# Patient Record
Sex: Female | Born: 1950 | Race: White | Hispanic: No | Marital: Single | Smoking: Former smoker
Health system: Southern US, Community
[De-identification: ages and names within clinical notes are randomized; demographics above are authoritative.]

## PROBLEM LIST (undated history)

## (undated) DIAGNOSIS — N183 Chronic kidney disease, stage 3 unspecified: Secondary | ICD-10-CM

## (undated) DIAGNOSIS — E119 Type 2 diabetes mellitus without complications: Secondary | ICD-10-CM

## (undated) DIAGNOSIS — M1990 Unspecified osteoarthritis, unspecified site: Secondary | ICD-10-CM

## (undated) DIAGNOSIS — D649 Anemia, unspecified: Secondary | ICD-10-CM

## (undated) DIAGNOSIS — C773 Secondary and unspecified malignant neoplasm of axilla and upper limb lymph nodes: Secondary | ICD-10-CM

## (undated) DIAGNOSIS — N951 Menopausal and female climacteric states: Secondary | ICD-10-CM

## (undated) DIAGNOSIS — I10 Essential (primary) hypertension: Secondary | ICD-10-CM

## (undated) DIAGNOSIS — C50919 Malignant neoplasm of unspecified site of unspecified female breast: Secondary | ICD-10-CM

## (undated) DIAGNOSIS — C4490 Unspecified malignant neoplasm of skin, unspecified: Secondary | ICD-10-CM

## (undated) HISTORY — DX: Secondary and unspecified malignant neoplasm of axilla and upper limb lymph nodes: C77.3

## (undated) HISTORY — DX: Essential (primary) hypertension: I10

## (undated) HISTORY — PX: TONSILLECTOMY AND ADENOIDECTOMY: SUR1326

## (undated) HISTORY — DX: Unspecified osteoarthritis, unspecified site: M19.90

## (undated) HISTORY — DX: Menopausal and female climacteric states: N95.1

## (undated) HISTORY — DX: Malignant neoplasm of unspecified site of unspecified female breast: C50.919

## (undated) HISTORY — DX: Unspecified malignant neoplasm of skin, unspecified: C44.90

---

## 2000-01-22 ENCOUNTER — Other Ambulatory Visit: Admission: RE | Admit: 2000-01-22 | Discharge: 2000-01-22 | Payer: Self-pay | Admitting: Obstetrics and Gynecology

## 2000-01-25 ENCOUNTER — Encounter: Admission: RE | Admit: 2000-01-25 | Discharge: 2000-01-25 | Payer: Self-pay | Admitting: Obstetrics and Gynecology

## 2000-01-25 ENCOUNTER — Encounter: Payer: Self-pay | Admitting: Obstetrics and Gynecology

## 2000-02-08 ENCOUNTER — Other Ambulatory Visit: Admission: RE | Admit: 2000-02-08 | Discharge: 2000-02-08 | Payer: Self-pay | Admitting: Obstetrics and Gynecology

## 2000-05-13 HISTORY — PX: MOHS SURGERY: SUR867

## 2004-05-13 DIAGNOSIS — I10 Essential (primary) hypertension: Secondary | ICD-10-CM

## 2004-05-13 HISTORY — DX: Essential (primary) hypertension: I10

## 2004-10-23 ENCOUNTER — Other Ambulatory Visit: Admission: RE | Admit: 2004-10-23 | Discharge: 2004-10-23 | Payer: Self-pay | Admitting: Obstetrics and Gynecology

## 2004-10-26 ENCOUNTER — Encounter: Admission: RE | Admit: 2004-10-26 | Discharge: 2004-10-26 | Payer: Self-pay | Admitting: *Deleted

## 2011-06-21 ENCOUNTER — Other Ambulatory Visit: Payer: Self-pay | Admitting: Physician Assistant

## 2011-07-29 ENCOUNTER — Other Ambulatory Visit: Payer: Self-pay | Admitting: Physician Assistant

## 2011-08-27 ENCOUNTER — Other Ambulatory Visit: Payer: Self-pay | Admitting: Physician Assistant

## 2011-11-05 ENCOUNTER — Encounter: Payer: Self-pay | Admitting: Physician Assistant

## 2011-11-05 ENCOUNTER — Ambulatory Visit (INDEPENDENT_AMBULATORY_CARE_PROVIDER_SITE_OTHER): Payer: BC Managed Care – PPO | Admitting: Physician Assistant

## 2011-11-05 VITALS — BP 156/89 | HR 93 | Temp 97.7°F | Resp 16 | Ht 63.5 in | Wt 187.6 lb

## 2011-11-05 DIAGNOSIS — E119 Type 2 diabetes mellitus without complications: Secondary | ICD-10-CM

## 2011-11-05 DIAGNOSIS — I10 Essential (primary) hypertension: Secondary | ICD-10-CM

## 2011-11-05 LAB — COMPREHENSIVE METABOLIC PANEL
ALT: 36 U/L — ABNORMAL HIGH (ref 0–35)
AST: 21 U/L (ref 0–37)
Albumin: 4.6 g/dL (ref 3.5–5.2)
Alkaline Phosphatase: 68 U/L (ref 39–117)
BUN: 16 mg/dL (ref 6–23)
Calcium: 9.3 mg/dL (ref 8.4–10.5)
Chloride: 105 mEq/L (ref 96–112)
Creat: 0.86 mg/dL (ref 0.50–1.10)
Potassium: 4.2 mEq/L (ref 3.5–5.3)

## 2011-11-05 LAB — CBC
MCHC: 35.2 g/dL (ref 30.0–36.0)
Platelets: 216 10*3/uL (ref 150–400)
RDW: 13.8 % (ref 11.5–15.5)
WBC: 6.1 10*3/uL (ref 4.0–10.5)

## 2011-11-05 LAB — POCT URINALYSIS DIPSTICK
Bilirubin, UA: NEGATIVE
Blood, UA: NEGATIVE
Glucose, UA: 500
Ketones, UA: 40
Nitrite, UA: NEGATIVE
Spec Grav, UA: 1.02

## 2011-11-05 LAB — LIPID PANEL
HDL: 32 mg/dL — ABNORMAL LOW (ref >39)
LDL Cholesterol: 143 mg/dL — ABNORMAL HIGH (ref 0–99)
Total CHOL/HDL Ratio: 7.5 Ratio

## 2011-11-05 LAB — GLUCOSE, POCT (MANUAL RESULT ENTRY): POC Glucose: 300 mg/dl — AB (ref 70–99)

## 2011-11-05 MED ORDER — METFORMIN HCL 1000 MG PO TABS: 1000.0000 mg | ORAL_TABLET | Freq: Two times a day (BID) | ORAL | Status: DC

## 2011-11-05 MED ORDER — SITAGLIPTIN PHOSPHATE 100 MG PO TABS: 100.0000 mg | ORAL_TABLET | Freq: Every day | ORAL | Status: DC

## 2011-11-05 MED ORDER — LISINOPRIL-HYDROCHLOROTHIAZIDE 20-25 MG PO TABS: 1.0000 | ORAL_TABLET | Freq: Every day | ORAL | Status: DC

## 2011-11-05 NOTE — Progress Notes (Signed)
  Subjective:    Patient ID: Gina Navarro, female    DOB: [birth year], 61 y.o.   MRN: [id number]  HPI Anai Lipson" comes in today for follow up.  It has been ~ 6 months since her last visit as she has had multiple life stressors.  She has had to care for her elderly mother who is 3 hours away and has been driving every week to be there.  Her mother was just diagnosed with liver CA as well. She states that she is completely out of her medications as she has tried multiple times to have them refilled but was told she needs to be seen.  She was so frustrated that she ran out and was unable to come in.  She was given an RX for her metformin for 1 month from a family friend who is a physician but her BS have been significantly elevated.  She feels light headed and "fuzzy" but has not had n/v, weight loss, diarrhea, abdominal pain.  She is very stressed and frustrated.    Review of Systems As noted in HPI, otherwise negative    Objective:   Physical Exam  Constitutional: She is oriented to person, place, and time. Vital signs are normal. She does not appear ill.  HENT:  Mouth/Throat: Oropharynx is clear and moist.  Eyes: No scleral icterus.  Cardiovascular: Normal rate and regular rhythm.   Pulmonary/Chest: Effort normal and breath sounds normal.  Abdominal: Normal appearance and bowel sounds are normal. There is no tenderness.  Lymphadenopathy:    She has no cervical adenopathy.  Neurological: She is alert and oriented to person, place, and time.  Skin: Skin is warm. She is not diaphoretic.        Assessment & Plan:  DM, uncontrolled HTN, uncontrolled  Check cmet, lipids, HGB A1C Restart meds Contact me with BS readings in 2 weeks.  To ER if symptoms change. Discussed [place] but agreed that she needs to be in a better situation for follow up before we start any new regimen. She will restart meds and can call me for phone follow up while she is busy caring for her mother.

## 2011-11-06 ENCOUNTER — Encounter: Payer: Self-pay | Admitting: Physician Assistant

## 2011-11-28 LAB — HM PAP SMEAR: HM Pap smear: NEGATIVE

## 2012-01-28 ENCOUNTER — Other Ambulatory Visit: Payer: Self-pay | Admitting: Physician Assistant

## 2012-01-30 ENCOUNTER — Other Ambulatory Visit: Payer: Self-pay | Admitting: Physician Assistant

## 2012-04-28 ENCOUNTER — Ambulatory Visit (INDEPENDENT_AMBULATORY_CARE_PROVIDER_SITE_OTHER): Payer: BC Managed Care – PPO | Admitting: Physician Assistant

## 2012-04-28 ENCOUNTER — Encounter: Payer: Self-pay | Admitting: Physician Assistant

## 2012-04-28 VITALS — BP 136/79 | HR 100 | Temp 98.1°F | Resp 18 | Ht 64.0 in | Wt 182.0 lb

## 2012-04-28 DIAGNOSIS — I10 Essential (primary) hypertension: Secondary | ICD-10-CM

## 2012-04-28 DIAGNOSIS — E119 Type 2 diabetes mellitus without complications: Secondary | ICD-10-CM

## 2012-04-28 DIAGNOSIS — E785 Hyperlipidemia, unspecified: Secondary | ICD-10-CM | POA: Insufficient documentation

## 2012-04-28 DIAGNOSIS — E1142 Type 2 diabetes mellitus with diabetic polyneuropathy: Secondary | ICD-10-CM | POA: Insufficient documentation

## 2012-04-28 LAB — COMPREHENSIVE METABOLIC PANEL
ALT: 58 U/L — ABNORMAL HIGH (ref 0–35)
AST: 29 U/L (ref 0–37)
Albumin: 4.5 g/dL (ref 3.5–5.2)
Calcium: 9.5 mg/dL (ref 8.4–10.5)
Chloride: 101 mEq/L (ref 96–112)
Potassium: 4.6 mEq/L (ref 3.5–5.3)
Sodium: 136 mEq/L (ref 135–145)
Total Protein: 7 g/dL (ref 6.0–8.3)

## 2012-04-28 LAB — POCT GLYCOSYLATED HEMOGLOBIN (HGB A1C): Hemoglobin A1C: 10.8

## 2012-04-28 LAB — GLUCOSE, POCT (MANUAL RESULT ENTRY): POC Glucose: 218 mg/dl — AB (ref 70–99)

## 2012-04-28 LAB — LIPID PANEL: Total CHOL/HDL Ratio: 5.6 Ratio

## 2012-04-28 MED ORDER — METFORMIN HCL 1000 MG PO TABS: 1000.0000 mg | ORAL_TABLET | Freq: Two times a day (BID) | ORAL | Status: DC

## 2012-04-28 MED ORDER — COLESEVELAM HCL 625 MG PO TABS: 1875.0000 mg | ORAL_TABLET | Freq: Two times a day (BID) | ORAL | Status: DC

## 2012-04-28 MED ORDER — SITAGLIPTIN PHOSPHATE 100 MG PO TABS: 100.0000 mg | ORAL_TABLET | Freq: Every day | ORAL | Status: DC

## 2012-04-28 MED ORDER — LISINOPRIL-HYDROCHLOROTHIAZIDE 20-25 MG PO TABS: 1.0000 | ORAL_TABLET | Freq: Every day | ORAL | Status: DC

## 2012-04-28 NOTE — Patient Instructions (Addendum)
Healthy eating and regular exercise are key.  So is taking your medications daily as prescribed.

## 2012-04-28 NOTE — Assessment & Plan Note (Signed)
Uncontrolled. Add Welchol to address both glucose and lipids (intolerant to statins).  If not to goal in 3 months, plan D/C Januvia and starting Victoza. Stressed the importance of lifestyle changes.

## 2012-04-28 NOTE — Progress Notes (Signed)
Subjective:    Patient ID: Gina Navarro, female    DOB: Sep 13, 1950, 61 y.o.   MRN: [id number]  HPI This 61 y.o. female presents for evaluation of her chronic medical problems: Patient Active Problem List  Diagnosis  . Type II or unspecified type diabetes mellitus without mention of complication, not stated as uncontrolled  . HTN (hypertension)  . Hyperlipidemia LDL goal <70   She hasn't been making healthy eating choices or getting much exercise, though knowing she needs to.  Her mother (adoptive) died this fall, and during her decline, the patient was too busy caring for her to make her own health a priority.  Now that she has more time, she is re-motivated to make some healthy changes.  She started metformin reluctantly, and notes a 50 point improvement in her glucose readings since starting Januvia.  She continues to refuse anything that may cause weight gain, including insulin, but is not opposed to an injectable other than insulin.  Frequency of home glucose monitoring: QD-BID; fasting 200's. Sees a dentist every few years, eye specialist annually (03-06-12). Checks feet daily. Is current with influenza vaccine. Is current with pneumococcal vaccine.  Past Medical History  Diagnosis Date  . Hypertension   . Diabetes mellitus without complication   . Arthritis     Past Surgical History  Procedure Date  . Tonsillectomy and adenoidectomy   . Mohs surgery     chin x 2    Prior to Admission medications   Medication Sig Start Date End Date Taking? Authorizing Provider  lisinopril-hydrochlorothiazide (PRINZIDE,ZESTORETIC) 20-25 MG per tablet Take 1 tablet by mouth daily. 11/05/11  Yes Pattricia Boss, PA-C  metFORMIN (GLUCOPHAGE) 1000 MG tablet TAKE 1 TABLET (1,000 MG TOTAL) BY MOUTH 2 (TWO) TIMES DAILY WITH A MEAL. 01/28/12  Yes Sarah Harvie Bridge, PA-C  sitaGLIPtin (JANUVIA) 100 MG tablet Take 1 tablet (100 mg total) by mouth daily. 11/05/11  Yes Pattricia Boss, PA-C    Allergies   Allergen Reactions  . Statins Other (See Comments)    MYALGIAS with Crestor, Lipitor, Zocor    History   Social History  . Marital Status: Single    Spouse Name: n/a    Number of Children: 0  . Years of Education: 17   Occupational History  . Psychologist (PhD)    Social History Main Topics  . Smoking status: Former Games developer  . Smokeless tobacco: Never Used  . Alcohol Use: No  . Drug Use: No  . Sexually Active: Yes -- Female partner(s)   Other Topics Concern  . Not on file   Social History Narrative   Adoptive mother died in 2012/03/06. Lives alone.    Family History  Problem Relation Age of Onset  . Adopted: Yes  . Family history unknown: Yes    Review of Systems Denies chest pain, shortness of breath, HA,  vision change, nausea, vomiting, diarrhea, constipation, melena, hematochezia, dysuria, increased hunger or thirst, unintentional weight change, unexplained myalgias or arthralgias, rash.  Increased urinary urgency/frequency and dizziness when glucose is elevated.    Objective:   Physical Exam Blood pressure 136/79, pulse 100, temperature 98.1 F (36.7 C), resp. rate 18, height 5\' 4"  (1.626 m), weight 182 lb (82.555 kg). Body mass index is 31.24 kg/(m^2). Well-developed, well nourished WF who is awake, alert and oriented, in NAD. HEENT: [place]/AT, sclera and conjunctiva are clear.   Neck: supple, non-tender, no lymphadenopathy, thyromegaly. Heart: RRR, no murmur Lungs: normal effort, CTA Extremities: no cyanosis,  clubbing or edema.  See diabetic foot exam. Skin: warm and dry without rash. Psychologic: good mood and appropriate affect, normal speech and behavior.  Results for orders placed in visit on 04/28/12  GLUCOSE, POCT (MANUAL RESULT ENTRY)      Component Value Range   POC Glucose 218 (*) 70 - 99 mg/dl  POCT GLYCOSYLATED HEMOGLOBIN (HGB A1C)      Component Value Range   Hemoglobin A1C 10.8        Assessment & Plan:   1. Type II or unspecified type  diabetes mellitus without mention of complication, not stated as uncontrolled  POCT glucose (manual entry), POCT glycosylated hemoglobin (Hb A1C), Comprehensive metabolic panel, Microalbumin, urine, sitaGLIPtin (JANUVIA) 100 MG tablet, metFORMIN (GLUCOPHAGE) 1000 MG tablet, colesevelam (WELCHOL) 625 MG tablet  2. HTN (hypertension)  lisinopril-hydrochlorothiazide (PRINZIDE,ZESTORETIC) 20-25 MG per tablet  3. Hyperlipidemia LDL goal <70  Lipid panel, colesevelam (WELCHOL) 625 MG tablet   RTC 3 months.  Additional health maintenance needed: colonoscopy, Zostavax.

## 2012-04-29 LAB — MICROALBUMIN, URINE: Microalb, Ur: 2.21 mg/dL — ABNORMAL HIGH (ref 0.00–1.89)

## 2012-05-05 ENCOUNTER — Encounter: Payer: Self-pay | Admitting: Physician Assistant

## 2012-08-04 ENCOUNTER — Encounter: Payer: Self-pay | Admitting: Physician Assistant

## 2012-08-04 ENCOUNTER — Ambulatory Visit (INDEPENDENT_AMBULATORY_CARE_PROVIDER_SITE_OTHER): Payer: BC Managed Care – PPO | Admitting: Physician Assistant

## 2012-08-04 VITALS — BP 121/78 | HR 89 | Temp 98.0°F | Resp 16 | Ht 63.0 in | Wt 186.0 lb

## 2012-08-04 DIAGNOSIS — I10 Essential (primary) hypertension: Secondary | ICD-10-CM

## 2012-08-04 DIAGNOSIS — E785 Hyperlipidemia, unspecified: Secondary | ICD-10-CM

## 2012-08-04 DIAGNOSIS — Z1211 Encounter for screening for malignant neoplasm of colon: Secondary | ICD-10-CM

## 2012-08-04 DIAGNOSIS — E119 Type 2 diabetes mellitus without complications: Secondary | ICD-10-CM

## 2012-08-04 LAB — LIPID PANEL
Cholesterol: 193 mg/dL (ref 0–200)
HDL: 38 mg/dL — ABNORMAL LOW (ref >39)
LDL Cholesterol: 91 mg/dL (ref 0–99)
Triglycerides: 321 mg/dL — ABNORMAL HIGH (ref <150)
VLDL: 64 mg/dL — ABNORMAL HIGH (ref 0–40)

## 2012-08-04 LAB — COMPREHENSIVE METABOLIC PANEL
Alkaline Phosphatase: 45 U/L (ref 39–117)
BUN: 19 mg/dL (ref 6–23)
CO2: 23 mEq/L (ref 19–32)
Creat: 0.9 mg/dL (ref 0.50–1.10)
Glucose, Bld: 180 mg/dL — ABNORMAL HIGH (ref 70–99)
Sodium: 138 mEq/L (ref 135–145)
Total Bilirubin: 1 mg/dL (ref 0.3–1.2)
Total Protein: 6.7 g/dL (ref 6.0–8.3)

## 2012-08-04 LAB — GLUCOSE, POCT (MANUAL RESULT ENTRY): POC Glucose: 166 mg/dl — AB (ref 70–99)

## 2012-08-04 MED ORDER — LIRAGLUTIDE 18 MG/3ML ~~LOC~~ SOLN: 0.6000 mg | Freq: Every day | SUBCUTANEOUS | Status: DC

## 2012-08-04 NOTE — Patient Instructions (Signed)
Keep up the great work.  Start walking again.  Make healthy eating choices.

## 2012-08-04 NOTE — Assessment & Plan Note (Signed)
Much improved.  A1C down from 10.8 to 8.3.  Add Victoza.  Consider reduce or D/C Januvia, and then reduce or D/C Welchol (due to constipation and reported fluid retention).

## 2012-08-04 NOTE — Progress Notes (Signed)
  Subjective:    Patient ID: Gina Navarro, female    DOB: 1950-09-14, 62 y.o.   MRN: [id number]  HPI This 62 y.o. female presents for evaluation of DM type 2, HTN, and hyperlipidemia. Is pleased with the results of the Welchol, except that it's also caused constipation and fluid retention. Using fiber pills and cranberry extract with improvement.  Frequency of home glucose monitoring: Daily.  Runs high 100's, down from high 200's pre-Welchol. Sees a dentist Q6 months, eye specialist annually. Checks feet daily. Is current with influenza vaccine. Is current with pneumococcal vaccine.   Review of Systems Denies chest pain, shortness of breath, HA, dizziness, vision change, nausea, vomiting, diarrhea, melena, hematochezia, dysuria, increased urinary urgency or frequency, increased hunger or thirst, unintentional weight change, unexplained myalgias or arthralgias, rash.     Objective:   Physical Exam Blood pressure 121/78, pulse 89, temperature 98 F (36.7 C), temperature source Oral, resp. rate 16, height 5\' 3"  (1.6 m), weight 186 lb (84.369 kg), SpO2 98.00%. Body mass index is 32.96 kg/(m^2). Well-developed, well nourished WF who is awake, alert and oriented, in NAD. HEENT: [place]/AT, slera and conjunctiva are clear.   Neck: supple, non-tender, no lymphadenopathy, thyromegaly. Heart: RRR, no murmur Lungs: normal effort, CTA Extremities: no cyanosis, clubbing or edema. Skin: warm and dry without rash. Psychologic: good mood and appropriate affect, normal speech and behavior.    Results for orders placed in visit on 08/04/12  GLUCOSE, POCT (MANUAL RESULT ENTRY)      Result Value Range   POC Glucose 166 (*) 70 - 99 mg/dl  POCT GLYCOSYLATED HEMOGLOBIN (HGB A1C)      Result Value Range   Hemoglobin A1C 8.3         Assessment & Plan:  Type II or unspecified type diabetes mellitus without mention of complication, not stated as uncontrolled - Much improved .Plan: POCT glucose (manual  entry), POCT glycosylated hemoglobin (Hb A1C), Comprehensive metabolic panel, Liraglutide (VICTOZA) 18 MG/3ML SOLN injection. Hope to see continued improvement.  Will plan to decrease (and D/C) Januvia, but elected not to stop it now, due to adverse effects during initiation and hopes of avoiding the need to stop-start it and revisit those.  Also consider reducing or D/C Welchol, depending on the effect.  She's reminded to resume her former healthy eating and exercise habits.  Hyperlipidemia LDL goal <70 - Plan: Lipid panel  HTN (hypertension) - stable.  Plan: continue current treatment.  Screening for colon cancer - Plan: Ambulatory referral to Gastroenterology

## 2012-08-05 ENCOUNTER — Encounter: Payer: Self-pay | Admitting: Internal Medicine

## 2012-08-30 ENCOUNTER — Other Ambulatory Visit: Payer: Self-pay | Admitting: Physician Assistant

## 2012-08-31 ENCOUNTER — Encounter: Payer: Self-pay | Admitting: Radiology

## 2012-09-24 ENCOUNTER — Encounter: Payer: Self-pay | Admitting: Internal Medicine

## 2012-09-26 ENCOUNTER — Other Ambulatory Visit: Payer: Self-pay | Admitting: Physician Assistant

## 2012-09-29 ENCOUNTER — Other Ambulatory Visit: Payer: Self-pay | Admitting: Physician Assistant

## 2012-10-08 ENCOUNTER — Other Ambulatory Visit: Payer: Self-pay | Admitting: Physician Assistant

## 2012-10-09 ENCOUNTER — Telehealth: Payer: Self-pay | Admitting: Radiology

## 2012-10-09 NOTE — Telephone Encounter (Signed)
Welchol is costly $300 there is generic alternative Questran which would be more cost effective. Please advise.

## 2012-10-11 MED ORDER — CHOLESTYRAMINE 4 GM/DOSE PO POWD: 4.0000 g | Freq: Three times a day (TID) | ORAL | Status: DC

## 2012-10-11 NOTE — Telephone Encounter (Signed)
Rx changed to Questran.  Meds ordered this encounter  Medications  . cholestyramine (QUESTRAN) 4 GM/DOSE powder    Sig: Take 1 packet (4 g total) by mouth 3 (three) times daily with meals.    Dispense:  378 g    Refill:  2    Order Specific Question:  Supervising Provider    Answer:  DOOLITTLE, ROBERT P [[id number]]

## 2012-10-12 NOTE — Telephone Encounter (Signed)
lmom that rx was changed and sent to pharmacy

## 2012-11-10 ENCOUNTER — Ambulatory Visit (INDEPENDENT_AMBULATORY_CARE_PROVIDER_SITE_OTHER): Payer: BC Managed Care – PPO | Admitting: Physician Assistant

## 2012-11-10 ENCOUNTER — Encounter: Payer: Self-pay | Admitting: Physician Assistant

## 2012-11-10 VITALS — BP 138/72 | HR 105 | Temp 98.2°F | Resp 16 | Ht 63.5 in | Wt 172.2 lb

## 2012-11-10 DIAGNOSIS — E119 Type 2 diabetes mellitus without complications: Secondary | ICD-10-CM

## 2012-11-10 DIAGNOSIS — E785 Hyperlipidemia, unspecified: Secondary | ICD-10-CM

## 2012-11-10 DIAGNOSIS — I10 Essential (primary) hypertension: Secondary | ICD-10-CM

## 2012-11-10 LAB — COMPREHENSIVE METABOLIC PANEL
ALT: 46 U/L — ABNORMAL HIGH (ref 0–35)
AST: 25 U/L (ref 0–37)
Albumin: 4.8 g/dL (ref 3.5–5.2)
BUN: 17 mg/dL (ref 6–23)
Calcium: 9.9 mg/dL (ref 8.4–10.5)
Chloride: 104 mEq/L (ref 96–112)
Potassium: 4.3 mEq/L (ref 3.5–5.3)
Sodium: 138 mEq/L (ref 135–145)
Total Protein: 7.1 g/dL (ref 6.0–8.3)

## 2012-11-10 LAB — GLUCOSE, POCT (MANUAL RESULT ENTRY): POC Glucose: 100 mg/dl — AB (ref 70–99)

## 2012-11-10 LAB — POCT GLYCOSYLATED HEMOGLOBIN (HGB A1C): Hemoglobin A1C: 6

## 2012-11-10 MED ORDER — GLUCOSE BLOOD VI STRP: ORAL_STRIP | Status: DC

## 2012-11-10 MED ORDER — LIRAGLUTIDE 18 MG/3ML ~~LOC~~ SOPN: 1.2000 mg | PEN_INJECTOR | Freq: Every day | SUBCUTANEOUS | Status: DC

## 2012-11-10 NOTE — Telephone Encounter (Signed)
Pended the needles not sure how many review and sign

## 2012-11-10 NOTE — Patient Instructions (Addendum)
STOP the Januvia. INCREASE the Victoza to 1.2 mg daily. Contact me with the information about the pen needles and I will send a prescription to the pharmacy for you.  I will contact you with your lab results as soon as they are available.   If you have not heard from me in 2 weeks, please contact me.  The fastest way to get your results is to register for My Chart (see the instructions on the last page of this printout).

## 2012-11-10 NOTE — Progress Notes (Signed)
  Subjective:    Patient ID: Gina Navarro, female    DOB: [birth year], 62 y.o.   MRN: [id number]  HPI  This 62 y.o. female presents for evaluation of DM, HTN, hyperlipidemia.  Changed from Welchol to [place] (pharmacy benefit coverage) but hasn't started it yet. Recall that she had significant constipation on the [facility], and so enjoyed a return to normal bowel habits, she didn't want to start the Questran.  Is tolerating the Victoza well, and would like to increase the dose, in hopes of reducing/stopping the Januvia.  Frequency of home glucose monitoring: QAM 120-140's Sees a dentist Q6 months, eye specialist annually. Checks feet daily. Is current with influenza vaccine. Is current with pneumococcal vaccine.   Review of Systems Denies chest pain, shortness of breath, HA, dizziness, vision change, nausea, vomiting, diarrhea, constipation, melena, hematochezia, dysuria, increased hunger or thirst, unintentional weight change, unexplained myalgias or arthralgias, rash. Some longstanding urinary urgency and frequency, not increased.  Some cramping of the LEFT foot during the day x 4 weeks.  Occurs at rest, when she's sitting in her recliner.  Usually around the toes, sometimes up along the arch.  Rubbing on it seems to help. Eating a spoonful of mustard usually helps.  Occurs every few days.  Last 1-2 minutes.     Objective:   Physical Exam Blood pressure 138/72, pulse 105, temperature 98.2 F (36.8 C), temperature source Oral, resp. rate 16, height 5' 3.5" (1.613 m), weight 172 lb 3.2 oz (78.109 kg), SpO2 98.00%. Body mass index is 30.02 kg/(m^2). Well-developed, well nourished WF who is awake, alert and oriented, in NAD. HEENT: [place]/AT, sclera and conjunctiva are clear.   Neck: supple, non-tender, no lymphadenopathy, thyromegaly. Heart: RRR, no murmur Lungs: normal effort, CTA Abdomen: normo-active bowel sounds, supple, non-tender, no mass or organomegaly. Extremities: no cyanosis,  clubbing or edema. Skin: warm and dry without rash. Psychologic: good mood and appropriate affect, normal speech and behavior.  See DM foot exam  Results for orders placed in visit on 11/10/12  GLUCOSE, POCT (MANUAL RESULT ENTRY)      Result Value Range   POC Glucose 100 (*) 70 - 99 mg/dl  POCT GLYCOSYLATED HEMOGLOBIN (HGB A1C)      Result Value Range   Hemoglobin A1C 6.0         Assessment & Plan:  Type II or unspecified type diabetes mellitus without mention of complication, not stated as uncontrolled - Plan: glucose blood test strip, POCT glucose (manual entry), POCT glycosylated hemoglobin (Hb A1C), Comprehensive metabolic panel, Microalbumin, urine, Liraglutide (VICTOZA) 18 MG/3ML SOPN  Hyperlipidemia LDL goal <70 - Plan: Lipid panel  HTN (hypertension) - controlled.  Continue current treatment.   RTC 3 months, sooner if needed.  Fernande Bras, PA-C Physician Assistant-Certified [facility]

## 2012-11-11 LAB — MICROALBUMIN, URINE: Microalb, Ur: 1.3 mg/dL (ref 0.00–1.89)

## 2012-11-12 MED ORDER — INSULIN PEN NEEDLE 31G X 8 MM MISC: Status: DC

## 2012-11-25 ENCOUNTER — Other Ambulatory Visit: Payer: Self-pay | Admitting: Physician Assistant

## 2012-11-26 ENCOUNTER — Encounter: Payer: Self-pay | Admitting: *Deleted

## 2012-12-01 ENCOUNTER — Ambulatory Visit (INDEPENDENT_AMBULATORY_CARE_PROVIDER_SITE_OTHER): Payer: BC Managed Care – PPO | Admitting: Nurse Practitioner

## 2012-12-01 ENCOUNTER — Encounter: Payer: Self-pay | Admitting: Nurse Practitioner

## 2012-12-01 VITALS — BP 114/66 | HR 72 | Resp 14 | Ht 63.75 in | Wt 171.2 lb

## 2012-12-01 DIAGNOSIS — Z01419 Encounter for gynecological examination (general) (routine) without abnormal findings: Secondary | ICD-10-CM

## 2012-12-01 DIAGNOSIS — Z0000 Encounter for general adult medical examination without abnormal findings: Secondary | ICD-10-CM

## 2012-12-01 NOTE — Patient Instructions (Addendum)

## 2012-12-01 NOTE — Progress Notes (Signed)
62 y.o. G0P0 Single Caucasian Fe here for annual exam.  No new health diagnosis.  Now on Victoza and has lost over 10 lbs.  No LMP recorded. Patient is postmenopausal.          Sexually active: yes  The current method of family planning is post menopausal status.    Exercising: no  The patient does not participate in regular exercise at present. Smoker:  no  Health Maintenance: Pap:  11/28/2011  Normal with negative HR HPV MMG:  03/25/2012 normal Colonoscopy:  never BMD:   never TDaP:  2009 Labs: PCP maintains all labs and urine.    reports that she has quit smoking. She has never used smokeless tobacco. She reports that she does not drink alcohol or use illicit drugs.  Past Medical History  Diagnosis Date  . Hypertension   . Diabetes mellitus without complication   . Arthritis   . Menopausal state     Past Surgical History  Procedure Laterality Date  . Tonsillectomy and adenoidectomy    . Mohs surgery      chin x 2    Current Outpatient Prescriptions  Medication Sig Dispense Refill  . cholecalciferol (VITAMIN D) 1000 UNITS tablet Take 1,000 Units by mouth daily.      . Evening Primrose topical oil Apply topically as needed for dry skin.      . fish oil-omega-3 fatty acids 1000 MG capsule Take 2 g by mouth daily.      Marland Kitchen glucosamine-chondroitin 500-400 MG tablet Take 1 tablet by mouth 3 (three) times daily.      Marland Kitchen glucose blood test strip Use as instructed  100 each  4  . Liraglutide (VICTOZA) 18 MG/3ML SOPN Inject 1.2 mg into the skin daily.  9 mL  3  . lisinopril-hydrochlorothiazide (PRINZIDE,ZESTORETIC) 20-25 MG per tablet TAKE 1 TABLET BY MOUTH DAILY.  90 tablet  0  . metFORMIN (GLUCOPHAGE) 1000 MG tablet TAKE 1 TABLET (1,000 MG TOTAL) BY MOUTH 2 (TWO) TIMES DAILY WITH A MEAL.  60 tablet  0   No current facility-administered medications for this visit.    Family History  Problem Relation Age of Onset  . Adopted: Yes    ROS:  Pertinent items are noted in HPI.   Otherwise, a comprehensive ROS was negative.  Exam:   BP 114/66  Pulse 72  Resp 14  Ht 5' 3.75" (1.619 m)  Wt 171 lb 3.2 oz (77.656 kg)  BMI 29.63 kg/m2 Height: 5' 3.75" (161.9 cm)  Ht Readings from Last 3 Encounters:  12/01/12 5' 3.75" (1.619 m)  11/10/12 5' 3.5" (1.613 m)  08/04/12 5\' 3"  (1.6 m)    General appearance: alert, cooperative and appears stated age Head: Normocephalic, without obvious abnormality, atraumatic Neck: no adenopathy, supple, symmetrical, trachea midline and thyroid normal to inspection and palpation Lungs: clear to auscultation bilaterally Breasts: normal appearance, no masses or tenderness Heart: regular rate and rhythm Abdomen: soft, non-tender; no masses,  no organomegaly Extremities: extremities normal, atraumatic, no cyanosis or edema Skin: Skin color, texture, turgor normal. No rashes or lesions Lymph nodes: Cervical, supraclavicular, and axillary nodes normal. No abnormal inguinal nodes palpated Neurologic: Grossly normal   Pelvic: External genitalia:  no lesions              Urethra:  normal appearing urethra with no masses, tenderness or lesions              Bartholin's and Skene's: normal  Vagina: normal appearing vagina with normal color and discharge, no lesions              Cervix: anteverted              Pap taken: no Bimanual Exam:  Uterus:  normal size, contour, position, consistency, mobility, non-tender              Adnexa: no mass, fullness, tenderness               Rectovaginal: Confirms               Anus:  normal sphincter tone, no lesions  A:  Well Woman with normal exam  Postmenopausal no HRT  Not sexually active  DM, HTN  P:   Pap smear as per guidelines   Mammogram due 11/14  IFOB test   She is overdue on colonoscopy but had to cancel and reschedule several times  due to mothers illness and now passing.  PCP is trying to get rescheduled.  Counseled on breast self exam, adequate intake of calcium and  vitamin D,   diet and exercise return annually or prn  An After Visit Summary was printed and given to the patient.

## 2012-12-06 NOTE — Progress Notes (Signed)
Encounter reviewed by Dr. Tiwanna Tuch Silva.  

## 2013-01-28 ENCOUNTER — Other Ambulatory Visit: Payer: Self-pay | Admitting: Hematology & Oncology

## 2013-02-16 ENCOUNTER — Ambulatory Visit (INDEPENDENT_AMBULATORY_CARE_PROVIDER_SITE_OTHER): Payer: BC Managed Care – PPO | Admitting: Emergency Medicine

## 2013-02-16 ENCOUNTER — Encounter: Payer: Self-pay | Admitting: Physician Assistant

## 2013-02-16 VITALS — BP 100/70 | HR 88 | Temp 98.3°F | Resp 16 | Ht 63.5 in | Wt 170.0 lb

## 2013-02-16 DIAGNOSIS — E785 Hyperlipidemia, unspecified: Secondary | ICD-10-CM

## 2013-02-16 DIAGNOSIS — R0609 Other forms of dyspnea: Secondary | ICD-10-CM

## 2013-02-16 DIAGNOSIS — R079 Chest pain, unspecified: Secondary | ICD-10-CM

## 2013-02-16 DIAGNOSIS — I10 Essential (primary) hypertension: Secondary | ICD-10-CM

## 2013-02-16 DIAGNOSIS — I358 Other nonrheumatic aortic valve disorders: Secondary | ICD-10-CM

## 2013-02-16 DIAGNOSIS — E119 Type 2 diabetes mellitus without complications: Secondary | ICD-10-CM

## 2013-02-16 DIAGNOSIS — Z23 Encounter for immunization: Secondary | ICD-10-CM

## 2013-02-16 DIAGNOSIS — R0600 Dyspnea, unspecified: Secondary | ICD-10-CM

## 2013-02-16 LAB — COMPREHENSIVE METABOLIC PANEL
ALT: 20 U/L (ref 0–35)
AST: 16 U/L (ref 0–37)
Alkaline Phosphatase: 52 U/L (ref 39–117)
BUN: 18 mg/dL (ref 6–23)
Chloride: 99 mEq/L (ref 96–112)
Creat: 0.83 mg/dL (ref 0.50–1.10)
Total Bilirubin: 0.9 mg/dL (ref 0.3–1.2)

## 2013-02-16 LAB — LIPID PANEL
Cholesterol: 232 mg/dL — ABNORMAL HIGH (ref 0–200)
Total CHOL/HDL Ratio: 5.9 Ratio

## 2013-02-16 LAB — CBC WITH DIFFERENTIAL/PLATELET
Basophils Absolute: 0 10*3/uL (ref 0.0–0.1)
Basophils Relative: 0 % (ref 0–1)
Eosinophils Relative: 1 % (ref 0–5)
HCT: 38.4 % (ref 36.0–46.0)
Lymphocytes Relative: 38 % (ref 12–46)
MCHC: 34.9 g/dL (ref 30.0–36.0)
MCV: 84.6 fL (ref 78.0–100.0)
Monocytes Absolute: 0.4 10*3/uL (ref 0.1–1.0)
Neutro Abs: 3.7 10*3/uL (ref 1.7–7.7)
Platelets: 260 10*3/uL (ref 150–400)
RDW: 12.8 % (ref 11.5–15.5)
WBC: 6.8 10*3/uL (ref 4.0–10.5)

## 2013-02-16 LAB — POCT GLYCOSYLATED HEMOGLOBIN (HGB A1C): Hemoglobin A1C: 6.2

## 2013-02-16 MED ORDER — ZOSTER VACCINE LIVE 19400 UNT/0.65ML ~~LOC~~ SOLR: 0.6500 mL | Freq: Once | SUBCUTANEOUS | Status: DC

## 2013-02-16 MED ORDER — NITROGLYCERIN 0.4 MG SL SUBL: 0.4000 mg | SUBLINGUAL_TABLET | SUBLINGUAL | Status: AC | PRN

## 2013-02-16 NOTE — Patient Instructions (Addendum)
We will refer you to cardiology and get an appointment, likely this week. Until you see the cardiologist, please avoid exertion.  If you chest pain, shortness of breath or nausea during exercise or exertion, take a Nitroglycerin and call [phone]

## 2013-02-16 NOTE — Progress Notes (Signed)
Subjective:    Patient ID: Gina Navarro, female    DOB: [birth year], 62 y.o.   MRN: [id number]  HPI  Ms. Gina Navarro is a 62 year old female who is here for a follow up on her DM, HTN, and hyperlipidemia. She has been doing well since we last saw her, still taking her meds and watching her diet. She is walking most days of the week. Her BP has been running in the130s/70s and blood sugar in the 120s in the mornings.   In September, she was at the beach and had a few episodes of chest pain. She has ongoing fatigue and SOB with exertion for awhile now. But at the beach, if she was walking up and down stairs, moving stuff into the house, really pushing herself, she developed increased shortness of breath, nausea, and chest pain. Chest pain did not radiate but was in middle of chest. No clamminess or diaphoresis noted at that time and does not remember palpitations with the episodes. Once she sat down and rest, the pain and nausea resolved. Episodes last a few minutes at a time. She has not had any more episodes of chest pain since that trip but she has increased dyspnea on exertion with occasional nausea. She also complains of pain in calves and buttocks that has increased with walking- it used to be just going up hills. Pain resolves when she stops walking. She has not had an EKG and has never been seen by Cardio. Family history is unknown-adopted. She has a remote history of smoking in her college years but not since that time.    She also complains of increased numbness and loss of sensation in her feet since increasing the Victoza dose.   She denies headaches, dizziness, blurred vision, or vision changes. No swelling or edema.   Review of Systems    as above Objective:   Physical Exam  Constitutional: She is oriented to person, place, and time. She appears well-developed and well-nourished. No distress.  Cardiovascular: Normal rate, regular rhythm and intact distal pulses.   Murmur (short  I-IIsystolic murmur heard best over base) heard. Pulmonary/Chest: Effort normal and breath sounds normal.  Musculoskeletal: She exhibits no edema.  Neurological: She is alert and oriented to person, place, and time.  Abnormal diabetic foot exam- loss of vibratory sensation and loss of pin prick sensation b/l to ankle  Skin: She is not diaphoretic.  Psychiatric: She has a normal mood and affect.     BP 100/70  Pulse 88  Temp(Src) 98.3 F (36.8 C) (Oral)  Resp 16  Ht 5' 3.5" (1.613 m)  Wt 170 lb (77.111 kg)  BMI 29.64 kg/m2  SpO2 98%  EKG reading by Dr. Cleta Alberts: there are no previous tracings available for comparison, normal sinus rhythm, small Q waves in II, III, and aVF and possible Q waves in V4-V5.      Assessment & Plan:  Need for prophylactic vaccination and inoculation against influenza - Plan: Flu Vaccine QUAD 36+ mos IM  Type II or unspecified type diabetes mellitus without mention of complication, not stated as uncontrolled - Plan: HM Diabetes Foot Exam, POCT glucose (manual entry), POCT glycosylated hemoglobin (Hb A1C), Comprehensive metabolic panel  Dyspnea - Plan: EKG 12-Lead, Ambulatory referral to Cardiology  Hyperlipidemia LDL goal <70 - Plan: Lipid panel  HTN (hypertension) - Plan: CBC with Differential  Need for shingles vaccine - Plan: zoster vaccine live, PF, (ZOSTAVAX) 40981 UNT/0.65ML injection  Heart murmur, aortic  Chest  pain - Plan: Ambulatory referral to Cardiology, nitroGLYCERIN (NITROSTAT) 0.4 MG SL tablet Limit activity until seen by Cardiology. If develop chest pain, take nitro and call 911 immediately.

## 2013-02-16 NOTE — Progress Notes (Signed)
I have examined this patient along with the student and agree.  EKG reviewed with Dr. Cleta Alberts.  Small Q-waves noted.  Patient is currently asymptomatic, but is at a higher risk of cardiovascular event given diabetes.  Will address the increased paresthesias at her next visit here, once the cardiology evaluation is completed.

## 2013-02-26 ENCOUNTER — Other Ambulatory Visit: Payer: Self-pay | Admitting: Hematology & Oncology

## 2013-03-23 ENCOUNTER — Telehealth: Payer: Self-pay | Admitting: Radiology

## 2013-03-23 DIAGNOSIS — I6521 Occlusion and stenosis of right carotid artery: Secondary | ICD-10-CM

## 2013-03-23 NOTE — Telephone Encounter (Signed)
Dr Cleta Alberts wants patient to have [place] with Dr Jacinto Halim in 6 months. Patient aware, this has been ordered.

## 2013-03-25 ENCOUNTER — Other Ambulatory Visit: Payer: Self-pay | Admitting: Physician Assistant

## 2013-04-05 ENCOUNTER — Other Ambulatory Visit: Payer: Self-pay | Admitting: Physician Assistant

## 2013-05-05 ENCOUNTER — Encounter: Payer: Self-pay | Admitting: Nurse Practitioner

## 2013-05-25 ENCOUNTER — Encounter: Payer: Self-pay | Admitting: Physician Assistant

## 2013-05-25 ENCOUNTER — Ambulatory Visit (INDEPENDENT_AMBULATORY_CARE_PROVIDER_SITE_OTHER): Payer: BC Managed Care – PPO | Admitting: Physician Assistant

## 2013-05-25 VITALS — BP 128/64 | HR 98 | Temp 98.1°F | Resp 16 | Ht 64.0 in | Wt 175.0 lb

## 2013-05-25 DIAGNOSIS — E785 Hyperlipidemia, unspecified: Secondary | ICD-10-CM

## 2013-05-25 DIAGNOSIS — G629 Polyneuropathy, unspecified: Secondary | ICD-10-CM

## 2013-05-25 DIAGNOSIS — I10 Essential (primary) hypertension: Secondary | ICD-10-CM

## 2013-05-25 DIAGNOSIS — E119 Type 2 diabetes mellitus without complications: Secondary | ICD-10-CM

## 2013-05-25 DIAGNOSIS — G609 Hereditary and idiopathic neuropathy, unspecified: Secondary | ICD-10-CM

## 2013-05-25 LAB — LIPID PANEL
Cholesterol: 127 mg/dL (ref 0–200)
HDL: 41 mg/dL (ref >39)
LDL Cholesterol: 50 mg/dL (ref 0–99)
Total CHOL/HDL Ratio: 3.1 Ratio
Triglycerides: 181 mg/dL — ABNORMAL HIGH (ref <150)
VLDL: 36 mg/dL (ref 0–40)

## 2013-05-25 LAB — CBC WITH DIFFERENTIAL/PLATELET
BASOS ABS: 0 10*3/uL (ref 0.0–0.1)
Basophils Relative: 0 % (ref 0–1)
EOS ABS: 0.1 10*3/uL (ref 0.0–0.7)
Eosinophils Relative: 2 % (ref 0–5)
HCT: 35.5 % — ABNORMAL LOW (ref 36.0–46.0)
Hemoglobin: 12.3 g/dL (ref 12.0–15.0)
Lymphocytes Relative: 30 % (ref 12–46)
Lymphs Abs: 1.9 10*3/uL (ref 0.7–4.0)
MCH: 29.7 pg (ref 26.0–34.0)
MCHC: 34.6 g/dL (ref 30.0–36.0)
MCV: 85.7 fL (ref 78.0–100.0)
Monocytes Absolute: 0.3 10*3/uL (ref 0.1–1.0)
Monocytes Relative: 5 % (ref 3–12)
NEUTROS PCT: 63 % (ref 43–77)
Neutro Abs: 4.1 10*3/uL (ref 1.7–7.7)
PLATELETS: 185 10*3/uL (ref 150–400)
RBC: 4.14 MIL/uL (ref 3.87–5.11)
RDW: 13.7 % (ref 11.5–15.5)
WBC: 6.4 10*3/uL (ref 4.0–10.5)

## 2013-05-25 LAB — COMPLETE METABOLIC PANEL WITH GFR
ALT: 18 U/L (ref 0–35)
AST: 14 U/L (ref 0–37)
Albumin: 4.3 g/dL (ref 3.5–5.2)
Alkaline Phosphatase: 52 U/L (ref 39–117)
BILIRUBIN TOTAL: 0.8 mg/dL (ref 0.3–1.2)
BUN: 16 mg/dL (ref 6–23)
CO2: 25 meq/L (ref 19–32)
CREATININE: 0.85 mg/dL (ref 0.50–1.10)
Calcium: 9.8 mg/dL (ref 8.4–10.5)
Chloride: 102 mEq/L (ref 96–112)
GFR, EST AFRICAN AMERICAN: 86 mL/min
GFR, Est Non African American: 74 mL/min
GLUCOSE: 106 mg/dL — AB (ref 70–99)
Potassium: 4.3 mEq/L (ref 3.5–5.3)
Sodium: 140 mEq/L (ref 135–145)
Total Protein: 6.5 g/dL (ref 6.0–8.3)

## 2013-05-25 LAB — TSH: TSH: 2.303 u[IU]/mL (ref 0.350–4.500)

## 2013-05-25 LAB — VITAMIN B12: VITAMIN B 12: 385 pg/mL (ref 211–911)

## 2013-05-25 LAB — FOLATE: Folate: >20 ng/mL

## 2013-05-25 LAB — POCT GLYCOSYLATED HEMOGLOBIN (HGB A1C): HEMOGLOBIN A1C: 6.2

## 2013-05-25 LAB — GLUCOSE, POCT (MANUAL RESULT ENTRY): POC GLUCOSE: 114 mg/dL — AB (ref 70–99)

## 2013-05-25 NOTE — Patient Instructions (Signed)
Keep up the great work. Try Miralax to soften your stools as needed.

## 2013-05-25 NOTE — Progress Notes (Signed)
   Subjective:    Patient ID: Gina Navarro, female    DOB: 10-Dec-1950, 63 y.o.   MRN: [id number]  HPI  [name] is a 63 year old female who here today for follow up on her DM, HTN and hyperlipidemia. She is taking all medications as prescribed. Checks blood pressure at home. Usually about 130/70.  Checks glucose regularly at home every morning. Usually about 120.  Has noticed numbness and tingling in feet. First noticed when started taking Victoza and seems to have gotten worse.  Bruising on big toe nails biltaerally that began mid-October. Reports no trauma or injury to her toes. The bruising seems to be getting lighter. She has not changed shoes or had changes in nail care.   No vision changes, headaches, dizziness, lightheadness, chest pain or palpitations. Lopressor is helping SOB, considerably better than last year.Reports constipation she attributes to Victoza.Takes fiber pills every day but does not seem to help. Has not had BM in a few days. Continues to experiences urinary frequency but has not worsened.   Review of Systems As above.    Objective:   Physical Exam  Constitutional: She is oriented to person, place, and time. Vital signs are normal. She appears well-developed and well-nourished.  HENT:  Head: Normocephalic and atraumatic.  Eyes:  Fundoscopic exam:      The right eye shows no AV nicking, no exudate and no hemorrhage. The right eye shows red reflex.       The left eye shows no AV nicking, no exudate and no hemorrhage. The left eye shows red reflex.  Cardiovascular: Normal rate, regular rhythm, normal heart sounds and intact distal pulses.   Pulmonary/Chest: Effort normal and breath sounds normal.  Neurological: She is alert and oriented to person, place, and time.  Skin: Skin is warm and dry.  Psychiatric: She has a normal mood and affect. Her behavior is normal. Thought content normal.    Results for orders placed in visit on 05/25/13  GLUCOSE, POCT  (MANUAL RESULT ENTRY)      Result Value Range   POC Glucose 114 (*) 70 - 99 mg/dl  POCT GLYCOSYLATED HEMOGLOBIN (HGB A1C)      Result Value Range   Hemoglobin A1C 6.2           Assessment & Plan:   1. Type II or unspecified type diabetes mellitus with mention of complication, not stated as uncontrolled Glucose is well controlled. Continue making Victoza and Metformin as prescribed. Victoza is known to cause constipation (and diarrhea) so recommended Miralax for constipation.  - POCT glucose (manual entry) - POCT glycosylated hemoglobin (Hb A1C)  2. HTN (hypertension) Continue taking Lisinopril-HCTZ as prescribed.  - COMPLETE METABOLIC PANEL WITH GFR  3. Hyperlipidemia LDL goal <70 Results of lipid panel pending.  Continue taking Lipitor as prescribed.  - Lipid panel  4. Peripheral neuropathy Ordered labs to determine cause of peripheral neuropathy. If all labs are normal, DM may be cause and will continue to monitor DM. If neuropathy worsens, refer to neurologist.  - TSH - Vitamin B12 - Folate - CBC with Differential

## 2013-05-25 NOTE — Progress Notes (Signed)
I have examined this patient along with the student and agree.  

## 2013-06-21 ENCOUNTER — Other Ambulatory Visit: Payer: Self-pay | Admitting: Physician Assistant

## 2013-07-11 ENCOUNTER — Other Ambulatory Visit: Payer: Self-pay | Admitting: Physician Assistant

## 2013-07-24 ENCOUNTER — Other Ambulatory Visit: Payer: Self-pay | Admitting: Physician Assistant

## 2013-08-31 ENCOUNTER — Encounter: Payer: Self-pay | Admitting: Physician Assistant

## 2013-08-31 ENCOUNTER — Ambulatory Visit (INDEPENDENT_AMBULATORY_CARE_PROVIDER_SITE_OTHER): Payer: BC Managed Care – PPO | Admitting: Physician Assistant

## 2013-08-31 VITALS — BP 113/70 | HR 81 | Temp 98.7°F | Resp 16 | Ht 64.25 in | Wt 179.4 lb

## 2013-08-31 DIAGNOSIS — E785 Hyperlipidemia, unspecified: Secondary | ICD-10-CM

## 2013-08-31 DIAGNOSIS — R0989 Other specified symptoms and signs involving the circulatory and respiratory systems: Secondary | ICD-10-CM

## 2013-08-31 DIAGNOSIS — E1143 Type 2 diabetes mellitus with diabetic autonomic (poly)neuropathy: Secondary | ICD-10-CM

## 2013-08-31 DIAGNOSIS — R079 Chest pain, unspecified: Secondary | ICD-10-CM | POA: Insufficient documentation

## 2013-08-31 DIAGNOSIS — E1149 Type 2 diabetes mellitus with other diabetic neurological complication: Secondary | ICD-10-CM

## 2013-08-31 DIAGNOSIS — E782 Mixed hyperlipidemia: Secondary | ICD-10-CM

## 2013-08-31 DIAGNOSIS — I10 Essential (primary) hypertension: Secondary | ICD-10-CM

## 2013-08-31 DIAGNOSIS — I6523 Occlusion and stenosis of bilateral carotid arteries: Secondary | ICD-10-CM | POA: Insufficient documentation

## 2013-08-31 LAB — LIPID PANEL
CHOL/HDL RATIO: 5.8 ratio
Cholesterol: 256 mg/dL — ABNORMAL HIGH (ref 0–200)
HDL: 44 mg/dL (ref >39)
LDL CALC: 144 mg/dL — AB (ref 0–99)
TRIGLYCERIDES: 340 mg/dL — AB (ref <150)
VLDL: 68 mg/dL — ABNORMAL HIGH (ref 0–40)

## 2013-08-31 LAB — COMPLETE METABOLIC PANEL WITH GFR
ALK PHOS: 53 U/L (ref 39–117)
ALT: 25 U/L (ref 0–35)
AST: 20 U/L (ref 0–37)
Albumin: 4.9 g/dL (ref 3.5–5.2)
BUN: 17 mg/dL (ref 6–23)
CO2: 21 mEq/L (ref 19–32)
Calcium: 10 mg/dL (ref 8.4–10.5)
Chloride: 102 mEq/L (ref 96–112)
Creat: 0.93 mg/dL (ref 0.50–1.10)
GFR, EST NON AFRICAN AMERICAN: 67 mL/min
GFR, Est African American: 77 mL/min
Glucose, Bld: 107 mg/dL — ABNORMAL HIGH (ref 70–99)
Potassium: 4.2 mEq/L (ref 3.5–5.3)
Sodium: 137 mEq/L (ref 135–145)
Total Bilirubin: 0.6 mg/dL (ref 0.2–1.2)
Total Protein: 7.6 g/dL (ref 6.0–8.3)

## 2013-08-31 LAB — POCT GLYCOSYLATED HEMOGLOBIN (HGB A1C): Hemoglobin A1C: 6.3

## 2013-08-31 LAB — GLUCOSE, POCT (MANUAL RESULT ENTRY): POC GLUCOSE: 97 mg/dL (ref 70–99)

## 2013-08-31 MED ORDER — LIRAGLUTIDE 18 MG/3ML ~~LOC~~ SOPN: PEN_INJECTOR | SUBCUTANEOUS | Status: DC

## 2013-08-31 MED ORDER — ATORVASTATIN CALCIUM 20 MG PO TABS: 20.0000 mg | ORAL_TABLET | Freq: Every day | ORAL | Status: DC

## 2013-08-31 MED ORDER — METOPROLOL TARTRATE 25 MG PO TABS: 25.0000 mg | ORAL_TABLET | Freq: Two times a day (BID) | ORAL | Status: DC

## 2013-08-31 MED ORDER — METFORMIN HCL 1000 MG PO TABS: ORAL_TABLET | ORAL | Status: DC

## 2013-08-31 MED ORDER — LISINOPRIL-HYDROCHLOROTHIAZIDE 20-25 MG PO TABS: ORAL_TABLET | ORAL | Status: DC

## 2013-08-31 NOTE — Progress Notes (Signed)
I have examined this patient along with the student and agree.  Return in about 3 months (around 11/30/2013) for re-evaluation.  

## 2013-08-31 NOTE — Patient Instructions (Addendum)
Keep up the great work!  I will contact you with your lab results as soon as they are available.   If you have not heard from me in 2 weeks, please contact me.  The fastest way to get your results is to register for My Chart (see the instructions on the last page of this printout).

## 2013-08-31 NOTE — Progress Notes (Signed)
Subjective:    Patient ID: Jory Ee, female    DOB: [birth year], 63 y.o.   MRN: [id number]  HPI   62y.o female with hx of HTN, and DM presents for 4 month checkup for DM.  Pt checks sugar most mornings an d it runs around 120 but never more than 130.  She sees an eye doctor regularly.    Pt saw her cardiologist Dr. Einar Gip in November and has another appointment in early May.  Dr. Einar Gip diagnosed her with bilat carotid stenosis more than 50% as well as exertional chest pain.  Pt reports a decrease in chest discomfort with exertion since starting lopressor.  Stress test calculated EF to be 62%.  Pt is also concerned with her 9 lb weight gain since 02/2013.  Says she is walking the same amount, if not more than usual.  Has not changed diet.  Denies cough, SOB, peripheral edema.  Pt states she will be outside walking even more now that it is almost summer.   Review of Systems  Constitutional: Negative for fever, chills and diaphoresis.  HENT: Negative.   Eyes: Negative.   Respiratory: Negative.   Cardiovascular: Negative.   Gastrointestinal: Negative.   Endocrine: Negative.   Genitourinary: Negative.   Musculoskeletal: Negative.   Skin: Negative for color change and rash.  Allergic/Immunologic: Negative.   Neurological: Negative.   Hematological: Negative.        Objective:   Physical Exam  Constitutional: She is oriented to person, place, and time. She appears well-developed and well-nourished. No distress.  BP 113/70  Pulse 81  Temp(Src) 98.7 F (37.1 C) (Oral)  Resp 16  Ht 5' 4.25" (1.632 m)  Wt 179 lb 6.4 oz (81.375 kg)  BMI 30.55 kg/m2  SpO2 98%    HENT:  Head: Normocephalic.  Eyes: Conjunctivae are normal. Pupils are equal, round, and reactive to light.  Neck: Carotid bruit is present (R).  Cardiovascular: Normal rate, regular rhythm and intact distal pulses.   Pulmonary/Chest: Effort normal and breath sounds normal.  Lymphadenopathy:    She has no cervical  adenopathy.  Neurological: She is alert and oriented to person, place, and time.  Skin: Skin is warm and dry. No rash noted.  Psychiatric: She has a normal mood and affect. Her behavior is normal.   See diabetic foot exam  Results for orders placed in visit on 08/31/13  GLUCOSE, POCT (MANUAL RESULT ENTRY)      Result Value Ref Range   POC Glucose 97  70 - 99 mg/dl  POCT GLYCOSYLATED HEMOGLOBIN (HGB A1C)      Result Value Ref Range   Hemoglobin A1C 6.3          Assessment & Plan:   1. Hyperlipidemia LDL goal <70 Awaiting lab results.  Pt will continue taking atorvastatin. - COMPLETE METABOLIC PANEL WITH GFR - Lipid panel - atorvastatin (LIPITOR) 20 MG tablet; Take 1 tablet (20 mg total) by mouth daily.  Dispense: 90 tablet; Refill: 3  2. Diabetes mellitus with peripheral autonomic neuropathy Glucose 97 and A1C 6.3 in office today.  Will continue to monitor.  Pt will follow up with office visit in 3 months. - POCT glucose (manual entry) - POCT glycosylated hemoglobin (Hb A1C) - lisinopril-hydrochlorothiazide (PRINZIDE,ZESTORETIC) 20-25 MG per tablet; TAKE 1 TABLET BY MOUTH DAILY.  Dispense: 90 tablet; Refill: 3 - metFORMIN (GLUCOPHAGE) 1000 MG tablet; TAKE 1 TABLET (1,000 MG TOTAL) BY MOUTH 2 (TWO) TIMES DAILY WITH A MEAL.  Dispense:  180 tablet; Refill: 3 - Liraglutide (VICTOZA) 18 MG/3ML SOPN; INJECT 1.2 MG INTO THE SKIN DAILY.  Dispense: 9 mL; Refill: 3 - HM Diabetes Foot Exam  3. HTN (hypertension) Continue current treatment plan. - metoprolol tartrate (LOPRESSOR) 25 MG tablet; Take 1 tablet (25 mg total) by mouth 2 (two) times daily.  Dispense: 180 tablet; Refill: 3  4. Right carotid bruit 5. Exertional chest pain Will be monitored by her cardiologist Dr. Einar Gip.  Follow up appointment in 2 weeks.

## 2013-09-02 ENCOUNTER — Encounter: Payer: Self-pay | Admitting: Physician Assistant

## 2013-09-02 ENCOUNTER — Other Ambulatory Visit: Payer: Self-pay | Admitting: Physician Assistant

## 2013-09-02 DIAGNOSIS — Z1211 Encounter for screening for malignant neoplasm of colon: Secondary | ICD-10-CM

## 2013-09-07 NOTE — Telephone Encounter (Signed)
Patient notified via My Chart.  Orders Placed This Encounter  Procedures  . IFOBT POC (occult bld, rslt in office)    Please give patient kit for home collection.    Standing Status: Future     Number of Occurrences:      Standing Expiration Date: 10/07/2013

## 2013-09-28 ENCOUNTER — Encounter: Payer: Self-pay | Admitting: Physician Assistant

## 2013-09-28 ENCOUNTER — Other Ambulatory Visit: Payer: Self-pay | Admitting: Emergency Medicine

## 2013-09-28 DIAGNOSIS — I6523 Occlusion and stenosis of bilateral carotid arteries: Secondary | ICD-10-CM

## 2013-11-24 ENCOUNTER — Telehealth: Payer: Self-pay | Admitting: Family Medicine

## 2013-11-24 NOTE — Telephone Encounter (Signed)
Calling pt for lipid and DM f/u. Pt will see Chelle for DM and she has a cardiologist that is monitoring her cholesterol.

## 2013-12-03 ENCOUNTER — Ambulatory Visit: Payer: BC Managed Care – PPO | Admitting: Nurse Practitioner

## 2013-12-07 ENCOUNTER — Ambulatory Visit (INDEPENDENT_AMBULATORY_CARE_PROVIDER_SITE_OTHER): Payer: BC Managed Care – PPO | Admitting: Nurse Practitioner

## 2013-12-07 ENCOUNTER — Encounter: Payer: Self-pay | Admitting: Nurse Practitioner

## 2013-12-07 VITALS — BP 108/74 | HR 72 | Ht 63.25 in | Wt 182.0 lb

## 2013-12-07 DIAGNOSIS — I10 Essential (primary) hypertension: Secondary | ICD-10-CM

## 2013-12-07 DIAGNOSIS — Z01419 Encounter for gynecological examination (general) (routine) without abnormal findings: Secondary | ICD-10-CM

## 2013-12-07 NOTE — Progress Notes (Addendum)
Patient ID: Gina Navarro, female   DOB: Oct 26, 1950, 63 y.o.   MRN: [id number] 63 y.o. "[name]" G0P0 WS Fe here for annual exam.  No vaso symptoms.  Lat HGB AIC is normal and Victoza is working well.  No chest pain but at times gets pre anginal pain. No other pain. Same partner.  Patient's last menstrual period was 05/13/1996.          Sexually active: yes  The current method of family planning is post menopausal status.  Exercising: yes, walking 4-5 times per week  Smoker: no   Health Maintenance:  Pap: 11/28/2011 Normal with negative HR HPV  MMG: 05/19/13, 3D normal Colonoscopy: never;  IFOB done late June 2015 BMD: never  TDaP: 11/05/2007  Labs:  PCP takes care of labs, in EPIC     Past Medical History  Diagnosis Date  . Arthritis   . Menopausal state   . Hypertension 2006  . Diabetes mellitus without complication 4098    oral meds until Victoza in 07/2012    Past Surgical History  Procedure Laterality Date  . Tonsillectomy and adenoidectomy    . Mohs surgery  2002    chin x 1 and right forehead X 1 in 2004    Current Outpatient Prescriptions  Medication Sig Dispense Refill  . B-D ULTRAFINE III SHORT PEN 31G X 8 MM MISC USE AS DIRECTED  100 each  3  . cholecalciferol (VITAMIN D) 1000 UNITS tablet Take 1,000 Units by mouth daily.      . CRESTOR 20 MG tablet Take 1 tablet by mouth daily.      . Evening Primrose topical oil Apply topically as needed for dry skin.      . fish oil-omega-3 fatty acids 1000 MG capsule Take 2 g by mouth daily.      Marland Kitchen glucosamine-chondroitin 500-400 MG tablet Take 1 tablet by mouth 3 (three) times daily.      Marland Kitchen glucose blood test strip Use as instructed  100 each  4  . Liraglutide (VICTOZA) 18 MG/3ML SOPN INJECT 1.2 MG INTO THE SKIN DAILY.  9 mL  3  . lisinopril-hydrochlorothiazide (PRINZIDE,ZESTORETIC) 20-25 MG per tablet TAKE 1 TABLET BY MOUTH DAILY.  90 tablet  3  . metFORMIN (GLUCOPHAGE) 1000 MG tablet TAKE 1 TABLET (1,000 MG TOTAL) BY MOUTH 2  (TWO) TIMES DAILY WITH A MEAL.  180 tablet  3  . metoprolol tartrate (LOPRESSOR) 25 MG tablet Take 1 tablet (25 mg total) by mouth 2 (two) times daily.  180 tablet  3  . nitroGLYCERIN (NITROSTAT) 0.4 MG SL tablet Place 1 tablet (0.4 mg total) under the tongue every 5 (five) minutes as needed for chest pain.  25 tablet  1  . OVER THE COUNTER MEDICATION Take 1 tablet by mouth 2 (two) times daily. Co Q 10/L Carnitine 100/1000      . Turmeric Curcumin 500 MG CAPS Take 1 capsule by mouth 2 (two) times daily.       No current facility-administered medications for this visit.    Family History  Problem Relation Age of Onset  . Adopted: Yes  . Liver cancer Mother   . Brain cancer Father   . Lung cancer Mother     ROS:  Pertinent items are noted in HPI.  Otherwise, a comprehensive ROS was negative.  Exam:   BP 108/74  Pulse 72  Ht 5' 3.25" (1.607 m)  Wt 182 lb (82.555 kg)  BMI 31.97 kg/m2  LMP  05/13/1996 Height: 5' 3.25" (160.7 cm)  Ht Readings from Last 3 Encounters:  12/07/13 5' 3.25" (1.607 m)  08/31/13 5' 4.25" (1.632 m)  05/25/13 5\' 4"  (1.626 m)    General appearance: alert, cooperative and appears stated age Head: Normocephalic, without obvious abnormality, atraumatic Neck: no adenopathy, supple, symmetrical, trachea midline and thyroid normal to inspection and palpation Lungs: clear to auscultation bilaterally Breasts: normal appearance, no masses or tenderness Heart: regular rate and rhythm Abdomen: soft, non-tender; no masses,  no organomegaly Extremities: extremities normal, atraumatic, no cyanosis or edema Skin: Skin color, texture, turgor normal. No rashes or lesions Lymph nodes: Cervical, supraclavicular, and axillary nodes normal. No abnormal inguinal nodes palpated Neurologic: Grossly normal   Pelvic: External genitalia:  no lesions              Urethra:  normal appearing urethra with no masses, tenderness or lesions              Bartholin's and Skene's: normal                  Vagina: normal appearing vagina with normal color and discharge, no lesions              Cervix: anteverted              Pap taken: No. Bimanual Exam:  Uterus:  normal size, contour, position, consistency, mobility, non-tender              Adnexa: no mass, fullness, tenderness               Rectovaginal: Confirms               Anus:  normal sphincter tone, no lesions  A:  Well Woman with normal exam  History of menopause, DM, HTN    P:   Reviewed health and wellness pertinent to exam  Pap smear not taken today  Mammogram is due 05/2014  Counseled on breast self exam, mammography screening, adequate intake of calcium and vitamin D, diet and exercise, Kegel's exercises return annually or prn  An After Visit Summary was printed and given to the patient.

## 2013-12-07 NOTE — Patient Instructions (Signed)

## 2013-12-09 NOTE — Progress Notes (Signed)
Encounter reviewed by Dr. Davaun Quintela Silva.  

## 2013-12-14 ENCOUNTER — Ambulatory Visit: Payer: BC Managed Care – PPO | Admitting: Physician Assistant

## 2013-12-30 ENCOUNTER — Encounter: Payer: Self-pay | Admitting: Physician Assistant

## 2013-12-30 DIAGNOSIS — E785 Hyperlipidemia, unspecified: Secondary | ICD-10-CM

## 2013-12-30 DIAGNOSIS — I6523 Occlusion and stenosis of bilateral carotid arteries: Secondary | ICD-10-CM

## 2014-01-25 ENCOUNTER — Other Ambulatory Visit: Payer: Self-pay | Admitting: Physician Assistant

## 2014-01-26 ENCOUNTER — Other Ambulatory Visit: Payer: Self-pay | Admitting: Physician Assistant

## 2014-02-09 ENCOUNTER — Telehealth: Payer: Self-pay

## 2014-02-09 NOTE — Telephone Encounter (Signed)
Clld pt - [place] of home phone to schedule Diabetic Care follow up appt.

## 2014-03-31 ENCOUNTER — Other Ambulatory Visit: Payer: Self-pay | Admitting: Physician Assistant

## 2014-04-04 ENCOUNTER — Other Ambulatory Visit: Payer: Self-pay | Admitting: Physician Assistant

## 2014-04-28 ENCOUNTER — Other Ambulatory Visit: Payer: Self-pay | Admitting: Physician Assistant

## 2014-05-02 ENCOUNTER — Other Ambulatory Visit: Payer: Self-pay | Admitting: Physician Assistant

## 2014-05-30 ENCOUNTER — Other Ambulatory Visit: Payer: Self-pay

## 2014-05-30 MED ORDER — METFORMIN HCL 1000 MG PO TABS: 1000.0000 mg | ORAL_TABLET | Freq: Two times a day (BID) | ORAL | Status: DC

## 2014-06-17 ENCOUNTER — Other Ambulatory Visit: Payer: Self-pay | Admitting: Physician Assistant

## 2014-06-17 MED ORDER — LISINOPRIL-HYDROCHLOROTHIAZIDE 20-25 MG PO TABS: ORAL_TABLET | ORAL | Status: DC

## 2014-06-17 MED ORDER — METFORMIN HCL 1000 MG PO TABS: 1000.0000 mg | ORAL_TABLET | Freq: Two times a day (BID) | ORAL | Status: DC

## 2014-06-17 MED ORDER — LIRAGLUTIDE 18 MG/3ML ~~LOC~~ SOPN: PEN_INJECTOR | SUBCUTANEOUS | Status: DC

## 2014-06-17 NOTE — Telephone Encounter (Signed)
Patient called to request an appointment with Harrison Mons, PA-C. Next available appointment for an established patient is 08/09/2014. Patient then stated that she needs a refill on Victoza, Metformin, and Lisinopril and states that she is completely out of her meds. Patient confirmed that she knows she needs an OV for these meds to be refilled. I let patient know that Chelle is at the walk in center today until 6pm and Saturday and Sunday 8-4pm if she needs her meds urgently. Patient stated that she is not going to wait for hours at the walk in center and that she wants [place] to give her a month and a half supply of her meds until she comes in for her appointment on 08/09/2014. Please advise.   [phone]

## 2014-06-17 NOTE — Telephone Encounter (Signed)
Chelle, pt last seen in April 2015, w/notes to RTC on all RFs since Nov. Pt does have appt sch for 08/09/14. OK to RF these until then?

## 2014-06-17 NOTE — Telephone Encounter (Signed)
Notified pt on VM that RFs were sent in through her appt.

## 2014-07-02 ENCOUNTER — Other Ambulatory Visit: Payer: Self-pay | Admitting: Physician Assistant

## 2014-07-25 ENCOUNTER — Other Ambulatory Visit: Payer: Self-pay | Admitting: Physician Assistant

## 2014-08-09 ENCOUNTER — Ambulatory Visit (INDEPENDENT_AMBULATORY_CARE_PROVIDER_SITE_OTHER): Payer: 59 | Admitting: Physician Assistant

## 2014-08-09 ENCOUNTER — Encounter: Payer: Self-pay | Admitting: Physician Assistant

## 2014-08-09 VITALS — BP 101/69 | HR 99 | Temp 97.7°F | Resp 16 | Ht 64.5 in | Wt 178.0 lb

## 2014-08-09 DIAGNOSIS — Z114 Encounter for screening for human immunodeficiency virus [HIV]: Secondary | ICD-10-CM | POA: Diagnosis not present

## 2014-08-09 DIAGNOSIS — I6523 Occlusion and stenosis of bilateral carotid arteries: Secondary | ICD-10-CM

## 2014-08-09 DIAGNOSIS — E119 Type 2 diabetes mellitus without complications: Secondary | ICD-10-CM

## 2014-08-09 DIAGNOSIS — I10 Essential (primary) hypertension: Secondary | ICD-10-CM

## 2014-08-09 DIAGNOSIS — E785 Hyperlipidemia, unspecified: Secondary | ICD-10-CM

## 2014-08-09 DIAGNOSIS — Z1211 Encounter for screening for malignant neoplasm of colon: Secondary | ICD-10-CM | POA: Diagnosis not present

## 2014-08-09 DIAGNOSIS — Z6830 Body mass index (BMI) 30.0-30.9, adult: Secondary | ICD-10-CM | POA: Insufficient documentation

## 2014-08-09 LAB — CBC WITH DIFFERENTIAL/PLATELET
Basophils Absolute: 0 10*3/uL (ref 0.0–0.1)
Basophils Relative: 0 % (ref 0–1)
EOS PCT: 1 % (ref 0–5)
Eosinophils Absolute: 0.1 10*3/uL (ref 0.0–0.7)
HEMATOCRIT: 36.2 % (ref 36.0–46.0)
HEMOGLOBIN: 12.8 g/dL (ref 12.0–15.0)
LYMPHS ABS: 1.9 10*3/uL (ref 0.7–4.0)
LYMPHS PCT: 25 % (ref 12–46)
MCH: 30 pg (ref 26.0–34.0)
MCHC: 35.4 g/dL (ref 30.0–36.0)
MCV: 84.8 fL (ref 78.0–100.0)
MONO ABS: 0.4 10*3/uL (ref 0.1–1.0)
MONOS PCT: 5 % (ref 3–12)
MPV: 8.8 fL (ref 8.6–12.4)
NEUTROS PCT: 69 % (ref 43–77)
Neutro Abs: 5.2 10*3/uL (ref 1.7–7.7)
Platelets: 293 10*3/uL (ref 150–400)
RBC: 4.27 MIL/uL (ref 3.87–5.11)
RDW: 13 % (ref 11.5–15.5)
WBC: 7.5 10*3/uL (ref 4.0–10.5)

## 2014-08-09 LAB — COMPREHENSIVE METABOLIC PANEL
ALBUMIN: 4.8 g/dL (ref 3.5–5.2)
ALK PHOS: 42 U/L (ref 39–117)
ALT: 45 U/L — AB (ref 0–35)
AST: 22 U/L (ref 0–37)
BILIRUBIN TOTAL: 1 mg/dL (ref 0.2–1.2)
BUN: 31 mg/dL — ABNORMAL HIGH (ref 6–23)
CHLORIDE: 95 meq/L — AB (ref 96–112)
CO2: 25 mEq/L (ref 19–32)
CREATININE: 1.1 mg/dL (ref 0.50–1.10)
Calcium: 10.2 mg/dL (ref 8.4–10.5)
Glucose, Bld: 101 mg/dL — ABNORMAL HIGH (ref 70–99)
Potassium: 4 mEq/L (ref 3.5–5.3)
SODIUM: 136 meq/L (ref 135–145)
Total Protein: 7.6 g/dL (ref 6.0–8.3)

## 2014-08-09 LAB — LIPID PANEL
Cholesterol: 259 mg/dL — ABNORMAL HIGH (ref 0–200)
HDL: 42 mg/dL — ABNORMAL LOW (ref >=46)
TRIGLYCERIDES: 433 mg/dL — AB (ref <150)
Total CHOL/HDL Ratio: 6.2 Ratio

## 2014-08-09 LAB — GLUCOSE, POCT (MANUAL RESULT ENTRY): POC Glucose: 93 mg/dl (ref 70–99)

## 2014-08-09 LAB — HEMOGLOBIN A1C
HEMOGLOBIN A1C: 6.9 % — AB (ref <5.7)
Mean Plasma Glucose: 151 mg/dL — ABNORMAL HIGH (ref <117)

## 2014-08-09 LAB — HIV ANTIBODY (ROUTINE TESTING W REFLEX): HIV 1&2 Ab, 4th Generation: NONREACTIVE

## 2014-08-09 LAB — TSH: TSH: 1.575 u[IU]/mL (ref 0.350–4.500)

## 2014-08-09 MED ORDER — CANAGLIFLOZIN 100 MG PO TABS: 100.0000 mg | ORAL_TABLET | Freq: Every day | ORAL | Status: DC

## 2014-08-09 NOTE — Progress Notes (Signed)
Patient ID: Gina Navarro, female    DOB: 10-21-1950, 64 y.o.   MRN: [id number]  PCP: Jenny Reichmann, MD  Subjective:   Chief Complaint  Patient presents with  . Follow-up  . Diabetes  . Hypertension    HPI  Presents for follow-up of DM and HTN.  Didn't click with cardiologist. Didn't feel like he listened to her, didn't read the test results correctly, didn't let me talk. "Broke up with him." Has therefore stopped Crestor and Lopressor, since the prescription ran out in about 03/2014. The Crestor was very expensive, and she thinks she was tolerating it better than she had previously (previously had myalgias). Feels like she's had weight gain, primarily while on the Lopressor, but even after stopping it.  In [place], at the beach, went for a morning walk and while climbing the stairs back to the condo became short-winded and had heart palpitations. Noticed that on the days she took Victoza, she was short-winded and had palpitations, but on the day that she forgot it, she didn't. Switched her dose to evening, and has had no further episodes. Also around that time, her insurance wasn't covering the Victoza well and she reduced the dose. Still gets a little out of breath if she over-exerts. No recurrent palpitations.  Home BP 130's/70's  Still having numbness, pins and needles in the feet, which started about the same time as she started Victoza. Is taking a vitamin B supplement which seems to help, and reducing the Victoza dose seemed to help as well. She's disappointed that she can't go for long walks on the beach due to the pain.  Home glucose 150 this AM. Has not improved as much as she expected when she increased the Victoza back up in 05/2014.  Review of Systems Review of Systems  As above. No CP, HA, Dizziness, nausea, vomiting, diarrhea.   Patient Active Problem List   Diagnosis Date Noted  . BMI 30.0-30.9,adult 08/09/2014  . Carotid stenosis, bilateral 08/31/2013  .  Exertional chest pain 08/31/2013  . Diabetes mellitus type 2, uncomplicated 22/63/3354  . HTN (hypertension) 04/28/2012  . Hyperlipidemia LDL goal <70 04/28/2012     Prior to Admission medications   Medication Sig Start Date End Date Taking? Authorizing Provider  B-D ULTRAFINE III SHORT PEN 31G X 8 MM MISC USE AS DIRECTED   Yes Mancel Bale, PA-C  cholecalciferol (VITAMIN D) 1000 UNITS tablet Take 1,000 Units by mouth daily.   Yes Historical Provider, MD  Evening Primrose topical oil Apply topically as needed for dry skin.   Yes Historical Provider, MD  fish oil-omega-3 fatty acids 1000 MG capsule Take 2 g by mouth daily.   Yes Historical Provider, MD  glucosamine-chondroitin 500-400 MG tablet Take 1 tablet by mouth 3 (three) times daily.   Yes Historical Provider, MD  glucose blood (ONE TOUCH ULTRA TEST) test strip USE AS INSTRUCTED.  "NEEDS A FOLLOW UP VISIT" 04/04/14  Yes Ardra Kuznicki S Chauntae Hults, PA-C  Liraglutide (VICTOZA) 18 MG/3ML SOPN INJECT 1.2 MG INTO THE SKIN DAILY. 06/17/14  Yes Karandeep Resende S Chenel Wernli, PA-C  lisinopril-hydrochlorothiazide (PRINZIDE,ZESTORETIC) 20-25 MG per tablet TAKE 1 TABLET BY MOUTH DAILY. 06/17/14  Yes Rajiv Parlato S Arianne Klinge, PA-C  metFORMIN (GLUCOPHAGE) 1000 MG tablet TAKE 1 TABLET (1,000 MG TOTAL) BY MOUTH 2 (TWO) TIMES DAILY WITH A MEAL. 07/26/14  Yes Yarethzi Branan S Pryce Folts, PA-C  nitroGLYCERIN (NITROSTAT) 0.4 MG SL tablet Place 1 tablet (0.4 mg total) under the tongue every 5 (five) minutes  as needed for chest pain. 02/16/13  Yes Liley Rake S Macai Sisneros, PA-C  OVER THE COUNTER MEDICATION Take 1 tablet by mouth 2 (two) times daily. Co Q 10/L Carnitine 100/1000   Yes Historical Provider, MD  Turmeric Curcumin 500 MG CAPS Take 1 capsule by mouth 2 (two) times daily.   Yes Historical Provider, MD     Allergies  Allergen Reactions  . Bee Venom   . Statins Other (See Comments)    MYALGIAS with Crestor, Zocor. Tolerating Lipitor (05/2013)       Objective:  Physical Exam  Physical Exam    Constitutional: She is oriented to person, place, and time. She appears well-developed and well-nourished. No distress.  BP 101/69 mmHg  Pulse 99  Temp(Src) 97.7 F (36.5 C)  Resp 16  Ht 5' 4.5" (1.638 m)  Wt 178 lb (80.74 kg)  BMI 30.09 kg/m2  SpO2 100%  LMP 05/13/1996   Eyes: Conjunctivae are normal. No scleral icterus.  Neck: No thyromegaly present.  Cardiovascular: Normal rate, regular rhythm, normal heart sounds and intact distal pulses.   Pulmonary/Chest: Effort normal and breath sounds normal.  Lymphadenopathy:    She has no cervical adenopathy.  Neurological: She is alert and oriented to person, place, and time.  Skin: Skin is warm and dry.  Psychiatric: She has a normal mood and affect. Her behavior is normal.   Diabetic Foot Exam - Simple   Simple Foot Form  Diabetic Foot exam was performed with the following findings:  Yes 08/09/2014  9:55 AM  Visual Inspection  Sensation Testing  Pulse Check  Comments  Diminished feeling in right foot, None in left foot.            Assessment & Plan:  1. Essential hypertension Very well controlled. Continue current regimen for now. If develops dizziness, consider reducing lisinoprilHCT dose. - CBC with Differential/Platelet - TSH  2. Diabetes mellitus type 2, uncomplicated Await H5K. Trial off Victoza. Use Invokana instead. If symptoms persist, plan starting Cymbalta (less weight gain than with gabapentin). - POCT glucose (manual entry) - Hemoglobin A1c - Comprehensive metabolic panel - canagliflozin (INVOKANA) 100 MG TABS tablet; Take 1 tablet (100 mg total) by mouth daily.  Dispense: 90 tablet; Refill: 3 - Microalbumin, urine  3. Hyperlipidemia LDL goal <70 Await lab results. May need to restart statin therapy. She's tolerated Crestor more recently. - Lipid panel  4. Carotid stenosis, bilateral Maintain BP and lipid control.  5. BMI 30.0-30.9,adult Healthy eating and regular exercise. Healthy lifestyle changes  will improve overall health.  6. Screening for colon cancer Not interested in colonoscopy. Agrees to screen with home iFOBT, and if positive, proceed with scope. - POC Hemoccult Bld/Stl (3-Cd Home Screen); Future  7. Screening for HIV (human immunodeficiency virus) - HIV antibody   Fara Chute, PA-C Physician Assistant-Certified [facility]  Try stopping Victoza. Try Invokana. If peripheral neuropathy doesn't improve, try Cymbalta (preferred over gabapentin for weight profile).

## 2014-08-09 NOTE — Patient Instructions (Addendum)
I will contact you with your lab results as soon as they are available.   If you have not heard from me in 2 weeks, please contact me.  The fastest way to get your results is to register for My Chart (see the instructions on the last page of this printout).   Stop the Victoza. Start Invokana. If you have continued numbness/ pain in your feet, we can add Cymbalta.

## 2014-08-10 ENCOUNTER — Telehealth: Payer: Self-pay

## 2014-08-10 LAB — MICROALBUMIN, URINE: MICROALB UR: 0.8 mg/dL (ref <2.0)

## 2014-08-10 NOTE — Telephone Encounter (Signed)
PA approved for Invokana.

## 2014-08-10 NOTE — Telephone Encounter (Signed)
PA started for Invokana.

## 2014-08-18 ENCOUNTER — Other Ambulatory Visit: Payer: Self-pay

## 2014-08-18 MED ORDER — LISINOPRIL-HYDROCHLOROTHIAZIDE 20-25 MG PO TABS: ORAL_TABLET | ORAL | Status: DC

## 2014-08-22 ENCOUNTER — Other Ambulatory Visit: Payer: Self-pay | Admitting: Physician Assistant

## 2014-08-29 ENCOUNTER — Encounter: Payer: Self-pay | Admitting: Physician Assistant

## 2014-08-29 MED ORDER — CANAGLIFLOZIN 300 MG PO TABS: 300.0000 mg | ORAL_TABLET | Freq: Every day | ORAL | Status: DC

## 2014-08-29 NOTE — Telephone Encounter (Signed)
Chelle, please pt's message. Pended Invokana at 300 mg QD dose w/ # of RFs that you put on original.

## 2014-10-11 ENCOUNTER — Other Ambulatory Visit: Payer: Self-pay | Admitting: Physician Assistant

## 2014-10-12 LAB — HM DIABETES EYE EXAM

## 2014-10-25 ENCOUNTER — Other Ambulatory Visit: Payer: Self-pay | Admitting: Physician Assistant

## 2014-11-08 ENCOUNTER — Ambulatory Visit (INDEPENDENT_AMBULATORY_CARE_PROVIDER_SITE_OTHER): Payer: 59 | Admitting: Physician Assistant

## 2014-11-08 ENCOUNTER — Encounter: Payer: Self-pay | Admitting: Physician Assistant

## 2014-11-08 VITALS — BP 102/68 | HR 79 | Temp 99.0°F | Resp 16 | Ht 63.5 in | Wt 178.0 lb

## 2014-11-08 DIAGNOSIS — E785 Hyperlipidemia, unspecified: Secondary | ICD-10-CM

## 2014-11-08 DIAGNOSIS — I10 Essential (primary) hypertension: Secondary | ICD-10-CM | POA: Diagnosis not present

## 2014-11-08 DIAGNOSIS — I6523 Occlusion and stenosis of bilateral carotid arteries: Secondary | ICD-10-CM | POA: Diagnosis not present

## 2014-11-08 DIAGNOSIS — E119 Type 2 diabetes mellitus without complications: Secondary | ICD-10-CM | POA: Diagnosis not present

## 2014-11-08 LAB — COMPREHENSIVE METABOLIC PANEL
ALT: 24 U/L (ref 0–35)
AST: 20 U/L (ref 0–37)
Albumin: 4.5 g/dL (ref 3.5–5.2)
Alkaline Phosphatase: 47 U/L (ref 39–117)
BILIRUBIN TOTAL: 0.5 mg/dL (ref 0.2–1.2)
BUN: 38 mg/dL — AB (ref 6–23)
CO2: 21 meq/L (ref 19–32)
Calcium: 9.4 mg/dL (ref 8.4–10.5)
Chloride: 105 mEq/L (ref 96–112)
Creat: 1.19 mg/dL — ABNORMAL HIGH (ref 0.50–1.10)
Glucose, Bld: 147 mg/dL — ABNORMAL HIGH (ref 70–99)
Potassium: 4.7 mEq/L (ref 3.5–5.3)
Sodium: 138 mEq/L (ref 135–145)
Total Protein: 7 g/dL (ref 6.0–8.3)

## 2014-11-08 LAB — LIPID PANEL
CHOLESTEROL: 275 mg/dL — AB (ref 0–200)
HDL: 39 mg/dL — AB (ref >=46)
LDL CALC: 167 mg/dL — AB (ref 0–99)
TRIGLYCERIDES: 345 mg/dL — AB (ref <150)
Total CHOL/HDL Ratio: 7.1 Ratio
VLDL: 69 mg/dL — ABNORMAL HIGH (ref 0–40)

## 2014-11-08 LAB — GLUCOSE, POCT (MANUAL RESULT ENTRY): POC GLUCOSE: 127 mg/dL — AB (ref 70–99)

## 2014-11-08 LAB — POCT GLYCOSYLATED HEMOGLOBIN (HGB A1C): HEMOGLOBIN A1C: 7.7

## 2014-11-08 MED ORDER — METFORMIN HCL 1000 MG PO TABS: ORAL_TABLET | ORAL | Status: DC

## 2014-11-08 NOTE — Progress Notes (Signed)
Patient ID: Gina Navarro, female    DOB: 07-12-1950, 64 y.o.   MRN: [id number]  PCP: Jenny Reichmann, MD  Subjective:   Chief Complaint  Patient presents with  . Medication Refill    patient not sure which meds she needs refils  . Diabetes    HPI Presents for evaluation of diabetes.   She notes that even with the increase in Invokana dose from 100 mg to 300 mg, glucose remains "uncontrolled." She did not tolerate Victoza due to heart palpitations, dizziness, peripheral neuropathy. She had considerable weight gain with glipizide previously and is unwilling to try that. She tolerates metformin well without adverse effects, but glucose was not controlled on that alone.  Frequency of home glucose monitoring: QAM, fasting, 180's-190's, lowest on Invokana 300 was 160 Sees a dentist infrequently, eye specialist annually (last week). Checks feet daily. Is current with influenza vaccine. Is current with pneumococcal vaccine. Next dose due at age 28.   Review of Systems  Constitutional: Negative for activity change, appetite change, fatigue and unexpected weight change.  HENT: Negative for congestion, dental problem, ear pain, hearing loss, mouth sores, postnasal drip, rhinorrhea, sneezing, sore throat, tinnitus and trouble swallowing.   Eyes: Negative for photophobia, pain, redness and visual disturbance.  Respiratory: Negative for cough, chest tightness and shortness of breath (with exertion (like carrying something heavy up stairs)).   Cardiovascular: Negative for chest pain, palpitations and leg swelling.  Gastrointestinal: Negative for nausea, vomiting, abdominal pain, diarrhea, constipation and blood in stool.  Genitourinary: Positive for urgency and frequency. Negative for dysuria and hematuria.  Musculoskeletal: Negative for myalgias, arthralgias, gait problem and neck stiffness.  Skin: Negative for rash.  Neurological: Negative for dizziness, speech difficulty, weakness,  light-headedness, numbness and headaches.  Hematological: Negative for adenopathy.  Psychiatric/Behavioral: Negative for confusion and sleep disturbance. The patient is not nervous/anxious.        Patient Active Problem List   Diagnosis Date Noted  . BMI 30.0-30.9,adult 08/09/2014  . Carotid stenosis, bilateral 08/31/2013  . Exertional chest pain 08/31/2013  . Diabetes mellitus type 2, uncomplicated 89/38/1017  . HTN (hypertension) 04/28/2012  . Hyperlipidemia LDL goal <70 04/28/2012     Prior to Admission medications   Medication Sig Start Date End Date Taking? Authorizing Provider  B-D ULTRAFINE III SHORT PEN 31G X 8 MM MISC USE AS DIRECTED   Yes Mancel Bale, PA-C  canagliflozin (INVOKANA) 300 MG TABS tablet Take 300 mg by mouth daily before breakfast. 08/29/14  Yes Joaquin Knebel, PA-C  cholecalciferol (VITAMIN D) 1000 UNITS tablet Take 1,000 Units by mouth daily.   Yes Historical Provider, MD  Evening Primrose topical oil Apply topically as needed for dry skin.   Yes Historical Provider, MD  fish oil-omega-3 fatty acids 1000 MG capsule Take 2 g by mouth daily.   Yes Historical Provider, MD  glucosamine-chondroitin 500-400 MG tablet Take 1 tablet by mouth 3 (three) times daily.   Yes Historical Provider, MD  glucose blood (ONE TOUCH ULTRA TEST) test strip USE AS INSTRUCTED.  "NEEDS A FOLLOW UP VISIT" 04/04/14  Yes Marja Adderley, PA-C  lisinopril-hydrochlorothiazide (PRINZIDE,ZESTORETIC) 20-25 MG per tablet TAKE 1 TABLET BY MOUTH DAILY. 08/18/14  Yes Deavin Forst, PA-C  metFORMIN (GLUCOPHAGE) 1000 MG tablet Take 1 tablet (1,000 mg total)  By mouth 2 (two) times daily with a meal 10/26/14  Yes Sarah Alleen Borne, PA-C  OVER THE COUNTER MEDICATION Take 1 tablet by mouth 2 (two) times daily. Co Q  10/L Carnitine 100/1000   Yes Historical Provider, MD  Turmeric Curcumin 500 MG CAPS Take 1 capsule by mouth 2 (two) times daily.   Yes Historical Provider, MD  nitroGLYCERIN (NITROSTAT) 0.4 MG SL  tablet Place 1 tablet (0.4 mg total) under the tongue every 5 (five) minutes as needed for chest pain. Patient not taking: Reported on 11/08/2014 02/16/13   Harrison Mons, PA-C     Allergies  Allergen Reactions  . Bee Venom   . Statins Other (See Comments)    MYALGIAS with Crestor, Zocor. Tolerating Lipitor (05/2013)  . Victoza [Liraglutide]     Dizziness, peripheral neuropathy, constipation, heart palpitations       Objective:  Physical Exam  Constitutional: She is oriented to person, place, and time. She appears well-developed and well-nourished. No distress.  Eyes: Conjunctivae are normal. No scleral icterus.  Neck: No thyromegaly present.  Cardiovascular: Normal rate, regular rhythm, normal heart sounds and intact distal pulses.   Pulmonary/Chest: Effort normal and breath sounds normal.  Lymphadenopathy:    She has no cervical adenopathy.  Neurological: She is alert and oriented to person, place, and time.  Skin: Skin is warm and dry.  Psychiatric: She has a normal mood and affect. Her behavior is normal.  Vitals reviewed.  Diabetic Foot Exam - Simple   Simple Foot Form  Diabetic Foot exam was performed with the following findings:  Yes 11/08/2014  9:59 AM  Visual Inspection  No deformities, no ulcerations, no other skin breakdown bilaterally:  Yes  Sensation Testing  Intact to touch and monofilament testing bilaterally:  Yes  See comments:  Yes  Pulse Check  Comments  Some loss of feeling laterally on left foot.        Results for orders placed or performed in visit on 11/08/14  POCT glucose (manual entry)  Result Value Ref Range   POC Glucose 127 (A) 70 - 99 mg/dl  POCT glycosylated hemoglobin (Hb A1C)  Result Value Ref Range   Hemoglobin A1C 7.7        Assessment & Plan:   1. Diabetes mellitus type 2, uncomplicated Remains reasonably controlled, but she previously had A1C below 7%. She's ready to see an endocrinologist to discuss options. Of note, she is  unwilling to take any medication that will cause weight gain. We discussed Trulicity and insulin. - metFORMIN (GLUCOPHAGE) 1000 MG tablet; Take 1 tablet (1,000 mg total)  By mouth 2 (two) times daily with a meal  Dispense: 180 tablet; Refill: 3 - Ambulatory referral to Endocrinology - POCT glucose (manual entry) - POCT glycosylated hemoglobin (Hb A1C) - Comprehensive metabolic panel  2. Essential hypertension Excellent control. If SBP remains this low, consider reducing ACEI dose.  3. Hyperlipidemia LDL goal <70 Await lab results. Anticipate lipids will be elevated.  If so, will re-encourage her to begin the Crestor (she didn't start it before, not wanting to start two new medications at the same time) - Lipid panel  4. Carotid stenosis, bilateral Needs good control of HTN, diabetes and lipids. See above.   Fara Chute, PA-C Physician Assistant-Certified [facility] .

## 2014-12-13 ENCOUNTER — Encounter: Payer: Self-pay | Admitting: Nurse Practitioner

## 2014-12-13 ENCOUNTER — Ambulatory Visit (INDEPENDENT_AMBULATORY_CARE_PROVIDER_SITE_OTHER): Payer: 59 | Admitting: Nurse Practitioner

## 2014-12-13 VITALS — BP 130/74 | HR 80 | Resp 20 | Ht 63.25 in | Wt 181.6 lb

## 2014-12-13 DIAGNOSIS — R3915 Urgency of urination: Secondary | ICD-10-CM

## 2014-12-13 DIAGNOSIS — Z0000 Encounter for general adult medical examination without abnormal findings: Secondary | ICD-10-CM | POA: Diagnosis not present

## 2014-12-13 DIAGNOSIS — Z01419 Encounter for gynecological examination (general) (routine) without abnormal findings: Secondary | ICD-10-CM

## 2014-12-13 LAB — POCT URINALYSIS DIPSTICK
Bilirubin, UA: NEGATIVE
KETONES UA: NEGATIVE
Nitrite, UA: NEGATIVE
Protein, UA: NEGATIVE
RBC UA: NEGATIVE
UROBILINOGEN UA: NEGATIVE
pH, UA: 5

## 2014-12-13 NOTE — Patient Instructions (Signed)

## 2014-12-13 NOTE — Progress Notes (Signed)
Patient ID: Gina Navarro, female   DOB: 08-30-1950, 64 y.o.   MRN: [id number] 64 y.o. G0P0 Single  Caucasian Fe here for annual exam.  No new health problems other than trouble with getting blood sugar regulated.  She had been on Victoza but this was giving her SE so was changed to [facility] since March.   Last HGB AIC 7.0 on Invokana.  Now to see endocrinologist next week.  Same female partner for 12 years.  Patient's last menstrual period was 05/13/1996.          Sexually active: Yes.  female partner  The current method of family planning is post menopausal status.    Exercising: Yes.    Walks 30 minutes 3-4 times/week. Smoker:  former  Health Maintenance: Pap:  11-28-11 Neg:Neg HR HPV MMG: 05-19-13 Density Cat. B/benign vascular calcifications both breasts;; benign density right breast:BiRads 2/Neg:[facility].   Colonoscopy:  never - IFOB test is given by PCP BMD:  Will fax an order to [facility] TDaP:  11-06-07 Shingles:  02/23/13 Labs: PCP, Urine Dip: 2+WBCs, Mod.glucose   has an unknown smoking status. She has never used smokeless tobacco.  Past Medical History  Diagnosis Date  . Arthritis   . Menopausal state   . Hypertension 2006  . Diabetes mellitus without complication 8101    oral meds until Victoza in 07/2012, now change to [facility]    Past Surgical History  Procedure Laterality Date  . Tonsillectomy and adenoidectomy    . Mohs surgery  2002    chin x 1 and right forehead X 1 in 2004    Current Outpatient Prescriptions  Medication Sig Dispense Refill  . canagliflozin (INVOKANA) 300 MG TABS tablet Take 300 mg by mouth daily before breakfast. 90 tablet 3  . cholecalciferol (VITAMIN D) 1000 UNITS tablet Take 1,000 Units by mouth daily.    . Evening Primrose topical oil Apply topically as needed for dry skin.    . fish oil-omega-3 fatty acids 1000 MG capsule Take 2 g by mouth daily.    Marland Kitchen glucosamine-chondroitin 500-400 MG tablet Take 1 tablet by mouth 3 (three) times daily.    Marland Kitchen  glucose blood (ONE TOUCH ULTRA TEST) test strip USE AS INSTRUCTED.  "NEEDS A FOLLOW UP VISIT" 100 each 0  . lisinopril-hydrochlorothiazide (PRINZIDE,ZESTORETIC) 20-25 MG per tablet TAKE 1 TABLET BY MOUTH DAILY. 30 tablet 5  . metFORMIN (GLUCOPHAGE) 1000 MG tablet Take 1 tablet (1,000 mg total)  By mouth 2 (two) times daily with a meal 180 tablet 3  . nitroGLYCERIN (NITROSTAT) 0.4 MG SL tablet Place 1 tablet (0.4 mg total) under the tongue every 5 (five) minutes as needed for chest pain. (Patient not taking: Reported on 11/08/2014) 25 tablet 1  . OVER THE COUNTER MEDICATION Take 1 tablet by mouth 2 (two) times daily. Co Q 10/L Carnitine 100/1000    . Turmeric Curcumin 500 MG CAPS Take 1 capsule by mouth 2 (two) times daily.     No current facility-administered medications for this visit.    Family History  Problem Relation Age of Onset  . Adopted: Yes  . Liver cancer Mother   . Lung cancer Mother   . Brain cancer Father     ROS:  Pertinent items are noted in HPI.  Otherwise, a comprehensive ROS was negative.  Exam:   BP 130/74 mmHg  Pulse 80  Resp 20  Ht 5' 3.25" (1.607 m)  Wt 181 lb 9.6 oz (82.373 kg)  BMI 31.90  kg/m2  LMP 05/13/1996 Height: 5' 3.25" (160.7 cm) Ht Readings from Last 3 Encounters:  12/13/14 5' 3.25" (1.607 m)  11/08/14 5' 3.5" (1.613 m)  08/09/14 5' 4.5" (1.638 m)    General appearance: alert, cooperative and appears stated age Head: Normocephalic, without obvious abnormality, atraumatic Neck: no adenopathy, supple, symmetrical, trachea midline and thyroid normal to inspection and palpation Lungs: clear to auscultation bilaterally Breasts: normal appearance, no masses or tenderness Heart: regular rate and rhythm Abdomen: soft, non-tender; no masses,  no organomegaly Extremities: extremities normal, atraumatic, no cyanosis or edema Skin: Skin color, texture, turgor normal. No rashes or lesions Lymph nodes: Cervical, supraclavicular, and axillary nodes  normal. No abnormal inguinal nodes palpated Neurologic: Grossly normal   Pelvic: External genitalia:  no lesions              Urethra:  normal appearing urethra with no masses, tenderness or lesions              Bartholin's and Skene's: normal                 Vagina: very atrophic appearing vagina with normal color and discharge, no lesions              Cervix: anteverted bled with pap, no polyp seen              Pap taken: Yes.   Bimanual Exam:  Uterus:  normal size, contour, position, consistency, mobility, non-tender              Adnexa: no mass, fullness, tenderness               Rectovaginal: Confirms               Anus:  normal sphincter tone, no lesions  Chaperone present: Yes  A:  Well Woman with normal exam  History of menopause, DM, HTN [place]: adoptive mother with liver, lung cancer, passed 2013  P:   Reviewed health and wellness pertinent to exam  Pap smear as above  Mammogram is due now and will schedule  Will send order for BMD  If any further bleeding to call back  Counseled on breast self exam, mammography screening, adequate intake of calcium and vitamin D, diet and exercise return annually or prn  An After Visit Summary was printed and given to the patient.

## 2014-12-15 LAB — IPS PAP TEST WITH HPV

## 2014-12-15 LAB — URINE CULTURE
COLONY COUNT: NO GROWTH
ORGANISM ID, BACTERIA: NO GROWTH

## 2014-12-17 NOTE — Progress Notes (Signed)
Encounter reviewed by Dr. Rosemae Mcquown Amundson C. Silva.  

## 2014-12-21 ENCOUNTER — Encounter: Payer: Self-pay | Admitting: Endocrinology

## 2014-12-21 ENCOUNTER — Ambulatory Visit (INDEPENDENT_AMBULATORY_CARE_PROVIDER_SITE_OTHER): Payer: 59 | Admitting: Endocrinology

## 2014-12-21 VITALS — BP 128/74 | HR 90 | Temp 98.1°F | Ht 63.5 in | Wt 185.0 lb

## 2014-12-21 DIAGNOSIS — E119 Type 2 diabetes mellitus without complications: Secondary | ICD-10-CM | POA: Diagnosis not present

## 2014-12-21 MED ORDER — DULAGLUTIDE 0.75 MG/0.5ML ~~LOC~~ SOAJ: 0.7500 mg | SUBCUTANEOUS | Status: DC

## 2014-12-21 NOTE — Progress Notes (Signed)
Subjective:    Patient ID: Gina Navarro, female    DOB: 1950/07/21, 64 y.o.   MRN: [id number]  HPI pt states DM was dx'ed in 2002; he has mild neuropathy of the lower extremities, and associated PAD; she has never been on insulin; pt says her diet is good, but exercise is good; she has never had GDM, pancreatitis, severe hypoglycemia or DKA.  She says victoza helped cbg's, but she could not tolerate (foot numbness and pain, nausea, and chest pain).   Past Medical History  Diagnosis Date  . Arthritis   . Menopausal state   . Hypertension 2006  . Diabetes mellitus without complication 6378    oral meds until Victoza in 07/2012, now change to [facility]    Past Surgical History  Procedure Laterality Date  . Tonsillectomy and adenoidectomy    . Mohs surgery  2002    chin x 1 and right forehead X 1 in 2004    Social History   Social History  . Marital Status: Single    Spouse Name: n/a  . Number of Children: 0  . Years of Education: 17   Occupational History  . Psychologist (PhD)    Social History Main Topics  . Smoking status: Unknown If Ever Smoked  . Smokeless tobacco: Never Used  . Alcohol Use: Not on file  . Drug Use: Not on file  . Sexual Activity:    Partners: Female    Patent examiner Protection: Post-menopausal   Other Topics Concern  . Not on file   Social History Narrative   Adoptive mother died in 2012/04/08. Lives alone.    Current Outpatient Prescriptions on File Prior to Visit  Medication Sig Dispense Refill  . canagliflozin (INVOKANA) 300 MG TABS tablet Take 300 mg by mouth daily before breakfast. 90 tablet 3  . cholecalciferol (VITAMIN D) 1000 UNITS tablet Take 1,000 Units by mouth daily.    . Evening Primrose topical oil Apply topically as needed for dry skin.    . fish oil-omega-3 fatty acids 1000 MG capsule Take 2 g by mouth daily.    Marland Kitchen glucosamine-chondroitin 500-400 MG tablet Take 1 tablet by mouth 3 (three) times daily.    Marland Kitchen glucose blood (ONE  TOUCH ULTRA TEST) test strip USE AS INSTRUCTED.  "NEEDS A FOLLOW UP VISIT" 100 each 0  . lisinopril-hydrochlorothiazide (PRINZIDE,ZESTORETIC) 20-25 MG per tablet TAKE 1 TABLET BY MOUTH DAILY. 30 tablet 5  . metFORMIN (GLUCOPHAGE) 1000 MG tablet Take 1 tablet (1,000 mg total)  By mouth 2 (two) times daily with a meal 180 tablet 3  . nitroGLYCERIN (NITROSTAT) 0.4 MG SL tablet Place 1 tablet (0.4 mg total) under the tongue every 5 (five) minutes as needed for chest pain. 25 tablet 1  . OVER THE COUNTER MEDICATION Take 1 tablet by mouth 2 (two) times daily. Co Q 10/L Carnitine 100/1000    . Turmeric Curcumin 500 MG CAPS Take 1 capsule by mouth 2 (two) times daily.     No current facility-administered medications on file prior to visit.    Allergies  Allergen Reactions  . Bee Venom   . Statins Other (See Comments)    MYALGIAS with Crestor, Zocor. Tolerating Lipitor (05/2013)  . Victoza [Liraglutide]     Dizziness, peripheral neuropathy, constipation, heart palpitations    Family History  Problem Relation Age of Onset  . Adopted: Yes  . Liver cancer Mother   . Lung cancer Mother   . Brain cancer Father  BP 128/74 mmHg  Pulse 90  Temp(Src) 98.1 F (36.7 C) (Oral)  Ht 5' 3.5" (1.613 m)  Wt 185 lb (83.915 kg)  BMI 32.25 kg/m2  SpO2 97%  LMP 05/13/1996   Review of Systems denies weight loss, blurry vision, headache, vomiting, urinary frequency, muscle cramps, excessive diaphoresis, depression, cold intolerance, rhinorrhea, edema, and easy bruising. She has fatigue and doe.     Objective:   Physical Exam VS: see vs page GEN: no distress HEAD: head: no deformity eyes: no periorbital swelling, no proptosis external nose and ears are normal mouth: no lesion seen NECK: supple, thyroid is not enlarged CHEST WALL: no deformity LUNGS:  Clear to auscultation CV: reg rate and rhythm, no murmur ABD: abdomen is soft, nontender.  no hepatosplenomegaly.  not distended.  no  hernia MUSCULOSKELETAL: muscle bulk and strength are grossly normal.  no obvious joint swelling.  gait is normal and steady EXTEMITIES: no deformity.  no ulcer on the feet.  feet are of normal color and temp.  no edema PULSES: dorsalis pedis intact bilat.  right carotid bruit is noted NEURO:  cn 2-12 grossly intact.   readily moves all 4's.  sensation is intact to touch on the feet SKIN:  Normal texture and temperature.  No rash or suspicious lesion is visible.   NODES:  None palpable at the neck PSYCH: alert, well-oriented.  Does not appear anxious nor depressed.  Lab Results  Component Value Date   HGBA1C 7.7 11/08/2014   Lab Results  Component Value Date   CREATININE 1.19* 11/08/2014   BUN 38* 11/08/2014   NA 138 11/08/2014   K 4.7 11/08/2014   CL 105 11/08/2014   CO2 21 11/08/2014  i personally reviewed electrocardiogram tracing (02/18/13): normal    Assessment & Plan:  DM: she needs increased rx, if it can be done with a regimen that avoids or minimizes hypoglycemia. Nausea, and other sxs, new: due to victoza, new to me.    Patient is advised the following: Patient Instructions  good diet and exercise significantly improve the control of your diabetes.  please let me know if you wish to be referred to a dietician.  high blood sugar is very risky to your health.  you should see an eye doctor and dentist every year.  It is very important to get all recommended vaccinations.  controlling your blood pressure and cholesterol drastically reduces the damage diabetes does to your body.  Those who smoke should quit.  please discuss these with your doctor.  check your blood sugar once a day.  vary the time of day when you check, between before the 3 meals, and at bedtime.  also check if you have symptoms of your blood sugar being too high or too low.  please keep a record of the readings and bring it to your next appointment here.  You can write it on any piece of paper.  please call us  sooner if your blood sugar goes below 70, or if you have a lot of readings over 200.  i have sent a prescription to your pharmacy, to add "trulicity."  Please call if insurance does not cover, so we can change to "byetta."  Please come back for a follow-up appointment in 3 months.

## 2014-12-21 NOTE — Patient Instructions (Addendum)
good diet and exercise significantly improve the control of your diabetes.  please let me know if you wish to be referred to a dietician.  high blood sugar is very risky to your health.  you should see an eye doctor and dentist every year.  It is very important to get all recommended vaccinations.  controlling your blood pressure and cholesterol drastically reduces the damage diabetes does to your body.  Those who smoke should quit.  please discuss these with your doctor.  check your blood sugar once a day.  vary the time of day when you check, between before the 3 meals, and at bedtime.  also check if you have symptoms of your blood sugar being too high or too low.  please keep a record of the readings and bring it to your next appointment here.  You can write it on any piece of paper.  please call us sooner if your blood sugar goes below 70, or if you have a lot of readings over 200.  i have sent a prescription to your pharmacy, to add "trulicity."  Please call if insurance does not cover, so we can change to "byetta."  Please come back for a follow-up appointment in 3 months.

## 2014-12-30 ENCOUNTER — Telehealth: Payer: Self-pay | Admitting: Endocrinology

## 2014-12-30 MED ORDER — EXENATIDE 5 MCG/0.02ML ~~LOC~~ SOPN: 5.0000 ug | PEN_INJECTOR | Freq: Two times a day (BID) | SUBCUTANEOUS | Status: DC

## 2014-12-30 NOTE — Telephone Encounter (Signed)
please call patient: Ins declines trulicity. i have sent a prescription to your pharmacy, to change to "byetta." i'll see you next time.

## 2015-01-02 ENCOUNTER — Other Ambulatory Visit: Payer: Self-pay | Admitting: Physician Assistant

## 2015-01-02 NOTE — Telephone Encounter (Signed)
Let voicemail advising of note below. Requested call back if the pt would like to discuss.

## 2015-01-26 ENCOUNTER — Other Ambulatory Visit: Payer: Self-pay | Admitting: Physician Assistant

## 2015-02-14 ENCOUNTER — Encounter: Payer: Self-pay | Admitting: Physician Assistant

## 2015-02-14 ENCOUNTER — Other Ambulatory Visit: Payer: Self-pay

## 2015-02-14 ENCOUNTER — Encounter: Payer: Self-pay | Admitting: Emergency Medicine

## 2015-02-14 ENCOUNTER — Ambulatory Visit (INDEPENDENT_AMBULATORY_CARE_PROVIDER_SITE_OTHER): Payer: 59 | Admitting: Physician Assistant

## 2015-02-14 VITALS — BP 116/70 | HR 93 | Temp 98.5°F | Resp 16 | Ht 63.75 in | Wt 181.4 lb

## 2015-02-14 DIAGNOSIS — Z1159 Encounter for screening for other viral diseases: Secondary | ICD-10-CM | POA: Diagnosis not present

## 2015-02-14 DIAGNOSIS — Z1211 Encounter for screening for malignant neoplasm of colon: Secondary | ICD-10-CM | POA: Diagnosis not present

## 2015-02-14 DIAGNOSIS — I10 Essential (primary) hypertension: Secondary | ICD-10-CM

## 2015-02-14 DIAGNOSIS — Z23 Encounter for immunization: Secondary | ICD-10-CM

## 2015-02-14 DIAGNOSIS — E1140 Type 2 diabetes mellitus with diabetic neuropathy, unspecified: Secondary | ICD-10-CM | POA: Diagnosis not present

## 2015-02-14 DIAGNOSIS — E785 Hyperlipidemia, unspecified: Secondary | ICD-10-CM | POA: Diagnosis not present

## 2015-02-14 DIAGNOSIS — E1143 Type 2 diabetes mellitus with diabetic autonomic (poly)neuropathy: Secondary | ICD-10-CM

## 2015-02-14 LAB — LIPID PANEL
CHOL/HDL RATIO: 7.2 ratio — AB (ref <=5.0)
CHOLESTEROL: 265 mg/dL — AB (ref 125–200)
HDL: 37 mg/dL — ABNORMAL LOW (ref >=46)
LDL CALC: 158 mg/dL — AB (ref <130)
TRIGLYCERIDES: 351 mg/dL — AB (ref <150)
VLDL: 70 mg/dL — AB (ref <30)

## 2015-02-14 LAB — HEPATITIS C ANTIBODY: HCV AB: NEGATIVE

## 2015-02-14 MED ORDER — COLESEVELAM HCL 625 MG PO TABS: 1875.0000 mg | ORAL_TABLET | Freq: Two times a day (BID) | ORAL | Status: DC

## 2015-02-14 NOTE — Progress Notes (Signed)
Subjective:     Patient ID: Gina Navarro, female   DOB: 1951-04-12, 64 y.o.   MRN: [id number] PCP: Jenny Reichmann, MD  Chief Complaint  Patient presents with  . Follow-up  . Diabetes  . Hypertension  . Hyperlipidemia    HPI Patient presents today for evaluation of diabetes, hypertension, and hyperlipidemia.   She sees an endocrinologist at [facility] for her diabetes. She has only had one visit there so far. She takes Invokana and Metformin. The endocrinologist prescribed Byetta in addition to these medications, however patient had "palpitations, peripheral neuropathy, and angina" with this medication so she stopped. She says it is the same thing that happened when she took Victoza. She states that she still has some residual peripheral neuropathy from this reaction.   Blood pressure is well-controlled on lisionopril-hydrochlorothiazide. She checks her blood pressure periodically at home and the readings have been good.   She says that she has not been eating a good diet. She tries to eat low carb but it's hard. She's been trying to eat more fruits and vegetables. She has an exercise machine at home that she has been trying to use every day.   No chest pain or shortness of breath. No headaches or visual disturbances. No nausea, vomiting, diarrhea, constipation.   She is not currently taking any statins because of severe side effects.   Review of Systems  Eyes: Negative for visual disturbance.  Respiratory: Negative for shortness of breath.   Cardiovascular: Negative for chest pain.  Gastrointestinal: Negative for nausea, vomiting, abdominal pain, diarrhea and constipation.  Genitourinary: Negative for dysuria, urgency and frequency.  Skin: Negative for rash.  Neurological: Negative for dizziness, syncope, light-headedness and headaches.  See HPI   Patient Active Problem List   Diagnosis Date Noted  . BMI 30.0-30.9,adult 08/09/2014  . Carotid stenosis, bilateral 08/31/2013  .  Exertional chest pain 08/31/2013  . Diabetes mellitus type 2, uncomplicated ([place]) 29/51/8841  . HTN (hypertension) 04/28/2012  . Hyperlipidemia LDL goal <70 04/28/2012    Prior to Admission medications   Medication Sig Start Date End Date Taking? Authorizing Provider  canagliflozin (INVOKANA) 300 MG TABS tablet Take 300 mg by mouth daily before breakfast. 08/29/14  Yes Chelle Jeffery, PA-C  cholecalciferol (VITAMIN D) 1000 UNITS tablet Take 1,000 Units by mouth daily.   Yes Historical Provider, MD  Evening Primrose topical oil Apply topically as needed for dry skin.   Yes Historical Provider, MD  exenatide (BYETTA 5 MCG PEN) 5 MCG/0.02ML SOPN injection Inject 0.02 mLs (5 mcg total) into the skin 2 (two) times daily with a meal. And pen needles 2/day 12/30/14  Yes Renato Shin, MD  fish oil-omega-3 fatty acids 1000 MG capsule Take 2 g by mouth daily.   Yes Historical Provider, MD  glucosamine-chondroitin 500-400 MG tablet Take 1 tablet by mouth 3 (three) times daily.   Yes Historical Provider, MD  glucose blood (ONE TOUCH ULTRA TEST) test strip USE AS INSTRUCTED 01/03/15  Yes Chelle Jeffery, PA-C  lisinopril-hydrochlorothiazide (PRINZIDE,ZESTORETIC) 20-25 MG per tablet TAKE 1 TABLET BY MOUTH DAILY. 01/27/15  Yes Chelle Jeffery, PA-C  metFORMIN (GLUCOPHAGE) 1000 MG tablet Take 1 tablet (1,000 mg total)  By mouth 2 (two) times daily with a meal 11/08/14  Yes Chelle Jeffery, PA-C  nitroGLYCERIN (NITROSTAT) 0.4 MG SL tablet Place 1 tablet (0.4 mg total) under the tongue every 5 (five) minutes as needed for chest pain. 02/16/13  Yes Chelle Jeffery, PA-C  OVER THE COUNTER MEDICATION Take  1 tablet by mouth 2 (two) times daily. Co Q 10/L Carnitine 100/1000   Yes Historical Provider, MD  Turmeric Curcumin 500 MG CAPS Take 1 capsule by mouth 2 (two) times daily.   Yes Historical Provider, MD    Allergies  Allergen Reactions  . Bee Venom   . Statins Other (See Comments)    MYALGIAS with Crestor, Zocor.  Tolerating Lipitor (05/2013)  . Victoza [Liraglutide]     Dizziness, peripheral neuropathy, constipation, heart palpitations    Objective:  Physical Exam  Constitutional: She is oriented to person, place, and time. She appears well-developed and well-nourished.  HENT:  Head: Normocephalic and atraumatic.  Cardiovascular: Normal rate and regular rhythm.   Pulmonary/Chest: Effort normal and breath sounds normal.  Neurological: She is alert and oriented to person, place, and time.  Skin: Skin is warm and dry.  Psychiatric: She has a normal mood and affect. Her behavior is normal.    BP 116/70 mmHg  Pulse 93  Temp(Src) 98.5 F (36.9 C) (Oral)  Resp 16  Ht 5' 3.75" (1.619 m)  Wt 181 lb 6.4 oz (82.283 kg)  BMI 31.39 kg/m2  SpO2 98%  LMP 05/13/1996    Assessment & Plan:  1. Type 2 diabetes mellitus with diabetic neuropathy, without long-term current use of insulin ([place]) Await lab results. Follow-up with Endocrinology as scheduled. Welchol for lipid and diabetes control.  - POCT glucose (manual entry) - POCT glycosylated hemoglobin (Hb A1C) - colesevelam (WELCHOL) 625 MG tablet; Take 3 tablets (1,875 mg total) by mouth 2 (two) times daily with a meal.  Dispense: 540 tablet; Refill: 3  2. Type 2 diabetes mellitus with diabetic autonomic neuropathy, without long-term current use of insulin ([place])  3. Essential hypertension Well-controlled. Continue current treatment.   4. Hyperlipidemia LDL goal <70 Await lab results. Due to adverse effects with a few different statins start Welchol.  - Lipid panel  5. Need for hepatitis C screening test - Hepatitis C antibody  6. Need for influenza vaccination - Flu Vaccine QUAD 36+ mos IM  7. Screening for colon cancer - CT Virtual Colonoscopy Screening; Future   Follow-up in 6 months (around 08/15/2015) or earlier as needed.   Keyonta Madrid D. Race, PA-S Physician Assistant Student [facility]

## 2015-02-14 NOTE — Progress Notes (Signed)
Patient ID: Gina Navarro, female    DOB: [birth year], 64 y.o.   MRN: [id number]  PCP: Wynne Dust  Subjective:   Chief Complaint  Patient presents with  . Follow-up  . Diabetes  . Hypertension  . Hyperlipidemia    HPI Presents for evaluation of diabetes, hypertension, and hyperlipidemia.   She sees an endocrinologist (Dr. Loanne Drilling) for her diabetes. She has only had one visit there so far. She takes Invokana and Metformin. Her insurance doesn't cover Trulicity. The endocrinologist prescribed Byetta in addition to these medications, however patient had "palpitations, peripheral neuropathy, and angina" with this medication so she stopped. She says it is the same thing that happened when she took Victoza. She states that she still has some residual peripheral neuropathy from this reaction.   Blood pressure is well-controlled on lisionopril-hydrochlorothiazide. She checks her blood pressure periodically at home and the readings have been good.   She says that she has not been eating a good diet. She tries to eat low carb but it's hard. She's been trying to eat more fruits and vegetables. She has an exercise machine at home that she has been trying to use every day.   No chest pain or shortness of breath. No headaches or visual disturbances. No nausea, vomiting, diarrhea, constipation.   She is not currently taking any statins because of severe side effects.    Review of Systems Eyes: Negative for visual disturbance.  Respiratory: Negative for shortness of breath.  Cardiovascular: Negative for chest pain.  Gastrointestinal: Negative for nausea, vomiting, abdominal pain, diarrhea and constipation.  Genitourinary: Negative for dysuria, urgency and frequency.  Skin: Negative for rash.  Neurological: Negative for dizziness, syncope, light-headedness and headaches.  See HPI     Patient Active Problem List   Diagnosis Date Noted  . BMI 30.0-30.9,adult 08/09/2014  . Carotid  stenosis, bilateral 08/31/2013  . Exertional chest pain 08/31/2013  . Diabetes mellitus type 2, uncomplicated ([place]) 87/86/7672  . HTN (hypertension) 04/28/2012  . Hyperlipidemia LDL goal <70 04/28/2012     Prior to Admission medications   Medication Sig Start Date End Date Taking? Authorizing Provider  canagliflozin (INVOKANA) 300 MG TABS tablet Take 300 mg by mouth daily before breakfast. 08/29/14  Yes Genoveva Singleton, PA-C  cholecalciferol (VITAMIN D) 1000 UNITS tablet Take 1,000 Units by mouth daily.   Yes Historical Provider, MD  Evening Primrose topical oil Apply topically as needed for dry skin.   Yes Historical Provider, MD  exenatide (BYETTA 5 MCG PEN) 5 MCG/0.02ML SOPN injection Inject 0.02 mLs (5 mcg total) into the skin 2 (two) times daily with a meal. And pen needles 2/day 12/30/14  Yes Renato Shin, MD  fish oil-omega-3 fatty acids 1000 MG capsule Take 2 g by mouth daily.   Yes Historical Provider, MD  glucosamine-chondroitin 500-400 MG tablet Take 1 tablet by mouth 3 (three) times daily.   Yes Historical Provider, MD  glucose blood (ONE TOUCH ULTRA TEST) test strip USE AS INSTRUCTED 01/03/15  Yes Braxden Lovering, PA-C  lisinopril-hydrochlorothiazide (PRINZIDE,ZESTORETIC) 20-25 MG per tablet TAKE 1 TABLET BY MOUTH DAILY. 01/27/15  Yes Shekinah Pitones, PA-C  metFORMIN (GLUCOPHAGE) 1000 MG tablet Take 1 tablet (1,000 mg total)  By mouth 2 (two) times daily with a meal 11/08/14  Yes Shatori Bertucci, PA-C  nitroGLYCERIN (NITROSTAT) 0.4 MG SL tablet Place 1 tablet (0.4 mg total) under the tongue every 5 (five) minutes as needed for chest pain. 02/16/13  Yes Harrison Mons, PA-C  OVER  THE COUNTER MEDICATION Take 1 tablet by mouth 2 (two) times daily. Co Q 10/L Carnitine 100/1000   Yes Historical Provider, MD  Turmeric Curcumin 500 MG CAPS Take 1 capsule by mouth 2 (two) times daily.   Yes Historical Provider, MD     Allergies  Allergen Reactions  . Bee Venom   . Statins Other (See Comments)      MYALGIAS with Crestor, Zocor. Tolerating Lipitor (05/2013)  . Victoza [Liraglutide]     Dizziness, peripheral neuropathy, constipation, heart palpitations       Objective:  Physical Exam  Constitutional: She is oriented to person, place, and time. She appears well-developed and well-nourished. No distress.  BP 116/70 mmHg  Pulse 93  Temp(Src) 98.5 F (36.9 C) (Oral)  Resp 16  Ht 5' 3.75" (1.619 m)  Wt 181 lb 6.4 oz (82.283 kg)  BMI 31.39 kg/m2  SpO2 98%  LMP 05/13/1996   Eyes: Conjunctivae are normal. No scleral icterus.  Neck: No thyromegaly present.  Cardiovascular: Normal rate, regular rhythm, normal heart sounds and intact distal pulses.   Pulmonary/Chest: Effort normal and breath sounds normal.  Lymphadenopathy:    She has no cervical adenopathy.  Neurological: She is alert and oriented to person, place, and time.  Skin: Skin is warm and dry.  Psychiatric: She has a normal mood and affect. Her behavior is normal.           Assessment & Plan:   1. Type 2 diabetes mellitus with diabetic neuropathy, without long-term current use of insulin ([place]) Await lab results. - POCT glucose (manual entry) - POCT glycosylated hemoglobin (Hb A1C) - colesevelam (WELCHOL) 625 MG tablet; Take 3 tablets (1,875 mg total) by mouth 2 (two) times daily with a meal.  Dispense: 540 tablet; Refill: 3  3. Essential hypertension Controlled.  4. Hyperlipidemia LDL goal <70 - Lipid panel  5. Need for hepatitis C screening test - Hepatitis C antibody  6. Need for influenza vaccination - Flu Vaccine QUAD 36+ mos IM  7. Screening for colon cancer She doesn't have anyone who can drive her home after a procedure with sedation. She would like to have a virtual colonoscopy. - CT Virtual Colonoscopy Screening; Future   Fara Chute, PA-C Physician Assistant-Certified [facility]

## 2015-02-14 NOTE — Patient Instructions (Signed)
I will contact you with your lab results as soon as they are available.   If you have not heard from me in 2 weeks, please contact me.  The fastest way to get your results is to register for My Chart (see the instructions on the last page of this printout).  If your insurance doesn't cover the [facility], check online for a coupon, and/or let me know. We'll switch to Niaspan or fenofibrate.

## 2015-02-23 ENCOUNTER — Other Ambulatory Visit: Payer: Self-pay | Admitting: Physician Assistant

## 2015-02-28 ENCOUNTER — Other Ambulatory Visit: Payer: 59

## 2015-03-23 ENCOUNTER — Ambulatory Visit: Payer: 59 | Admitting: Endocrinology

## 2015-06-21 ENCOUNTER — Telehealth: Payer: Self-pay

## 2015-06-21 NOTE — Telephone Encounter (Signed)
PA completed for Welchol on covermymeds. Pending. Pt has tried/failed zocor, lipitor and crestor d/t intolerable side effects.

## 2015-07-15 ENCOUNTER — Other Ambulatory Visit: Payer: Self-pay | Admitting: Physician Assistant

## 2015-07-18 MED ORDER — CHOLESTYRAMINE 4 G PO PACK: 4.0000 g | PACK | Freq: Three times a day (TID) | ORAL | Status: DC

## 2015-07-18 NOTE — Telephone Encounter (Signed)
Notified pt on Vm about new Rx being sent in.

## 2015-07-18 NOTE — Telephone Encounter (Signed)
I called to check status of PA because I never got a response to PA. I was advised that PA was denied because pt has to have failed BOTH of the preferred formulary alternatives which are Colestipol, and Cholestyramine. Chelle, do you want to put pt on either of these?

## 2015-07-18 NOTE — Telephone Encounter (Signed)
Meds ordered this encounter  Medications  . cholestyramine (QUESTRAN) 4 g packet    Sig: Take 1 packet (4 g total) by mouth 3 (three) times daily with meals.    Dispense:  270 each    Refill:  3    Order Specific Question:  Supervising Provider    Answer:  DOOLITTLE, ROBERT P [[id number]]

## 2015-08-15 ENCOUNTER — Ambulatory Visit (INDEPENDENT_AMBULATORY_CARE_PROVIDER_SITE_OTHER): Payer: BLUE CROSS/BLUE SHIELD | Admitting: Physician Assistant

## 2015-08-15 ENCOUNTER — Encounter: Payer: Self-pay | Admitting: Physician Assistant

## 2015-08-15 VITALS — BP 126/70 | HR 93 | Temp 98.5°F | Resp 16 | Ht 63.75 in | Wt 181.6 lb

## 2015-08-15 DIAGNOSIS — E785 Hyperlipidemia, unspecified: Secondary | ICD-10-CM | POA: Diagnosis not present

## 2015-08-15 DIAGNOSIS — E1140 Type 2 diabetes mellitus with diabetic neuropathy, unspecified: Secondary | ICD-10-CM | POA: Diagnosis not present

## 2015-08-15 DIAGNOSIS — I10 Essential (primary) hypertension: Secondary | ICD-10-CM

## 2015-08-15 LAB — HEMOGLOBIN A1C
HEMOGLOBIN A1C: 7.4 % — AB (ref <5.7)
Mean Plasma Glucose: 166 mg/dL

## 2015-08-15 LAB — COMPREHENSIVE METABOLIC PANEL
ALBUMIN: 4.6 g/dL (ref 3.6–5.1)
ALK PHOS: 55 U/L (ref 33–130)
ALT: 31 U/L — ABNORMAL HIGH (ref 6–29)
AST: 22 U/L (ref 10–35)
BILIRUBIN TOTAL: 0.5 mg/dL (ref 0.2–1.2)
BUN: 33 mg/dL — ABNORMAL HIGH (ref 7–25)
CALCIUM: 9.6 mg/dL (ref 8.6–10.4)
CO2: 15 mmol/L — AB (ref 20–31)
Chloride: 106 mmol/L (ref 98–110)
Creat: 1.34 mg/dL — ABNORMAL HIGH (ref 0.50–0.99)
GLUCOSE: 131 mg/dL — AB (ref 65–99)
POTASSIUM: 4.3 mmol/L (ref 3.5–5.3)
Sodium: 135 mmol/L (ref 135–146)
Total Protein: 7.5 g/dL (ref 6.1–8.1)

## 2015-08-15 LAB — LIPID PANEL
CHOL/HDL RATIO: 6.6 ratio — AB (ref <=5.0)
Cholesterol: 230 mg/dL — ABNORMAL HIGH (ref 125–200)
HDL: 35 mg/dL — ABNORMAL LOW (ref >=46)
TRIGLYCERIDES: 417 mg/dL — AB (ref <150)

## 2015-08-15 LAB — MICROALBUMIN, URINE: MICROALB UR: 0.3 mg/dL

## 2015-08-15 MED ORDER — LISINOPRIL-HYDROCHLOROTHIAZIDE 20-25 MG PO TABS: ORAL_TABLET | ORAL | Status: DC

## 2015-08-15 MED ORDER — CANAGLIFLOZIN 300 MG PO TABS: 300.0000 mg | ORAL_TABLET | Freq: Every day | ORAL | Status: AC

## 2015-08-15 MED ORDER — METFORMIN HCL 1000 MG PO TABS: ORAL_TABLET | ORAL | Status: AC

## 2015-08-15 NOTE — Progress Notes (Signed)
   Subjective:    Patient ID: Gina Navarro, female    DOB: 12-12-1950, 65 y.o.   MRN: [id number]  Chief Complaint  Patient presents with  . Follow-up    6 mos  . Hypertension  . Hyperlipidemia  . Diabetes  . Medication Refill   Patient presents today for 6 month diabetic follow up. HPI      Review of Systems     Objective:   Physical Exam        Assessment & Plan:

## 2015-08-15 NOTE — Progress Notes (Signed)
   Subjective:    Patient ID: Gina Navarro, female    DOB: Jul 25, 1950, 65 y.o.   MRN: [id number]  Chief Complaint  Patient presents with  . Follow-up    6 mos  . Hypertension  . Hyperlipidemia  . Diabetes  . Medication Refill   Patient presents for 6 month follow up for diabetes, hypertension, and hyperlipidemia.  HPI  Patient is doing well today. She is unable to take the Trulicity because her insurance does not cover this medication. She has tried taking Victoza and Byetta in the past and has has severe reactions to both medications. She has been taking her BG at home and the best it has been was 170 and it frequently runs in the low 200's.  She recently started taking Welchol approx 2 weeks ago. And continues to try dieting and increase her exercise. She had an appointment scheduled for a colonoscopy but had to cancel and hasn't rescheduled.  She also is overdue for her mammogram and will call to schedule an appointment.    Review of Systems  Constitutional: Negative for fever, chills, activity change and appetite change.  Eyes: Negative for pain and visual disturbance.  Respiratory: Negative for cough and shortness of breath.   Cardiovascular: Negative for chest pain and leg swelling.  Gastrointestinal: Negative for nausea, vomiting, abdominal pain and diarrhea.  Endocrine: Negative for polydipsia and polyuria.  Genitourinary: Negative for dysuria, urgency and frequency.  Musculoskeletal: Negative for myalgias.  Neurological: Negative for dizziness, light-headedness and headaches.       Objective:   Physical Exam  Constitutional: She is oriented to person, place, and time. She appears well-developed and well-nourished. No distress.  Eyes: Pupils are equal, round, and reactive to light.  Fundoscopic exam:      The right eye shows red reflex.       The left eye shows red reflex.  Neck: Normal range of motion. Neck supple. No thyromegaly present.  Cardiovascular: Normal  rate, regular rhythm and normal heart sounds.   Pulmonary/Chest: Effort normal and breath sounds normal.  Abdominal: Soft. Bowel sounds are normal. She exhibits no distension. There is no tenderness.  Lymphadenopathy:    She has no cervical adenopathy.  Neurological: She is alert and oriented to person, place, and time.  Skin: Skin is warm and dry.          Assessment & Plan:  1. Essential Hypertension Continues to be well controled on current medication regimen. No change at this time.  2. DM type 2 We are waiting for Hgb A1c. Discussed with patient that if we can not get her blood sugar under control might need to start insulin. Continued to counsel patient on need for diet and exercise.  3. Hyperlipidemia Total cholesterol, LDL and triglycerides elevated. Patient may need to restart statin therapy. Has tolerated Crestor ok in past will discuss with patient.  4. Need for colon cancer screening Patient to reschedule procedure  5. Need for mammogram Patient to schedule mammogram appointment  Melina Schools PA-S 08/15/2015

## 2015-08-15 NOTE — Patient Instructions (Addendum)
Please call and schedule your mammogram and endoscopy.     IF you received an x-ray today, you will receive an invoice from [facility]. Please contact [facility] at [phone] with questions or concerns regarding your invoice.   IF you received labwork today, you will receive an invoice from [employer]. Please contact [facility] at [phone] with questions or concerns regarding your invoice.   Our billing staff will not be able to assist you with questions regarding bills from these companies.  You will be contacted with the lab results as soon as they are available. The fastest way to get your results is to activate your My Chart account. Instructions are located on the last page of this paperwork. If you have not heard from [place] regarding the results in 2 weeks, please contact this office.

## 2015-08-15 NOTE — Progress Notes (Signed)
Patient ID: Gina Navarro, female    DOB: 04-15-1951, 65 y.o.   MRN: [id number]  PCP: Wynne Dust  Subjective:   Chief Complaint  Patient presents with  . Follow-up    6 mos  . Hypertension  . Hyperlipidemia  . Diabetes  . Medication Refill    HPI Presents for evaluation of diabetes, hypertension, and hyperlipidemia.  Patient is doing well today.   She is unable to take the Trulicity because her insurance does not cover this medication. She has tried taking Victoza and Byetta in the past and has has severe reactions to both medications. She has been taking her BG at home and the best it has been was 170 and it frequently runs in the low 200's.  She recently started taking Welchol approx 2 weeks ago. And continues to try dieting and increase her exercise.  She had an appointment scheduled for a colonoscopy but had to cancel and hasn't rescheduled.   She also is overdue for her mammogram and will call to schedule an appointment.     Review of Systems Constitutional: Negative for fever, chills, activity change and appetite change.  Eyes: Negative for pain and visual disturbance.  Respiratory: Negative for cough and shortness of breath.  Cardiovascular: Negative for chest pain and leg swelling.  Gastrointestinal: Negative for nausea, vomiting, abdominal pain and diarrhea.  Endocrine: Negative for polydipsia and polyuria.  Genitourinary: Negative for dysuria, urgency and frequency.  Musculoskeletal: Negative for myalgias.  Neurological: Negative for dizziness, light-headedness and headaches.     Patient Active Problem List   Diagnosis Date Noted  . BMI 30.0-30.9,adult 08/09/2014  . Carotid stenosis, bilateral 08/31/2013  . Exertional chest pain 08/31/2013  . Type 2 diabetes mellitus with peripheral neuropathy ([place]) 04/28/2012  . HTN (hypertension) 04/28/2012  . Hyperlipidemia LDL goal <70 04/28/2012     Prior to Admission medications   Medication Sig  Start Date End Date Taking? Authorizing Provider  aspirin 81 MG tablet Take 81 mg by mouth daily.   Yes Historical Provider, MD  canagliflozin (INVOKANA) 300 MG TABS tablet Take 1 tablet (300 mg total) by mouth daily before breakfast. 08/15/15  Yes Jaxyn Rout, PA-C  cholecalciferol (VITAMIN D) 1000 UNITS tablet Take 1,000 Units by mouth daily.   Yes Historical Provider, MD  cholestyramine (QUESTRAN) 4 g packet Take 1 packet (4 g total) by mouth 3 (three) times daily with meals. 07/18/15  Yes Harrison Mons, PA-C  Evening Primrose topical oil Apply topically as needed for dry skin.   Yes Historical Provider, MD  fish oil-omega-3 fatty acids 1000 MG capsule Take 2 g by mouth daily.   Yes Historical Provider, MD  glucosamine-chondroitin 500-400 MG tablet Take 1 tablet by mouth 3 (three) times daily.   Yes Historical Provider, MD  glucose blood (ONE TOUCH ULTRA TEST) test strip USE AS INSTRUCTED 01/03/15  Yes Izaac Reisig, PA-C  lisinopril-hydrochlorothiazide (PRINZIDE,ZESTORETIC) 20-25 MG tablet TAKE 1 TABLET BY MOUTH DAILY 08/15/15  Yes Gerre Ranum, PA-C  metFORMIN (GLUCOPHAGE) 1000 MG tablet Take 1 tablet (1,000 mg total)  By mouth 2 (two) times daily with a meal 08/15/15  Yes Norell Brisbin, PA-C  OVER THE COUNTER MEDICATION Take 1 tablet by mouth 2 (two) times daily. Co Q 10/L Carnitine 100/1000   Yes Historical Provider, MD  Turmeric Curcumin 500 MG CAPS Take 1 capsule by mouth 2 (two) times daily.   Yes Historical Provider, MD  nitroGLYCERIN (NITROSTAT) 0.4 MG SL tablet Place 1 tablet (0.4  mg total) under the tongue every 5 (five) minutes as needed for chest pain. Patient not taking: Reported on 08/15/2015 02/16/13   Harrison Mons, PA-C     Allergies  Allergen Reactions  . Bee Venom   . Statins Other (See Comments)    MYALGIAS with Crestor, Zocor. Tolerating Lipitor (05/2013)  . Victoza [Liraglutide]     Dizziness, peripheral neuropathy, constipation, heart palpitations       Objective:    Physical Exam  Constitutional: She is oriented to person, place, and time. She appears well-developed and well-nourished. No distress.  BP 126/70 mmHg  Pulse 93  Temp(Src) 98.5 F (36.9 C) (Oral)  Resp 16  Ht 5' 3.75" (1.619 m)  Wt 181 lb 9.6 oz (82.373 kg)  BMI 31.43 kg/m2  SpO2 98%  LMP 05/13/1996   Eyes: Conjunctivae are normal. No scleral icterus.  Neck: No thyromegaly present.  Cardiovascular: Normal rate, regular rhythm, normal heart sounds and intact distal pulses.   Pulmonary/Chest: Effort normal and breath sounds normal.  Lymphadenopathy:    She has no cervical adenopathy.  Neurological: She is alert and oriented to person, place, and time.  Skin: Skin is warm and dry.  Psychiatric: She has a normal mood and affect. Her speech is normal and behavior is normal.           Assessment & Plan:   1. Type 2 diabetes mellitus with diabetic neuropathy, without long-term current use of insulin ([place]) Await lab results and adjust treatment as indicated. Consider insulin. - HM Diabetes Foot Exam - metFORMIN (GLUCOPHAGE) 1000 MG tablet; Take 1 tablet (1,000 mg total)  By mouth 2 (two) times daily with a meal  Dispense: 180 tablet; Refill: 3 - canagliflozin (INVOKANA) 300 MG TABS tablet; Take 1 tablet (300 mg total) by mouth daily before breakfast.  Dispense: 90 tablet; Refill: 3 - Comprehensive metabolic panel - Hemoglobin A1c - Microalbumin, urine  2. Essential hypertension Controlled. COntinue current treatment. - lisinopril-hydrochlorothiazide (PRINZIDE,ZESTORETIC) 20-25 MG tablet; TAKE 1 TABLET BY MOUTH DAILY  Dispense: 90 tablet; Refill: 3 - Comprehensive metabolic panel  3. Hyperlipidemia LDL goal <70 Await lab results. Has not tolerated statins. Garment/textile technologist. Continues OTC Fish Oil and last week started cholestyramine. Recheck in 3-6 months. - Comprehensive metabolic panel - Lipid panel   Return in about 3-6 months (around  11/14/2015).   Fara Chute, PA-C Physician Assistant-Certified [facility]

## 2015-10-24 ENCOUNTER — Telehealth: Payer: Self-pay | Admitting: *Deleted

## 2015-10-24 ENCOUNTER — Ambulatory Visit (INDEPENDENT_AMBULATORY_CARE_PROVIDER_SITE_OTHER): Payer: BLUE CROSS/BLUE SHIELD | Admitting: Physician Assistant

## 2015-10-24 ENCOUNTER — Encounter: Payer: Self-pay | Admitting: Physician Assistant

## 2015-10-24 VITALS — BP 98/70 | HR 94 | Temp 98.1°F | Resp 16 | Ht 64.0 in | Wt 182.1 lb

## 2015-10-24 DIAGNOSIS — IMO0001 Reserved for inherently not codable concepts without codable children: Secondary | ICD-10-CM

## 2015-10-24 DIAGNOSIS — R2231 Localized swelling, mass and lump, right upper limb: Secondary | ICD-10-CM

## 2015-10-24 DIAGNOSIS — R0989 Other specified symptoms and signs involving the circulatory and respiratory systems: Secondary | ICD-10-CM | POA: Diagnosis not present

## 2015-10-24 NOTE — Progress Notes (Signed)
Patient ID: Gina Navarro, female    DOB: 1950-10-19, 65 y.o.   MRN: [id number]  PCP: Wynne Dust  Subjective:   Chief Complaint  Patient presents with  . Mass    under RIGHT arm, per patient noticed it on Saturday    HPI Presents for evaluation of a lump under the RIGHT arm.  First noticed this on 10/21/2015. Non-tender. Not painful. No changes since first noticed it. No fever, chills. 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 No weakness of the arm or hand. No paresthesias of the arm/hand.  No chest pain, SOB, HA, dizziness. No neck/jaw pain. No fever, chills.  Last mammogram was 2015. No history of similar lesion. No history of abnormal mammogram.    Review of Systems As above.    Patient Active Problem List   Diagnosis Date Noted  . BMI 30.0-30.9,adult 08/09/2014  . Carotid stenosis, bilateral 08/31/2013  . Exertional chest pain 08/31/2013  . Type 2 diabetes mellitus with peripheral neuropathy ([place]) 04/28/2012  . HTN (hypertension) 04/28/2012  . Hyperlipidemia LDL goal <70 04/28/2012     Prior to Admission medications   Medication Sig Start Date End Date Taking? Authorizing Provider  aspirin 81 MG tablet Take 81 mg by mouth daily.   Yes Historical Provider, MD  canagliflozin (INVOKANA) 300 MG TABS tablet Take 1 tablet (300 mg total) by mouth daily before breakfast. 08/15/15  Yes Brexlee Heberlein, PA-C  cholecalciferol (VITAMIN D) 1000 UNITS tablet Take 1,000 Units by mouth daily.   Yes Historical Provider, MD  cholestyramine (QUESTRAN) 4 g packet Take 1 packet (4 g total) by mouth 3 (three) times daily with meals. 07/18/15  Yes Harrison Mons, PA-C  Evening Primrose topical oil Apply topically as needed for dry skin.   Yes Historical Provider, MD  fish oil-omega-3 fatty acids 1000 MG capsule Take 2 g by mouth daily.   Yes Historical Provider, MD  glucosamine-chondroitin 500-400 MG tablet Take 1 tablet by  mouth 3 (three) times daily.   Yes Historical Provider, MD  lisinopril-hydrochlorothiazide (PRINZIDE,ZESTORETIC) 20-25 MG tablet TAKE 1 TABLET BY MOUTH DAILY 08/15/15  Yes Rayelynn Loyal, PA-C  metFORMIN (GLUCOPHAGE) 1000 MG tablet Take 1 tablet (1,000 mg total)  By mouth 2 (two) times daily with a meal 08/15/15  Yes Michille Mcelrath, PA-C  nitroGLYCERIN (NITROSTAT) 0.4 MG SL tablet Place 1 tablet (0.4 mg total) under the tongue every 5 (five) minutes as needed for chest pain. 02/16/13  Yes Dailen Mcclish, PA-C  Turmeric Curcumin 500 MG CAPS Take 1 capsule by mouth 2 (two) times daily.   Yes Historical Provider, MD  glucose blood (ONE TOUCH ULTRA TEST) test strip USE AS INSTRUCTED 01/03/15   Nunzio Banet, PA-C  OVER THE COUNTER MEDICATION Take 1 tablet by mouth 2 (two) times daily. Reported on 10/24/2015    Historical Provider, MD     Allergies  Allergen Reactions  . Bee Venom   . Statins Other (See Comments)    MYALGIAS with Crestor, Zocor. Tolerating Lipitor (05/2013)  . Victoza [Liraglutide]     Dizziness, peripheral neuropathy, constipation, heart palpitations       Objective:  Physical Exam  Constitutional: She is oriented to person, place, and time. She appears well-developed and well-nourished. She is active and cooperative. No distress.  BMI 31.24 kg/m2  SpO2 97%  LMP 05/13/1996   Eyes: Conjunctivae are normal.  Pulmonary/Chest: Effort normal. She exhibits mass (RIGHT anterior axilla, 5  cm firm mass, minimally mobile.). Right breast exhibits no inverted nipple, no nipple discharge, no skin change and no tenderness. Left breast exhibits no inverted nipple, no mass, no nipple discharge, no skin change and no tenderness. Breasts are symmetrical.    Lymphadenopathy:       Head (right side): No submandibular and no tonsillar adenopathy present.       Head (left side): No submandibular and no tonsillar adenopathy present.    She has no cervical adenopathy.    She has no axillary  adenopathy.       Right: No supraclavicular and no epitrochlear adenopathy present.       Left: No supraclavicular and no epitrochlear adenopathy present.  Neurological: She is alert and oriented to person, place, and time.  Psychiatric: She has a normal mood and affect. Her speech is normal and behavior is normal.   Filed Vitals:   10/24/15 1103 10/24/15 1139 10/24/15 1140  BP: 88/60 (RIGHT ARM) 135/77 (LEFT ARM) 98/70 (RIGHT ARM)  Pulse: 94    Temp: 98.1 F (36.7 C)    TempSrc: Oral    Resp: 16    Height: 5\' 4"  (1.626 m)    Weight: 182 lb 1.6 oz (82.6 kg)    SpO2: 97%             Assessment & Plan:   1. Axillary mass, right Ultrasound today. Hopefully biopsy will also be performed. May also need diagnostic mammogram. - US BREAST LTD UNI RIGHT INC AXILLA; Future  2. Differential blood pressure between extremities Doubt mass effect of lesion in the RIGHT axilla, given no other circulatory signs or symptoms. Once mass is examined, will arrange follow-up to include CXR.   Fara Chute, PA-C Physician Assistant-Certified [facility]

## 2015-10-24 NOTE — Telephone Encounter (Signed)
Faxed orders to [facility] for Diagnostic U/S and Diagnostic U/S mammogram today at 3 pm. Patient advised in the office and was given the original order for her copy.

## 2015-10-24 NOTE — Progress Notes (Signed)
   Subjective:    Patient ID: Gina Navarro, female    DOB: Dec 04, 1950, 65 y.o.   MRN: [id number]  HPI  On sat in hte shwoer noticed a lump on the right side under her arm, its huge & rubbery, fixed, No pain,  Has not tried any remedies  Fever, no illness (cold a few weeks ago), no bug bites,  Around sick people  Mammogram - 2015 Last breast exam - Aug  No history of breast cancer  Review of Systems     Objective:   Physical Exam        Assessment & Plan:

## 2015-10-24 NOTE — Patient Instructions (Addendum)
     IF you received an x-ray today, you will receive an invoice from [facility]. Please contact [facility] at [phone] with questions or concerns regarding your invoice.   IF you received labwork today, you will receive an invoice from [facility]. Please contact [facility] at [phone] with questions or concerns regarding your invoice.   Our billing staff will not be able to assist you with questions regarding bills from these companies.  You will be contacted with the lab results as soon as they are available. The fastest way to get your results is to activate your My Chart account. Instructions are located on the last page of this paperwork. If you have not heard from us regarding the results in 2 weeks, please contact this office.     We recommend that you schedule a mammogram for breast cancer screening. Typically, you do not need a referral to do this. Please contact a local imaging center to schedule your mammogram.  [facility] - [phone]  *ask for the Radiology Department [facility]) - [phone] or [phone]  [facility] - [phone] [facility] - [phone] [facility] - [phone]  *ask for the Radiology Department [facility] - [phone]  *ask for the Radiology Department [facility] - [phone]  *ask for the Mammography Department [facility] - [phone]  

## 2015-10-25 ENCOUNTER — Other Ambulatory Visit: Payer: Self-pay | Admitting: Radiology

## 2015-10-30 ENCOUNTER — Other Ambulatory Visit: Payer: Self-pay | Admitting: Radiology

## 2015-10-30 DIAGNOSIS — C50911 Malignant neoplasm of unspecified site of right female breast: Secondary | ICD-10-CM

## 2015-10-30 DIAGNOSIS — C773 Secondary and unspecified malignant neoplasm of axilla and upper limb lymph nodes: Principal | ICD-10-CM

## 2015-11-02 ENCOUNTER — Encounter: Payer: Self-pay | Admitting: *Deleted

## 2015-11-02 ENCOUNTER — Telehealth: Payer: Self-pay | Admitting: *Deleted

## 2015-11-02 DIAGNOSIS — C50911 Malignant neoplasm of unspecified site of right female breast: Secondary | ICD-10-CM

## 2015-11-02 DIAGNOSIS — C773 Secondary and unspecified malignant neoplasm of axilla and upper limb lymph nodes: Principal | ICD-10-CM

## 2015-11-02 DIAGNOSIS — C50919 Malignant neoplasm of unspecified site of unspecified female breast: Secondary | ICD-10-CM | POA: Insufficient documentation

## 2015-11-02 HISTORY — DX: Malignant neoplasm of unspecified site of unspecified female breast: C50.919

## 2015-11-02 NOTE — Telephone Encounter (Signed)
Confirmed [facility] for 11/08/15 at 1215 .  Instructions and contact information given.

## 2015-11-06 ENCOUNTER — Ambulatory Visit
Admission: RE | Admit: 2015-11-06 | Discharge: 2015-11-06 | Disposition: A | Discharge status: Home / Self Care | Payer: BLUE CROSS/BLUE SHIELD | Source: Ambulatory Visit | Attending: Radiology | Admitting: Radiology

## 2015-11-06 ENCOUNTER — Telehealth: Payer: Self-pay | Admitting: Physician Assistant

## 2015-11-06 DIAGNOSIS — C773 Secondary and unspecified malignant neoplasm of axilla and upper limb lymph nodes: Principal | ICD-10-CM

## 2015-11-06 DIAGNOSIS — C50911 Malignant neoplasm of unspecified site of right female breast: Secondary | ICD-10-CM

## 2015-11-06 MED ORDER — GADOBENATE DIMEGLUMINE 529 MG/ML IV SOLN: 8.0000 mL | Freq: Once | INTRAVENOUS | Status: DC | PRN

## 2015-11-06 NOTE — Telephone Encounter (Signed)
Spoke with patient. Let her know that I'm thinking about her and that she can let me know if she needs anything from primary care standpoint. She will see oncology on 6/28. MRI is today.

## 2015-11-08 ENCOUNTER — Ambulatory Visit
Admission: RE | Admit: 2015-11-08 | Discharge: 2015-11-08 | Disposition: A | Discharge status: Home / Self Care | Payer: BLUE CROSS/BLUE SHIELD | Source: Ambulatory Visit | Attending: Radiation Oncology | Admitting: Radiation Oncology

## 2015-11-08 ENCOUNTER — Other Ambulatory Visit (HOSPITAL_BASED_OUTPATIENT_CLINIC_OR_DEPARTMENT_OTHER): Payer: BLUE CROSS/BLUE SHIELD

## 2015-11-08 ENCOUNTER — Telehealth: Payer: Self-pay | Admitting: Oncology

## 2015-11-08 ENCOUNTER — Ambulatory Visit (HOSPITAL_BASED_OUTPATIENT_CLINIC_OR_DEPARTMENT_OTHER): Payer: BLUE CROSS/BLUE SHIELD | Admitting: Oncology

## 2015-11-08 ENCOUNTER — Encounter: Payer: Self-pay | Admitting: Oncology

## 2015-11-08 ENCOUNTER — Ambulatory Visit: Payer: Self-pay | Admitting: Surgery

## 2015-11-08 ENCOUNTER — Encounter: Payer: Self-pay | Admitting: Physical Therapy

## 2015-11-08 ENCOUNTER — Ambulatory Visit: Payer: BLUE CROSS/BLUE SHIELD | Attending: Surgery | Admitting: Physical Therapy

## 2015-11-08 VITALS — BP 135/62 | HR 108 | Temp 98.4°F | Ht 64.0 in | Wt 177.3 lb

## 2015-11-08 DIAGNOSIS — M25511 Pain in right shoulder: Secondary | ICD-10-CM

## 2015-11-08 DIAGNOSIS — C50611 Malignant neoplasm of axillary tail of right female breast: Secondary | ICD-10-CM | POA: Insufficient documentation

## 2015-11-08 DIAGNOSIS — C773 Secondary and unspecified malignant neoplasm of axilla and upper limb lymph nodes: Secondary | ICD-10-CM

## 2015-11-08 DIAGNOSIS — M25512 Pain in left shoulder: Secondary | ICD-10-CM | POA: Diagnosis present

## 2015-11-08 DIAGNOSIS — C50911 Malignant neoplasm of unspecified site of right female breast: Secondary | ICD-10-CM | POA: Diagnosis not present

## 2015-11-08 DIAGNOSIS — R293 Abnormal posture: Secondary | ICD-10-CM | POA: Insufficient documentation

## 2015-11-08 DIAGNOSIS — M25611 Stiffness of right shoulder, not elsewhere classified: Secondary | ICD-10-CM

## 2015-11-08 DIAGNOSIS — Z808 Family history of malignant neoplasm of other organs or systems: Secondary | ICD-10-CM | POA: Diagnosis not present

## 2015-11-08 DIAGNOSIS — M25612 Stiffness of left shoulder, not elsewhere classified: Secondary | ICD-10-CM | POA: Diagnosis present

## 2015-11-08 LAB — COMPREHENSIVE METABOLIC PANEL
ALT: 34 U/L (ref 0–55)
AST: 27 U/L (ref 5–34)
Albumin: 4.4 g/dL (ref 3.5–5.0)
Alkaline Phosphatase: 54 U/L (ref 40–150)
Anion Gap: 13 mEq/L — ABNORMAL HIGH (ref 3–11)
BUN: 22.7 mg/dL (ref 7.0–26.0)
CALCIUM: 10 mg/dL (ref 8.4–10.4)
CHLORIDE: 103 meq/L (ref 98–109)
CO2: 21 meq/L — AB (ref 22–29)
CREATININE: 1.4 mg/dL — AB (ref 0.6–1.1)
EGFR: 40 mL/min/{1.73_m2} — ABNORMAL LOW (ref >90)
Glucose: 192 mg/dl — ABNORMAL HIGH (ref 70–140)
POTASSIUM: 4.2 meq/L (ref 3.5–5.1)
SODIUM: 136 meq/L (ref 136–145)
Total Bilirubin: 0.55 mg/dL (ref 0.20–1.20)
Total Protein: 8 g/dL (ref 6.4–8.3)

## 2015-11-08 LAB — CBC WITH DIFFERENTIAL/PLATELET
BASO%: 0.7 % (ref 0.0–2.0)
BASOS ABS: 0.1 10*3/uL (ref 0.0–0.1)
EOS%: 0.2 % (ref 0.0–7.0)
Eosinophils Absolute: 0 10*3/uL (ref 0.0–0.5)
HEMATOCRIT: 37.8 % (ref 34.8–46.6)
HGB: 12.4 g/dL (ref 11.6–15.9)
LYMPH#: 1.5 10*3/uL (ref 0.9–3.3)
LYMPH%: 17.5 % (ref 14.0–49.7)
MCH: 28.8 pg (ref 25.1–34.0)
MCHC: 32.9 g/dL (ref 31.5–36.0)
MCV: 87.6 fL (ref 79.5–101.0)
MONO#: 0.4 10*3/uL (ref 0.1–0.9)
MONO%: 4.6 % (ref 0.0–14.0)
NEUT#: 6.8 10*3/uL — ABNORMAL HIGH (ref 1.5–6.5)
NEUT%: 77 % — AB (ref 38.4–76.8)
Platelets: 259 10*3/uL (ref 145–400)
RBC: 4.31 10*6/uL (ref 3.70–5.45)
RDW: 13.3 % (ref 11.2–14.5)
WBC: 8.8 10*3/uL (ref 3.9–10.3)

## 2015-11-08 NOTE — Telephone Encounter (Signed)
appt made and avs printed °

## 2015-11-08 NOTE — Patient Instructions (Signed)

## 2015-11-08 NOTE — Progress Notes (Signed)
Radiation Oncology         [phone] ________________________________  Initial outpatient Consultation  Name: Gina Navarro MRN: [id number]  Date: 11/08/2015  DOB: 1950-08-19  NT:[id number] [name], PA-C  Erroll Luna, MD   REFERRING PHYSICIAN: Erroll Luna, MD  DIAGNOSIS:    ICD-9-CM ICD-10-CM   1. 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 female who presented with a right axillary mass that she self-palpated. Ultrasound revealed multiple abnormal lymph nodes with the largest measuring to be 5.7 cm.  Breast ultrasound was negative for parenchymal lesions in the breast itself. MRI was conducted of the breasts which revealed multiple abnormal right axillary nodes however breast tissue was in normal limits. Biopsy of a right axillary lymph node revealed metastatic carcinoma consistent with breast primary, this was ER 95 % PR 10% and HER2 negative, Ki-67 30%. The pathology note indicates negative e-cadherin and which supports an invasive lobular carcinoma primary.   The patient underwent bilateral breast MRI on 11/06/15 which found no evidence of malignancy in either breast or left axillary lymph nodes.   On today's visit, denies significant axillary swelling or pain. Quit smoking at the age of 34.   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 [employer].  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    PAST SURGICAL HISTORY: Past Surgical History  Procedure Laterality Date  . Tonsillectomy and adenoidectomy    . Mohs surgery  2002    chin x 1 and right forehead X 1 in 2004    FAMILY HISTORY: family history includes Brain cancer in her father; Liver cancer in her mother; Lung cancer in her mother. She was adopted.  SOCIAL HISTORY:  reports that she has quit smoking. Her smoking use included Cigarettes. She has a 5 pack-year smoking history. She has never used smokeless tobacco. She reports that she does not drink alcohol or use illicit drugs.  ALLERGIES: Bee venom; Statins; and Victoza  MEDICATIONS:  Current Outpatient Prescriptions  Medication Sig Dispense Refill  . aspirin 81 MG tablet Take 81 mg by mouth daily.    . canagliflozin (INVOKANA) 300 MG TABS tablet Take 1 tablet (300 mg total) by mouth daily before breakfast. 90 tablet 3  . cholecalciferol (VITAMIN D) 1000 UNITS tablet Take 1,000 Units by mouth daily.    . Evening Primrose topical oil Apply topically as needed for dry skin.    Marland Kitchen glucosamine-chondroitin 500-400 MG tablet Take 1 tablet by mouth 3 (three) times daily.    Marland Kitchen lisinopril-hydrochlorothiazide (PRINZIDE,ZESTORETIC) 20-25 MG tablet TAKE 1 TABLET BY MOUTH DAILY 90 tablet 3  . metFORMIN (GLUCOPHAGE) 1000 MG tablet Take 1 tablet (1,000 mg total)  By mouth 2 (two) times daily with a meal 180 tablet 3  . nitroGLYCERIN (NITROSTAT) 0.4 MG SL tablet Place 1 tablet (0.4 mg total) under the tongue every 5 (five) minutes as needed for chest pain. (Patient not taking: Reported on 11/08/2015) 25 tablet 1  .  OVER THE COUNTER MEDICATION Take 1 tablet by mouth 2 (two) times daily. Reported on 10/24/2015    . Turmeric Curcumin 500 MG CAPS Take 1 capsule by mouth 2 (two) times daily.     No current facility-administered medications for this encounter.    REVIEW OF SYSTEMS:  Notable for that above.  A 15 point ROS was performed by nursing. Positive for wearing glasses,  SOB when ambulating stairs, joint pain, arthritis and diabetes (all chronic issues, thought SOB is going on now for 2 years). Otherwise, negative.    PHYSICAL EXAM:  Vitals with Age-Percentiles 7/67/3419  Length   Systolic 379  Diastolic 62  Pulse 024  Respiration   Weight   BMI    General: Alert and oriented, in no acute distress HEENT: Head is normocephalic. Extraocular movements are intact. Oropharynx is clear.  Neck: Neck is supple, no palpable cervical or supraclavicular lymphadenopathy.  Heart: Regular in rate and rhythm with no murmurs, rubs, or gallops.  Chest: Clear to auscultation bilaterally, with no rhonchi, wheezes, or rales.  Abdomen: Soft, nontender, nondistended, with no rigidity or guarding. Extremities: No cyanosis or edema. Lymphatics: see Neck Exam Skin: No concerning lesions. Musculoskeletal: symmetric strength and muscle tone throughout. Neurologic: Cranial nerves II through XII are grossly intact. No obvious focalities. Speech is fluent. Coordination is intact. Psychiatric: Judgment and insight are intact. Affect is appropriate. Breasts: No palpable masses are appreciated in the left breast and left axilla. Visible swelling in right axilla and the dominant mass is approximately 5 cm in greatest dimension. I am not able to appreciate any other obvious masses in the right axilla. No palpable right breast masses.    ECOG = 1   0 - Asymptomatic (Fully active, able to carry on all predisease activities without restriction)  1 - Symptomatic but completely ambulatory (Restricted in physically strenuous activity but ambulatory and able to carry out work of a light or sedentary nature. For example, light housework, office work)  2 - Symptomatic, <50% in bed during the day (Ambulatory and capable of all self care but unable to carry out any work activities. Up and about more than 50% of waking hours)  3 - Symptomatic, >50% in bed, but not bedbound (Capable of only  limited self-care, confined to bed or chair 50% or more of waking hours)  4 - Bedbound (Completely disabled. Cannot carry on any self-care. Totally confined to bed or chair)  5 - Death   Eustace Pen MM, Creech RH, Tormey DC, et al. [phone]). "Toxicity and response criteria of the [facility]". [place] Oncol. 5 (6): 649-55   LABORATORY DATA:  Lab Results  Component Value Date   WBC 8.8 11/08/2015   HGB 12.4 11/08/2015   HCT 37.8 11/08/2015   MCV 87.6 11/08/2015   PLT 259 11/08/2015   CMP     Component Value Date/Time   NA 136 11/08/2015 1233   NA 135 08/15/2015 1104   K 4.2 11/08/2015 1233   K 4.3 08/15/2015 1104   CL 106 08/15/2015 1104   CO2 21* 11/08/2015 1233   CO2 15* 08/15/2015 1104   GLUCOSE 192* 11/08/2015 1233   GLUCOSE 131* 08/15/2015 1104   BUN 22.7 11/08/2015 1233   BUN 33* 08/15/2015 1104   CREATININE 1.4* 11/08/2015 1233   CREATININE 1.34* 08/15/2015 1104   CALCIUM 10.0 11/08/2015 1233   CALCIUM 9.6 08/15/2015 1104   PROT 8.0 11/08/2015 1233   PROT 7.5 08/15/2015 1104  ALBUMIN 4.4 11/08/2015 1233   ALBUMIN 4.6 08/15/2015 1104   AST 27 11/08/2015 1233   AST 22 08/15/2015 1104   ALT 34 11/08/2015 1233   ALT 31* 08/15/2015 1104   ALKPHOS 54 11/08/2015 1233   ALKPHOS 55 08/15/2015 1104   BILITOT 0.55 11/08/2015 1233   BILITOT 0.5 08/15/2015 1104   GFRNONAA 67 08/31/2013 1007   GFRAA 77 08/31/2013 1007       RADIOGRAPHY: Mr Breast Bilateral W Wo Contrast  11/07/2015  CLINICAL DATA:  Malignant right axillary lymphadenopathy, suggestive of presence of lobular carcinoma of the breast. LABS:  GFR 38. EXAM: BILATERAL BREAST MRI WITH AND WITHOUT CONTRAST TECHNIQUE: Multiplanar, multisequence MR images of both breasts were obtained prior to and following the intravenous administration of 8 ml of MultiHance. THREE-DIMENSIONAL MR IMAGE RENDERING ON INDEPENDENT WORKSTATION: Three-dimensional MR images were rendered by post-processing of the  original MR data on an independent workstation. The three-dimensional MR images were interpreted, and findings are reported in the following complete MRI report for this study. Three dimensional images were evaluated at the independent DynaCad workstation COMPARISON:  Mammograms 10/25/2015 and priors. FINDINGS: Breast composition: b. Scattered fibroglandular tissue. Background parenchymal enhancement: Moderate. Right breast: No mass or abnormal enhancement. Numerous 1-2 mm progressively enhancing foci are seen throughout the breast, demonstrating enhancement below threshold for abnormal enhancement. Left breast: No mass or abnormal enhancement. Lymph nodes: There are 6 enlarged grossly abnormal lymph nodes in the right axilla. Ancillary findings:  None. IMPRESSION: No MRI evidence of malignancy in either breast. Marked right axillary lymphadenopathy. No evidence of left axillary lymphadenopathy. RECOMMENDATION: Clinical follow-up for biopsy-proven malignant right axillary lymphadenopathy. BI-RADS CATEGORY  6: Known biopsy-proven malignancy. Electronically Signed   By: Fidela Salisbury M.D.   On: 11/07/2015 15:29      IMPRESSION/PLAN: The patient was discussed at tumor board this morning for recommendation of staging PET or CT scan. The patient is scheduled to begin neoadjuvant chemotherapy on 11/23/15 assuming her disease is stage III on CT or PET. This will be followed by ALND surgery, then radiation therapy.   It was a pleasure meeting the patient today. We discussed the risks, benefits, and side effects of radiotherapy. I recommend radiotherapy to the right axilla , right breast, and additional regional nodes, including the right SCV, to reduce her risk of locoregional recurrence by 2/3.  We discussed that radiation would take approximately 6 weeks to complete and that I would give the patient a few weeks to heal following surgery before starting treatment planning.   We spoke about acute effects including  skin irritation and fatigue as well as much less common late effects including internal organ injury or irritation. We spoke about the latest technology that is used to minimize the risk of late effects for patients undergoing radiotherapy to the breast or chest wall. No guarantees of treatment were given. The patient is enthusiastic about proceeding with treatment. I look forward to participating in the patient's care.  __________________________________________   Eppie Gibson, MD  This document serves as a record of services personally performed by Eppie Gibson, MD. It was created on her behalf by Derek Mound, a trained medical scribe. The creation of this record is based on the scribe's personal observations and the provider's statements to them. This document has been checked and approved by the attending provider.

## 2015-11-08 NOTE — H&P (Signed)
Gina Navarro. Mccannon 11/08/2015 8:10 AM Location: [facility] Patient #: [id number] DOB: 1950-11-30 Undefined / Language: Gina Navarro / Race: White Female  History of Present Illness Gina Navarro A. Ramzi Brathwaite MD; 11/08/2015 2:38 PM) Patient words: patient sent at the request of Dr. [name] for a lump in her right axilla. She noticed this a few months ago and told her primary care doctor about this. She underwent a mammogram and ultrasound which showed a 5 cm enlarged right axillary lymph node and other bulky lymph nodes. Core biopsy was done showed a malignancy consistent with breast cancer. This was ER positive PR positive HER-2/neu negativewith a proliferation rate of 30%. MRI was done which showed no breast primary. Patignt denies any history of weight loss, arm swelling, chest pain, shortness of breath or other symptom. Denies any breast complaints.  The patient is a 65 year old female.   Other Problems [name], RN; 11/08/2015 8:10 AM) Arthritis Cancer Diabetes Mellitus High blood pressure Hypercholesterolemia  Past Surgical History [name], RN; 11/08/2015 8:10 AM) Tonsillectomy  Diagnostic Studies History [name], RN; 11/08/2015 8:10 AM) Colonoscopy never Mammogram within last year Pap Smear 1-5 years ago  Medication History [name], RN; 11/08/2015 8:11 AM) No Current Medications Medications Reconciled  Social History [name], RN; 11/08/2015 8:10 AM) Caffeine use Carbonated beverages. No alcohol use No drug use Tobacco use Former smoker.  Family History [name], RN; 11/08/2015 8:10 AM) Family history unknown First Degree Relatives  Pregnancy / Birth History [name], RN; 11/08/2015 8:10 AM) Age at menarche 49 years. Age of menopause [age] Gravida 0     Review of Systems [name] RN; 11/08/2015 8:10 AM) General Not Present- Appetite Loss, Chills, Fatigue, Fever, Night Sweats, Weight Gain and Weight  Loss. Skin Not Present- Change in Wart/Mole, Dryness, Hives, Jaundice, New Lesions, Non-Healing Wounds, Rash and Ulcer. HEENT Present- Seasonal Allergies and Wears glasses/contact lenses. Not Present- Earache, Hearing Loss, Hoarseness, Nose Bleed, Oral Ulcers, Ringing in the Ears, Sinus Pain, Sore Throat, Visual Disturbances and Yellow Eyes. Respiratory Not Present- Bloody sputum, Chronic Cough, Difficulty Breathing, Snoring and Wheezing. Breast Present- Breast Mass. Not Present- Breast Pain, Nipple Discharge and Skin Changes. Cardiovascular Not Present- Chest Pain, Difficulty Breathing Lying Down, Leg Cramps, Palpitations, Rapid Heart Rate, Shortness of Breath and Swelling of Extremities. Gastrointestinal Not Present- Abdominal Pain, Bloating, Bloody Stool, Change in Bowel Habits, Chronic diarrhea, Constipation, Difficulty Swallowing, Excessive gas, Gets full quickly at meals, Hemorrhoids, Indigestion, Nausea, Rectal Pain and Vomiting. Female Genitourinary Present- Urgency. Not Present- Frequency, Nocturia, Painful Urination and Pelvic Pain. Musculoskeletal Present- Joint Pain and Joint Stiffness. Not Present- Back Pain, Muscle Pain, Muscle Weakness and Swelling of Extremities. Neurological Not Present- Decreased Memory, Fainting, Headaches, Numbness, Seizures, Tingling, Tremor, Trouble walking and Weakness. Psychiatric Not Present- Anxiety, Bipolar, Change in Sleep Pattern, Depression, Fearful and Frequent crying. Endocrine Not Present- Cold Intolerance, Excessive Hunger, Hair Changes, Heat Intolerance, Hot flashes and New Diabetes. Hematology Not Present- Blood Thinners, Easy Bruising, Excessive bleeding, Gland problems, HIV and Persistent Infections.   Physical Exam (Quavon Keisling A. Lupita Rosales MD; 11/08/2015 2:39 PM)  General Mental Status-Alert. General Appearance-Consistent with stated age. Hydration-Well hydrated. Voice-Normal.  Head and Neck Head-normocephalic, atraumatic with no  lesions or palpable masses. Trachea-midline. Thyroid Gland Characteristics - normal size and consistency.  Chest and Lung Exam Chest and lung exam reveals -quiet, even and easy respiratory effort with no use of accessory muscles and on auscultation, normal breath sounds, no adventitious sounds and normal vocal resonance.  Inspection Chest Wall - Normal. Back - normal.  Breast Breast - Left-Symmetric, Non Tender, No Biopsy scars, no Dimpling, No Inflammation, No Lumpectomy scars, No Mastectomy scars, No Peau d' Orange. Breast - Right-Symmetric, Non Tender, No Biopsy scars, no Dimpling, No Inflammation, No Lumpectomy scars, No Mastectomy scars, No Peau d' Orange. Breast Lump-No Palpable Breast Mass.  Cardiovascular Cardiovascular examination reveals -normal heart sounds, regular rate and rhythm with no murmurs and normal pedal pulses bilaterally.  Neurologic Neurologic evaluation reveals -alert and oriented x 3 with no impairment of recent or remote memory. Mental Status-Normal.  Musculoskeletal Normal Exam - Left-Upper Extremity Strength Normal and Lower Extremity Strength Normal. Normal Exam - Right-Upper Extremity Strength Normal and Lower Extremity Strength Normal.  Lymphatic Head & Neck  General Head & Neck Lymphatics: Bilateral - Description - Normal. Axillary -Note:left axilla normal. bulky right axillary adenopathy was fixed nodes.     Assessment & Plan (Avyana Puffenbarger A. Catrena Vari MD; 11/08/2015 2:41 PM)  BREAST CANCER, STAGE 3, RIGHT (C50.911) Impression: patient has known breast primary. Bulky right axillary adenopathy and therefore will require chemotal oncology.eratively. She is being seen by medical oncology. She ressed this with her as well as a potential complicationsthis with her as well as a potential complications placement include bleeding, infection, collapsed lung, bleeding around the lung, injury to mediastinal structures, injury to heart, injury  to blood vessels in her neck, catheter migration, catheter function, and the need for other treatments and/or surgeries. Other potential complications of blood clots, stroke, heart attack and death.  Current Plans Pt Education - [facility] Breast Cancer Information Given - [facility] "Breast Journey" Package You are being scheduled for surgery - Our schedulers will call you.  You should hear from our office's scheduling department within 5 working days about the location, date, and time of surgery. We try to make accommodations for patient's preferences in scheduling surgery, but sometimes the OR schedule or the surgeon's schedule prevents [place] from making those accommodations.  If you have not heard from our office [phone]) in 5 working days, call the office and ask for your surgeon's nurse.  If you have other questions about your diagnosis, plan, or surgery, call the office and ask for your surgeon's nurse.  Use of a central venous catheter for intravenous therapy was discussed. Technique of catheter placement using ultrasound and fluoroscopy guidance was discussed. Risks such as bleeding, infection, pneumothorax, catheter occlusion, reoperation, and other risks were discussed. I noted a good likelihood this will help address the problem. Questions were answered. The patient expressed understanding & wishes to proceed.

## 2015-11-08 NOTE — Progress Notes (Signed)
[facility]  [phone] [phone]     ID: Gina Navarro DOB: 02/24/1951  MR#: [id number]  GLO#:[id number]  Patient Care Team: Harrison Mons, PA-C as PCP - General (Physician Assistant) Jari Pigg, MD as Attending Physician (Dermatology) Kem Boroughs, FNP as Nurse Practitioner (Obstetrics and Gynecology) Adrian Prows, MD as Consulting Physician (Cardiology) Erroll Luna, MD as Consulting Physician (General Surgery) Chauncey Cruel, MD as Consulting Physician (Oncology) Eppie Gibson, MD as Attending Physician (Radiation Oncology) PCP: Harrison Mons, PA-C Chauncey Cruel, MD OTHER MD:  CHIEF COMPLAINT: Estrogen receptor positive breast cancer  CURRENT TREATMENT: Neoadjuvant chemotherapy   BREAST CANCER HISTORY: "Gina Navarro" noted a mass in her right axilla 10/20/2015 and brought it to medical attention. She was scheduled for bilateral diagnostic mammography with tomography at [facility] 10/24/2015. The breast composition was category B. Mammogram of the breast showed no suspicious masses. However there was palpable right axillary adenopathy, more specifically a 5 cm "lump beneath the anterior right axilla, and ultrasound was obtained.. On ultrasound, the breast itself was unremarkable. However there were multiple enlarged right axillary lymph nodes the largest measuring 5.7 cm.   Biopsy of one of the enlarged right axillary lymph nodes on 10/25/2015 showed metastatic carcinoma, which was cytokeratin 7 positive, estrogen receptor 95% positive, progesterone receptor 10% positive, both with strong staining intensity, and HER-2 nonamplified, with a signals ratio 1.62, and the number percell 2.10.  MRI of the breasts 11/06/2015 again showed no mass or abnormal enhancement in the right breast or left breast. In the right axilla there were 6 enlarged grossly abnormal lymph nodes.  The patient's subsequent history is as detailed below  INTERVAL HISTORY: Gina Navarro was  evaluated in the multidisciplinary breast cancer clinic 10/31/2015. Her case was also presented in the multidisciplinary breast cancer conference that same morning. At that time a preliminary plan was proposed: Neoadjuvant chemotherapy, followed by axillary lymph node dissection, radiation to include the breast, and anti-estrogens. Staging with a PET CT scan was also recommended, and also consideration of genetics testing  REVIEW OF SYSTEMS: There were no specific symptoms leading to the original mammogram, which was routinely scheduled. The patient denies unusual headaches, visual changes, nausea, vomiting, stiff neck, dizziness, or gait imbalance. There has been no cough, phlegm production, or pleurisy, no chest pain or pressure, and no change in bowel or bladder habits. The patient denies fever, rash, bleeding, unexplained fatigue or unexplained weight loss. For exercise Gina Navarro walks perhaps 30 minutes 3-4 times a week A detailed review of systems was otherwise entirely negative.  PAST MEDICAL HISTORY: Past Medical History  Diagnosis Date  . Arthritis   . Menopausal state   . Hypertension 2006  . Diabetes mellitus without complication ([place]) 1884    oral meds until Victoza in 07/2012, now change to Invokana  . Breast cancer metastasized to axillary lymph node ([place]) 11/02/2015    PAST SURGICAL HISTORY: Past Surgical History  Procedure Laterality Date  . Tonsillectomy and adenoidectomy    . Mohs surgery  2002    chin x 1 and right forehead X 1 in 2004    FAMILY HISTORY Family History  Problem Relation Age of Onset  . Adopted: Yes  . Liver cancer Mother   . Lung cancer Mother   . Brain cancer Father   The patient is adopted and knows little about her biological family. Both are adopted parents have died. Her adopted Brother (not biologically related) Lowry Ram, lives in [place].  GYNECOLOGIC HISTORY:  Patient's last menstrual period was 05/13/1996. Menarche age 65, the  patient is GX P0. She went through menopause in her early 41s and did not take hormone replacement. She never used oral contraceptives.  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 She is contracted by group who contracts to the state. She is single and lives alone with 4 dogs. She has a large fasting yard. She attends the [employer]    ADVANCED DIRECTIVES: Not in place   HEALTH MAINTENANCE: Social History  Substance Use Topics  . Smoking status: Unknown If Ever Smoked  . Smokeless tobacco: Never Used  . Alcohol Use: No     Colonoscopy: Never  PAP:  Bone density: Never   Allergies  Allergen Reactions  . Bee Venom   . Statins Other (See Comments)    MYALGIAS with Crestor, Zocor. Tolerating Lipitor (05/2013)  . Victoza [Liraglutide]     Dizziness, peripheral neuropathy, constipation, heart palpitations    Current Outpatient Prescriptions  Medication Sig Dispense Refill  . aspirin 81 MG tablet Take 81 mg by mouth daily.    . canagliflozin (INVOKANA) 300 MG TABS tablet Take 1 tablet (300 mg total) by mouth daily before breakfast. 90 tablet 3  . cholecalciferol (VITAMIN D) 1000 UNITS tablet Take 1,000 Units by mouth daily.    . cholestyramine (QUESTRAN) 4 g packet Take 1 packet (4 g total) by mouth 3 (three) times daily with meals. 270 each 3  . Evening Primrose topical oil Apply topically as needed for dry skin.    . fish oil-omega-3 fatty acids 1000 MG capsule Take 2 g by mouth daily.    Marland Kitchen glucosamine-chondroitin 500-400 MG tablet Take 1 tablet by mouth 3 (three) times daily.    Marland Kitchen glucose blood (ONE TOUCH ULTRA TEST) test strip USE AS INSTRUCTED 100 each 0  . lisinopril-hydrochlorothiazide (PRINZIDE,ZESTORETIC) 20-25 MG tablet TAKE 1 TABLET BY MOUTH DAILY 90 tablet 3  . metFORMIN (GLUCOPHAGE) 1000 MG tablet Take 1 tablet  (1,000 mg total)  By mouth 2 (two) times daily with a meal 180 tablet 3  . nitroGLYCERIN (NITROSTAT) 0.4 MG SL tablet Place 1 tablet (0.4 mg total) under the tongue every 5 (five) minutes as needed for chest pain. 25 tablet 1  . OVER THE COUNTER MEDICATION Take 1 tablet by mouth 2 (two) times daily. Reported on 10/24/2015    . Turmeric Curcumin 500 MG CAPS Take 1 capsule by mouth 2 (two) times daily.     No current facility-administered medications for this visit.    OBJECTIVE: Middle-aged white woman in no acute distress Filed Vitals:   11/08/15 1256  BP: 135/62  Pulse: 108  Temp: 98.4 F (36.9 C)     There is no weight on file to calculate BMI.    ECOG FS:0 - Asymptomatic  Ocular: Sclerae unicteric, pupils equal, round and reactive to light Ear-nose-throat: Oropharynx clear and moist Lymphatic: No cervical or supraclavicular adenopathy Lungs no rales or rhonchi, good excursion bilaterally Heart regular rate and rhythm, no murmur appreciated Abd soft, nontender, positive bowel sounds MSK no focal spinal tenderness, no joint edema Neuro: non-focal, well-oriented, appropriate affect Breasts: The right breast is status post recent biopsy. There is a small ecchymosis. There are no skin or nipple changes of concern. The right axilla is benign. The left breast is unremarkable   LAB RESULTS:  CMP     Component Value Date/Time   NA 135 08/15/2015 1104   K 4.3 08/15/2015 1104   CL 106 08/15/2015 1104   CO2 15* 08/15/2015 1104   GLUCOSE 131* 08/15/2015 1104   BUN 33* 08/15/2015 1104   CREATININE 1.34* 08/15/2015 1104   CALCIUM 9.6 08/15/2015 1104   PROT 7.5 08/15/2015 1104   ALBUMIN 4.6 08/15/2015 1104   AST 22 08/15/2015 1104   ALT 31* 08/15/2015 1104   ALKPHOS 55 08/15/2015 1104   BILITOT 0.5 08/15/2015 1104   GFRNONAA 67 08/31/2013 1007   GFRAA 77 08/31/2013 1007    INo results found for: SPEP, UPEP  Lab Results  Component Value Date   WBC 8.8 11/08/2015   NEUTROABS  6.8* 11/08/2015   HGB 12.4 11/08/2015   HCT 37.8 11/08/2015   MCV 87.6 11/08/2015   PLT 259 11/08/2015      Chemistry      Component Value Date/Time   NA 135 08/15/2015 1104   K 4.3 08/15/2015 1104   CL 106 08/15/2015 1104   CO2 15* 08/15/2015 1104   BUN 33* 08/15/2015 1104   CREATININE 1.34* 08/15/2015 1104      Component Value Date/Time   CALCIUM 9.6 08/15/2015 1104   ALKPHOS 55 08/15/2015 1104   AST 22 08/15/2015 1104   ALT 31* 08/15/2015 1104   BILITOT 0.5 08/15/2015 1104       No results found for: LABCA2  No components found for: [id number]  No results for input(s): INR in the last 168 hours.  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 LABS:  GFR 38. EXAM: BILATERAL BREAST MRI WITH AND WITHOUT CONTRAST TECHNIQUE: Multiplanar, multisequence MR images of both breasts were obtained prior to and following the intravenous administration of 8 ml of MultiHance. THREE-DIMENSIONAL MR IMAGE RENDERING ON INDEPENDENT WORKSTATION: Three-dimensional MR images were rendered by post-processing of the original MR data on an independent workstation. The three-dimensional MR images were interpreted, and findings are reported in the following complete MRI report for this study. Three dimensional images were evaluated at the independent DynaCad workstation COMPARISON:  Mammograms 10/25/2015 and priors. FINDINGS: Breast composition: b. Scattered fibroglandular tissue. Background parenchymal enhancement: Moderate. Right breast: No mass or abnormal enhancement. Numerous 1-2 mm progressively enhancing foci are seen throughout the breast, demonstrating  enhancement below threshold for abnormal enhancement. Left breast: No mass or abnormal enhancement. Lymph nodes: There are 6 enlarged grossly abnormal lymph nodes in the right axilla. Ancillary findings:  None. IMPRESSION: No MRI evidence of malignancy in either breast. Marked right axillary lymphadenopathy. No evidence of left axillary lymphadenopathy. RECOMMENDATION: Clinical follow-up for biopsy-proven malignant right axillary lymphadenopathy. BI-RADS CATEGORY  6: Known biopsy-proven malignancy. Electronically Signed   By: Fidela Salisbury M.D.   On: 11/07/2015 15:29    ELIGIBLE FOR AVAILABLE RESEARCH PROTOCOL: no  ASSESSMENT: 65 y.o. [place] woman status post right axillary lymph node biopsy 10/25/2015 for a clinical  TX N2, stage IIIA invasive ductal carcinoma, estrogen and progesterone receptor positive, HER-2 nonamplified   (1) neoadjuvant chemotherapy starting 11/23/2015 will consist of cyclophosphamide and docetaxel in dose dense fashion 4 followed by weekly  paclitaxel 12  (2) axillary lymph node dissection to follow chemotherapy  (3) adjuvant radiation to follow surgery  (4) anti-estrogens to follow at the completion of local treatment  (5) genetics testing pending    PLAN: We spent the better part of today's hour-long appointment discussing the biology of breast cancer in general, and the specifics of the patient's tumor in particular. We first reviewed the fact that cancer is not one disease but more than 100 different diseases and that it is important to keep them separate-- otherwise when friends and relatives discuss their own cancer experiences with Gina Navarro confusion can result. Similarly we explained that if breast cancer spreads to the bone or liver, the patient would not have bone cancer or liver cancer, but breast cancer in the bone and breast cancer in the liver: one cancer in three places-- not 3 different cancers which otherwise would have to be treated in 3 different  ways.  Done's case is relatively unusual but we do see cases like hers or even MRI fails to show a lesion in the breast, but there is obvious breast cancer in the axilla. In the past 3 used to do mastectomies in these cases infrequently found no microscopic disease in the breast. The current standard of care is to not do a mastectomy in the breast but 2 proceed with radiation as one would for a lumpectomy. Of course she will have a complete axillary lymph node dissection for local control.  We discussed the difference between local and systemic therapy. Local therapy includes radiation and surgery. Systemic therapy in her case includes chemotherapy and anti-estrogens. We emphasized the fact that starting with chemotherapy and following with surgery, as compared to starting with surgery and following with chemotherapy, does not alter the overall result: The survival at 5 years is identical.  In her case we feel starting with chemotherapy is indicated, because it will make the axillary lymph node dissection easier, hopefully with reduced risk of permanent lymphedema, and also because it will give Gina Navarro time to check whether she qualifies for genetics testing, and if she gets tested and has a deleterious mutation, and to consider whether she wants bilateral mastectomies and reconstruction, as opposed to intensified screening.  The plan will be to start with chemotherapy, which will consist of doxorubicin and cyclophosphamide in dose dense fashion 4, followed by weekly paclitaxel 12 we discussed the possible toxicities, side effects and complications of these agents at length. She will need a port, and echocardiogram, and a visit with our chemotherapy teaching nurse. We are also scheduling a genetics appointment.  We are hoping to start chemotherapy on July 13. She will meet with me on 11/17/2015 to discuss anti-emetics and other supportive therapies  Gina Navarro has a good understanding of the overall plan. She  agrees with it. She knows the goal of treatment in her case is cure. She will call with any problems that may develop before her next visit here.  Chauncey Cruel, MD   11/08/2015 1:12 PM Medical Oncology and Hematology [facility] [address] [place], [place] [address] Tel. [phone]    Fax. [phone]

## 2015-11-08 NOTE — Therapy (Signed)
[place], [place], [address] Phone: [phone]   Fax:  [phone]  Physical Therapy Evaluation  Patient Details  Name: Gina Navarro MRN: [id number] Date of Birth: 20-Feb-1951 Referring Provider: Dr. Erroll Luna  Encounter Date: 11/08/2015      PT End of Session - 11/08/15 1542    Visit Number 1   Number of Visits 1   PT Start Time 8786   PT Stop Time 7672  Also saw pt from 0947 - 1500 for a total of 31 minutes   PT Time Calculation (min) 6 min   Activity Tolerance Patient tolerated treatment well   Behavior During Therapy [facility] for tasks assessed/performed      Past Medical History  Diagnosis Date  . Arthritis   . Menopausal state   . Hypertension 2006  . Diabetes mellitus without complication ([place]) 0962    oral meds until Victoza in 07/2012, now change to Invokana  . Breast cancer metastasized to axillary lymph node ([place]) 11/02/2015  . Skin cancer     Past Surgical History  Procedure Laterality Date  . Tonsillectomy and adenoidectomy    . Mohs surgery  2002    chin x 1 and right forehead X 1 in 2004    There were no vitals filed for this visit.       Subjective Assessment - 11/08/15 1550    Subjective Patient reports she is here today to be seen by her medical team for her newly diagnosed right breast cancer.   Pertinent History Patient was diagnosed on 10/25/15 with metastatic breast cancer in her right axillary lymph nodes.  She has no primary mass found but has multiple positive axillary lymph nodes with the largest measuring 5.7 cm.  It is 95% ER positive and 10% PR positive, HER2 negative with a Ki67 of 30%.  She also has complaints of bilateral shoulder arthritis which significantly limits her range of motion and function.  There is also concern about being able ot obtain positioning for surgery and radiation if ROM does not improve.   Patient Stated Goals Increase bilateral  shoulder ROM, learn lymphedema risk reduction practices, learn post op shoulder ROM HEP            [facility] PT Assessment - 11/08/15 0001    Assessment   Medical Diagnosis Right breast cancer; right shoulder ROM deficits   Referring Provider Dr. [name] [place]   Onset Date/Surgical Date 10/25/15   Hand Dominance Right   Prior Therapy none   Precautions   Precautions Other (comment)   Precaution Comments Active cancer right axillary nodes   Restrictions   Weight Bearing Restrictions No   Balance Screen   Has the patient fallen in the past 6 months No   Has the patient had a decrease in activity level because of a fear of falling?  No   Is the patient reluctant to leave their home because of a fear of falling?  No   Home Environment   Living Environment Private residence   Living Arrangements Alone   Available Help at Discharge Friend(s)   Prior Function   Level of Independence Independent   Vocation Full time employment   Surveyor, minerals in hospitals, [place] She walks 3-4 times per week for 30 minutes   Cognition   Overall Cognitive Status Within Functional Limits for tasks assessed   Posture/Postural Control   Posture/Postural Control Postural limitations  Postural Limitations Rounded Shoulders;Forward head   ROM / Strength   AROM / PROM / Strength AROM;Strength   AROM   AROM Assessment Site Shoulder;Cervical   Right/Left Shoulder Right;Left   Right Shoulder Extension 42 Degrees   Right Shoulder Flexion 92 Degrees   Right Shoulder ABduction 102 Degrees   Right Shoulder Internal Rotation 65 Degrees   Right Shoulder External Rotation 59 Degrees   Left Shoulder Extension 39 Degrees   Left Shoulder Flexion 99 Degrees   Left Shoulder ABduction 102 Degrees   Left Shoulder Internal Rotation 68 Degrees   Left Shoulder External Rotation 64 Degrees   Cervical Flexion WNL   Cervical Extension 50% limited   Cervical - Right Side  Bend 25% limited   Cervical - Left Side Bend 50% limited   Cervical - Right Rotation 25% limited   Cervical - Left Rotation 50% limited   Strength   Overall Strength Comments Bilateral shoulders grossly 3-/5 due to limited ROM           LYMPHEDEMA/ONCOLOGY QUESTIONNAIRE - 11/08/15 1527    Type   Cancer Type Right breast cancer   Lymphedema Assessments   Lymphedema Assessments Upper extremities   Right Upper Extremity Lymphedema   10 cm Proximal to Olecranon Process 30 cm   Olecranon Process 24.9 cm   10 cm Proximal to Ulnar Styloid Process 23.7 cm   Just Proximal to Ulnar Styloid Process 16.6 cm   Across Hand at [employer] 19.2 cm   At [place] of 2nd Digit 7 cm   Left Upper Extremity Lymphedema   10 cm Proximal to Olecranon Process 31.2 cm   Olecranon Process 26.7 cm   10 cm Proximal to Ulnar Styloid Process 22.9 cm   Just Proximal to Ulnar Styloid Process 16.9 cm   Across Hand at [employer] 19.3 cm   At [place] of 2nd Digit 6.4 cm       Patient was instructed today in a home exercise program today for post op shoulder range of motion. These included active assist shoulder flexion in sitting, scapular retraction, wall walking with shoulder abduction, and hands behind head external rotation.  She was encouraged to do these twice a day, holding 3 seconds and repeating 5 times when permitted by her physician.           PT Education - 11/08/15 1541    Education provided Yes   Education Details Lymphedema risk reduction and post op shoulder ROM HEP   Person(s) Educated Patient   Methods Explanation;Demonstration;Handout   Comprehension Verbalized understanding;Returned demonstration              Breast Clinic Goals - 11/08/15 1558    Patient will be able to verbalize understanding of pertinent lymphedema risk reduction practices relevant to her diagnosis specifically related to skin care.   Time 1   Period Days   Status Achieved   Patient will be able to  return demonstrate and/or verbalize understanding of the post-op home exercise program related to regaining shoulder range of motion.   Time 1   Period Days   Status Achieved   Patient will be able to verbalize understanding of the importance of attending the postoperative After Breast Cancer Class for further lymphedema risk reduction education and therapeutic exercise.   Time 1   Period Days   Status Achieved          Long Term Clinic Goals - 11/08/15 1558    CC Long Term  Goal  #1   Title Patient will be independent with her home exercise program for shoulder ROM   Time 4   Period Weeks   Status New   CC Long Term Goal  #2   Title Patient will increase bilateral shoulder flexion ROM to >/= 120 degrees actively to tolerate surgery positioning.   Baseline 90 on right, 99 on left   Time 4   Period Weeks   Status New   CC Long Term Goal  #3   Title Patient will increase bilateral shoulder abduction ROM to >/= 120 degrees actively to tolerate surgery positioning.   Baseline 102 bilaterally   Time 4   Period Weeks   Status New   CC Long Term Goal  #4   Title Patient will report bilateral shoulder pain has improved by >/= 25% to tolerate daily tasks with greater ease.   Time 4   Period Weeks   Status New            Plan - 11/08/15 1550    Clinical Impression Statement Patient was diagnosed on 10/25/15 with metastatic breast cancer in her right axillary lymph nodes.  She has no primary mass found but has multiple positive axillary lymph nodes with the largest measuring 5.7 cm.  It is 95% ER positive and 10% PR positive, HER2 negative with a Ki67 of 30%.  She also has complaints of bilateral shoulder arthritis which significantly limits her range of motion and function.  There is also concern about being able to obtain positioning for surgery and radiation if ROM does not improve.  Her multidisciplinary medical team met prior to her assessments to determine a recommended treatment  plan.  She is planning to undergo neoadjuvant chemotherapy followed by a right axillary lymph node dissection, radiation and anti-estrogen therapy.  She will benefit from physical therapy now to try to improve her shoulder ROM and will need PT after surgery to regain ROM back to baseline and to prevent lymphedema.  She also has peripheral neuropathy due to a previous medication side effect but does not want that addressed in PT at this time.  She plans to begin a more rigorous walking program to allow greater strength while undergoing chemotherapy and will let us know if her neuropathy interferes with that.  Due to living alone and her bilateral shoulder ROM deficits with pain, and due to her evolving medical situation, her evaluation is of moderate complexity.   Rehab Potential Good   Clinical Impairments Affecting Rehab Potential Active cancer   PT Frequency 2x / week   PT Duration 4 weeks   PT Treatment/Interventions ADLs/Self Care Home Management;Patient/family education;Passive range of motion;Electrical Stimulation;Moist Heat;Therapeutic exercise;Manual techniques;Cryotherapy   PT Next Visit Plan Begin PROM bilateral shoulders, pulleys, AAROM, heat as needed for pain reduction; may consider having pt come early for 15 min of heat?   PT Home Exercise Plan Issued post op shoulder ROM HEP   Consulted and Agree with Plan of Care Patient      Patient will benefit from skilled therapeutic intervention in order to improve the following deficits and impairments:  Decreased strength, Postural dysfunction, Decreased knowledge of precautions, Pain, Impaired UE functional use, Decreased range of motion  Visit Diagnosis: Stiffness of right shoulder, not elsewhere classified - Plan: PT plan of care cert/re-cert  Stiffness of left shoulder, not elsewhere classified - Plan: PT plan of care cert/re-cert  Pain in right shoulder - Plan: PT plan of care cert/re-cert  Pain in  left shoulder - Plan: PT plan of  care cert/re-cert  Abnormal posture - Plan: PT plan of care cert/re-cert  Carcinoma of right breast metastatic to axillary lymph node ([place]) - Plan: PT plan of care cert/re-cert   Patient will follow up at outpatient cancer rehab if needed following surgery.  If the patient requires physical therapy at that time, a specific plan will be dictated and sent to the referring physician for approval. The patient was educated today on appropriate basic range of motion exercises to begin post operatively and the importance of attending the After Breast Cancer class following surgery.  Patient was educated today on lymphedema risk reduction practices as it pertains to recommendations that will benefit the patient immediately following surgery.  She verbalized good understanding.  She will begin PT for bilateral shoulder ROM on 11/15/15.   Problem List Patient Active Problem List   Diagnosis Date Noted  . Breast cancer, right ([place]) 11/08/2015  . Cancer of axillary tail of right female breast ([place]) 11/08/2015  . Breast cancer metastasized to axillary lymph node ([place]) 11/02/2015  . BMI 30.0-30.9,adult 08/09/2014  . Carotid stenosis, bilateral 08/31/2013  . Exertional chest pain 08/31/2013  . Type 2 diabetes mellitus with peripheral neuropathy ([place]) 04/28/2012  . HTN (hypertension) 04/28/2012  . Hyperlipidemia LDL goal <70 04/28/2012    Annia Friendly, PT 11/08/2015 4:02 PM  [place], [place], [address] Phone: [phone]   Fax:  [phone]  Name: Gina Navarro MRN: [id number] Date of Birth: 29-Mar-1951

## 2015-11-09 ENCOUNTER — Other Ambulatory Visit: Payer: Self-pay | Admitting: *Deleted

## 2015-11-09 DIAGNOSIS — C50911 Malignant neoplasm of unspecified site of right female breast: Secondary | ICD-10-CM

## 2015-11-09 DIAGNOSIS — C773 Secondary and unspecified malignant neoplasm of axilla and upper limb lymph nodes: Principal | ICD-10-CM

## 2015-11-10 ENCOUNTER — Encounter: Payer: Self-pay | Admitting: General Practice

## 2015-11-10 NOTE — Progress Notes (Signed)
Spiritual Care Note  Reached Ms Pinela (who goes by [facility]) via phone because she was unable to meet [place] representative prior to leaving [facility].  She reports good support from church/minister, is aware of ongoing Support Team availability, and states no other needs at this time.  Please page if needs arise/circumstances change.  Thank you.  Isabel, [place], [facility] Pager [phone] Voicemail [phone]

## 2015-11-13 ENCOUNTER — Telehealth: Payer: Self-pay | Admitting: *Deleted

## 2015-11-13 ENCOUNTER — Encounter: Payer: Self-pay | Admitting: Oncology

## 2015-11-13 NOTE — Telephone Encounter (Signed)
Left message for a return phone call to follow up from [facility]. °

## 2015-11-13 NOTE — Progress Notes (Signed)
Left msg for pt to return my call to discuss copay assistance.  °

## 2015-11-14 ENCOUNTER — Other Ambulatory Visit: Payer: Self-pay | Admitting: Oncology

## 2015-11-15 ENCOUNTER — Ambulatory Visit: Payer: BLUE CROSS/BLUE SHIELD | Attending: Surgery | Admitting: Physical Therapy

## 2015-11-15 DIAGNOSIS — R293 Abnormal posture: Secondary | ICD-10-CM | POA: Diagnosis present

## 2015-11-15 DIAGNOSIS — M25611 Stiffness of right shoulder, not elsewhere classified: Secondary | ICD-10-CM | POA: Diagnosis not present

## 2015-11-15 DIAGNOSIS — M25511 Pain in right shoulder: Secondary | ICD-10-CM | POA: Insufficient documentation

## 2015-11-15 DIAGNOSIS — M25612 Stiffness of left shoulder, not elsewhere classified: Secondary | ICD-10-CM | POA: Insufficient documentation

## 2015-11-15 DIAGNOSIS — M25512 Pain in left shoulder: Secondary | ICD-10-CM | POA: Insufficient documentation

## 2015-11-15 NOTE — Therapy (Signed)
[place], [place], [address] Phone: [phone]   Fax:  [phone]  Physical Therapy Treatment  Patient Details  Name: Gina Navarro MRN: [id number] Date of Birth: 12/05/1950 Referring Provider: Dr. Erroll Luna  Encounter Date: 11/15/2015      PT End of Session - 11/15/15 1657    Visit Number --   Number of Visits 9   Date for PT Re-Evaluation 12/11/15   PT Start Time [id number]   PT Stop Time 1705  only 3 units is correct   PT Time Calculation (min) 55 min   Activity Tolerance Patient tolerated treatment well   Behavior During Therapy [facility] for tasks assessed/performed      Past Medical History  Diagnosis Date  . Arthritis   . Menopausal state   . Hypertension 2006  . Diabetes mellitus without complication ([place]) 123456    oral meds until Victoza in 07/2012, now change to Invokana  . Breast cancer metastasized to axillary lymph node ([place]) 11/02/2015  . Skin cancer     Past Surgical History  Procedure Laterality Date  . Tonsillectomy and adenoidectomy    . Mohs surgery  2002    chin x 1 and right forehead X 1 in 2004    There were no vitals filed for this visit.      Subjective Assessment - 11/15/15 1612    Subjective Supposed to start chemo next week for 20 weeks, then have surgery and then radiation.   Currently in Pain? Yes   Pain Score 3    Pain Location Axilla   Pain Orientation Right   Pain Descriptors / Indicators Other (Comment)  stinging   Aggravating Factors  worse at night when sleeping   Pain Relieving Factors tylenol                         [facility] Adult PT Treatment/Exercise - 11/15/15 0001    Exercises   Exercises Other Exercises;Shoulder   Shoulder Exercises: Pulleys   Flexion 2 minutes   ABduction 2 minutes   Shoulder Exercises: Therapy Ball   Flexion 10 reps   Shoulder Exercises: Stretch   Other Shoulder Stretches Reviewed MDC shoulder ROM  exercises, all 4-5 reps bilat.   Modalities   Modalities Cryotherapy   Cryotherapy   Number Minutes Cryotherapy 10 Minutes   Cryotherapy Location Shoulder  both   Type of Cryotherapy Ice pack   Manual Therapy   Manual Therapy Passive ROM   Passive ROM In supine to each shoulder into ER, abduction, and flexion with gentle stretch to patient tolerance.                      Breast Clinic Goals - 11/08/15 1558    Patient will be able to verbalize understanding of pertinent lymphedema risk reduction practices relevant to her diagnosis specifically related to skin care.   Time 1   Period Days   Status Achieved   Patient will be able to return demonstrate and/or verbalize understanding of the post-op home exercise program related to regaining shoulder range of motion.   Time 1   Period Days   Status Achieved   Patient will be able to verbalize understanding of the importance of attending the postoperative After Breast Cancer Class for further lymphedema risk reduction education and therapeutic exercise.   Time 1   Period Days   Status Achieved  [facility] Goals - 11/08/15 1558    CC Long Term Goal  #1   Title Patient will be independent with her home exercise program for shoulder ROM   Time 4   Period Weeks   Status New   CC Long Term Goal  #2   Title Patient will increase bilateral shoulder flexion ROM to >/= 120 degrees actively to tolerate surgery positioning.   Baseline 90 on right, 99 on left   Time 4   Period Weeks   Status New   CC Long Term Goal  #3   Title Patient will increase bilateral shoulder abduction ROM to >/= 120 degrees actively to tolerate surgery positioning.   Baseline 102 bilaterally   Time 4   Period Weeks   Status New   CC Long Term Goal  #4   Title Patient will report bilateral shoulder pain has improved by >/= 25% to tolerate daily tasks with greater ease.   Time 4   Period Weeks   Status New            Plan  - 11/15/15 1712    Clinical Impression Statement Patient did well with review of [place] HEP, with AA/ROM and P/ROM today.  Used cold pack to each shoulder at end of session due to c/o soreness with exercise, which she said felt good.   Rehab Potential Good   Clinical Impairments Affecting Rehab Potential Active cancer   PT Frequency 2x / week   PT Duration 4 weeks   PT Treatment/Interventions ADLs/Self Care Home Management;Patient/family education;Passive range of motion;Electrical Stimulation;Moist Heat;Therapeutic exercise;Manual techniques;Cryotherapy   PT Next Visit Plan Add to HEP as needed; P/AA/A/ROM to both shoulders.  Moist heat prior to and/or cold pack after session.   PT Home Exercise Plan Do MDC ROM exercises   Consulted and Agree with Plan of Care Patient      Patient will benefit from skilled therapeutic intervention in order to improve the following deficits and impairments:  Decreased strength, Postural dysfunction, Decreased knowledge of precautions, Pain, Impaired UE functional use, Decreased range of motion  Visit Diagnosis: Stiffness of right shoulder, not elsewhere classified  Stiffness of left shoulder, not elsewhere classified  Pain in right shoulder  Pain in left shoulder     Problem List Patient Active Problem List   Diagnosis Date Noted  . Breast cancer, right ([place]) 11/08/2015  . Cancer of axillary tail of right female breast ([place]) 11/08/2015  . Breast cancer metastasized to axillary lymph node ([place]) 11/02/2015  . BMI 30.0-30.9,adult 08/09/2014  . Carotid stenosis, bilateral 08/31/2013  . Exertional chest pain 08/31/2013  . Type 2 diabetes mellitus with peripheral neuropathy ([place]) 04/28/2012  . HTN (hypertension) 04/28/2012  . Hyperlipidemia LDL goal <70 04/28/2012    Zarinah Oviatt 11/15/2015, 5:20 PM  [place], [place], [address] Phone: [phone]   Fax:   [phone]  Name: RHILYN BERTIN MRN: [id number] Date of Birth: 07/18/1950    Serafina Royals, PT 11/15/2015 5:20 PM

## 2015-11-16 ENCOUNTER — Telehealth (HOSPITAL_COMMUNITY): Payer: Self-pay

## 2015-11-16 ENCOUNTER — Other Ambulatory Visit: Payer: BLUE CROSS/BLUE SHIELD

## 2015-11-16 ENCOUNTER — Encounter: Payer: Self-pay | Admitting: Oncology

## 2015-11-16 ENCOUNTER — Other Ambulatory Visit (HOSPITAL_COMMUNITY): Payer: Self-pay | Admitting: Surgery

## 2015-11-16 ENCOUNTER — Other Ambulatory Visit: Payer: Self-pay | Admitting: Oncology

## 2015-11-16 DIAGNOSIS — C50911 Malignant neoplasm of unspecified site of right female breast: Secondary | ICD-10-CM

## 2015-11-16 DIAGNOSIS — C773 Secondary and unspecified malignant neoplasm of axilla and upper limb lymph nodes: Principal | ICD-10-CM

## 2015-11-16 NOTE — Progress Notes (Signed)
Spoke to pt regarding copay assistance.  Pt feels she is overqualified for the [employer] and after hearing her income she would exceed their income criteria as well as the [employer].  I did enroll pt in the [place] First Step program.  Faxed signed form and activated card today.

## 2015-11-16 NOTE — Telephone Encounter (Signed)
Called to schedule port a cath placement, left message for pt to return call. AW

## 2015-11-17 ENCOUNTER — Encounter (HOSPITAL_COMMUNITY)
Admission: RE | Admit: 2015-11-17 | Discharge: 2015-11-17 | Disposition: A | Discharge status: Home / Self Care | Payer: BLUE CROSS/BLUE SHIELD | Source: Ambulatory Visit | Attending: Oncology | Admitting: Oncology

## 2015-11-17 ENCOUNTER — Encounter: Payer: Self-pay | Admitting: Physical Therapy

## 2015-11-17 ENCOUNTER — Ambulatory Visit (HOSPITAL_COMMUNITY)
Admission: RE | Admit: 2015-11-17 | Discharge: 2015-11-17 | Disposition: A | Discharge status: Home / Self Care | Payer: BLUE CROSS/BLUE SHIELD | Source: Ambulatory Visit | Attending: Oncology | Admitting: Oncology

## 2015-11-17 ENCOUNTER — Ambulatory Visit (HOSPITAL_BASED_OUTPATIENT_CLINIC_OR_DEPARTMENT_OTHER)
Admission: RE | Admit: 2015-11-17 | Discharge: 2015-11-17 | Disposition: A | Discharge status: Home / Self Care | Payer: BLUE CROSS/BLUE SHIELD | Source: Ambulatory Visit | Attending: Physician Assistant | Admitting: Physician Assistant

## 2015-11-17 ENCOUNTER — Encounter (HOSPITAL_COMMUNITY): Payer: Self-pay

## 2015-11-17 DIAGNOSIS — E119 Type 2 diabetes mellitus without complications: Secondary | ICD-10-CM | POA: Insufficient documentation

## 2015-11-17 DIAGNOSIS — C50911 Malignant neoplasm of unspecified site of right female breast: Secondary | ICD-10-CM | POA: Diagnosis present

## 2015-11-17 DIAGNOSIS — E785 Hyperlipidemia, unspecified: Secondary | ICD-10-CM | POA: Diagnosis not present

## 2015-11-17 DIAGNOSIS — I10 Essential (primary) hypertension: Secondary | ICD-10-CM | POA: Diagnosis not present

## 2015-11-17 DIAGNOSIS — R911 Solitary pulmonary nodule: Secondary | ICD-10-CM | POA: Insufficient documentation

## 2015-11-17 DIAGNOSIS — C773 Secondary and unspecified malignant neoplasm of axilla and upper limb lymph nodes: Secondary | ICD-10-CM | POA: Diagnosis not present

## 2015-11-17 DIAGNOSIS — I2510 Atherosclerotic heart disease of native coronary artery without angina pectoris: Secondary | ICD-10-CM | POA: Insufficient documentation

## 2015-11-17 DIAGNOSIS — I700 Atherosclerosis of aorta: Secondary | ICD-10-CM | POA: Diagnosis not present

## 2015-11-17 DIAGNOSIS — K8020 Calculus of gallbladder without cholecystitis without obstruction: Secondary | ICD-10-CM | POA: Diagnosis not present

## 2015-11-17 DIAGNOSIS — Z87891 Personal history of nicotine dependence: Secondary | ICD-10-CM | POA: Insufficient documentation

## 2015-11-17 LAB — ECHOCARDIOGRAM COMPLETE
AVLVOTPG: 5 mmHg
CHL CUP MV DEC (S): 182
CHL CUP STROKE VOLUME: 47 mL
E/e' ratio: 7.13
EWDT: 182 ms
FS: 33 % (ref 28–44)
IV/PV OW: 1.02
LA diam end sys: 36 mm
LA diam index: 1.94 cm/m2
LA vol: 39.2 mL
LASIZE: 36 mm
LAVOLA4C: 48.6 mL
LAVOLIN: 21.1 mL/m2
LDCA: 2.84 cm2
LV E/e' medial: 7.13
LV E/e'average: 7.13
LV PW d: 11.7 mm — AB (ref 0.6–1.1)
LV SIMPSON'S DISK: 64
LV e' LATERAL: 7.62 cm/s
LVDIAVOL: 74 mL (ref 46–106)
LVDIAVOLIN: 40 mL/m2
LVOT SV: 73 mL
LVOT VTI: 25.8 cm
LVOTD: 19 mm
LVOTPV: 112 cm/s
LVSYSVOL: 27 mL (ref 14–42)
LVSYSVOLIN: 14 mL/m2
MV pk A vel: 93.1 m/s
MV pk E vel: 54.3 m/s
PV Reg vel dias: 76 cm/s
RV LATERAL S' VELOCITY: 11.4 cm/s
RV TAPSE: 15.6 mm
TDI e' lateral: 7.62
TDI e' medial: 5.87

## 2015-11-17 MED ORDER — TECHNETIUM TC 99M MEDRONATE IV KIT
25.0000 | PACK | Freq: Once | INTRAVENOUS | Status: AC | PRN
  Administered 2015-11-17: 26.9 via INTRAVENOUS

## 2015-11-17 MED ORDER — IOPAMIDOL (ISOVUE-300) INJECTION 61%
100.0000 mL | Freq: Once | INTRAVENOUS | Status: AC | PRN
  Administered 2015-11-17: 80 mL via INTRAVENOUS

## 2015-11-17 NOTE — Progress Notes (Signed)
  Echocardiogram 2D Echocardiogram has been performed.  [name] 11/17/2015, 11:54 AM

## 2015-11-19 ENCOUNTER — Other Ambulatory Visit: Payer: Self-pay | Admitting: Oncology

## 2015-11-20 ENCOUNTER — Ambulatory Visit: Payer: BLUE CROSS/BLUE SHIELD | Admitting: Physical Therapy

## 2015-11-20 ENCOUNTER — Telehealth: Payer: Self-pay | Admitting: Oncology

## 2015-11-20 ENCOUNTER — Encounter: Payer: Self-pay | Admitting: *Deleted

## 2015-11-20 DIAGNOSIS — M25612 Stiffness of left shoulder, not elsewhere classified: Secondary | ICD-10-CM

## 2015-11-20 DIAGNOSIS — M25512 Pain in left shoulder: Secondary | ICD-10-CM

## 2015-11-20 DIAGNOSIS — M25511 Pain in right shoulder: Secondary | ICD-10-CM

## 2015-11-20 DIAGNOSIS — M25611 Stiffness of right shoulder, not elsewhere classified: Secondary | ICD-10-CM

## 2015-11-20 NOTE — Therapy (Signed)
[place], [place], [address] Phone: [phone]   Fax:  [phone]  Physical Therapy Treatment  Patient Details  Name: Gina Navarro MRN: [id number] Date of Birth: 1951/04/13 Referring Provider: Dr. Erroll Luna  Encounter Date: 11/20/2015      PT End of Session - 11/20/15 1650    Visit Number 3   Number of Visits 9   Date for PT Re-Evaluation 12/11/15   PT Start Time [id number]   PT Stop Time 1700  3 units is correct   PT Time Calculation (min) 55 min   Activity Tolerance Patient tolerated treatment well   Behavior During Therapy [facility] for tasks assessed/performed      Past Medical History  Diagnosis Date  . Arthritis   . Menopausal state   . Hypertension 2006  . Diabetes mellitus without complication ([place]) [id number]    oral meds until Victoza in 07/2012, now change to Invokana  . Breast cancer metastasized to axillary lymph node ([place]) 11/02/2015  . Skin cancer     Past Surgical History  Procedure Laterality Date  . Tonsillectomy and adenoidectomy    . Mohs surgery  2002    chin x 1 and right forehead X 1 in 2004    There were no vitals filed for this visit.      Subjective Assessment - 11/20/15 1608    Subjective Had to let my oldest dog go--he had a seizure or stroke.  Expects to start chemo on Friday but they haven't put the port in yet.  Went to chemo class.  Did okay after last visit, not noticing any pain wose than usual.   Currently in Pain? Yes   Pain Score 4    Pain Location Shoulder   Pain Orientation Right            [facility] PT Assessment - 11/20/15 0001    AROM   Right Shoulder Flexion 115 Degrees   Right Shoulder ABduction 102 Degrees   Left Shoulder Flexion 127 Degrees   Left Shoulder ABduction 105 Degrees  slight scaption                     [facility] Adult PT Treatment/Exercise - 11/20/15 0001    Shoulder Exercises: Standing   ABduction AAROM;Right;Left;5  reps;Other (comment)  with finger ladder; got to #27 on each   Shoulder Exercises: Pulleys   Flexion 2 minutes   ABduction 2 minutes   Shoulder Exercises: Therapy Ball   Flexion 10 reps   Modalities   Modalities Cryotherapy   Cryotherapy   Number Minutes Cryotherapy 10 Minutes   Cryotherapy Location Shoulder  both   Type of Cryotherapy Ice pack   Manual Therapy   Manual Therapy Myofascial release   Myofascial Release myofascial pulling of each UE with some movement into abduction, done in supine   Passive ROM In supine to each shoulder into ER, abduction, and flexion with gentle stretch to patient tolerance.   Prosthetics   Residual limb condition                         Breast Clinic Goals - 11/08/15 1558    Patient will be able to verbalize understanding of pertinent lymphedema risk reduction practices relevant to her diagnosis specifically related to skin care.   Time 1   Period Days   Status Achieved   Patient will be able to return demonstrate and/or verbalize  understanding of the post-op home exercise program related to regaining shoulder range of motion.   Time 1   Period Days   Status Achieved   Patient will be able to verbalize understanding of the importance of attending the postoperative After Breast Cancer Class for further lymphedema risk reduction education and therapeutic exercise.   Time 1   Period Days   Status Achieved          Long Term Clinic Goals - 11/08/15 1558    CC Long Term Goal  #1   Title Patient will be independent with her home exercise program for shoulder ROM   Time 4   Period Weeks   Status New   CC Long Term Goal  #2   Title Patient will increase bilateral shoulder flexion ROM to >/= 120 degrees actively to tolerate surgery positioning.   Baseline 90 on right, 99 on left   Time 4   Period Weeks   Status New   CC Long Term Goal  #3   Title Patient will increase bilateral shoulder abduction ROM to >/= 120 degrees  actively to tolerate surgery positioning.   Baseline 102 bilaterally   Time 4   Period Weeks   Status New   CC Long Term Goal  #4   Title Patient will report bilateral shoulder pain has improved by >/= 25% to tolerate daily tasks with greater ease.   Time 4   Period Weeks   Status New            Plan - 11/20/15 1651    Clinical Impression Statement Nice gains in active shoulder flexion, but little change in abduction to date.  Patient may be getting a portacath soon and then starting chemo.  Her AAROM looks better when using gym equipment than when therapist performs P/ROM in supine.  She reports benefit of cold pack at end of sessions.   Rehab Potential Good   Clinical Impairments Affecting Rehab Potential Active cancer   PT Frequency 2x / week   PT Duration 4 weeks   PT Treatment/Interventions ADLs/Self Care Home Management;Patient/family education;Passive range of motion;Electrical Stimulation;Moist Heat;Therapeutic exercise;Manual techniques;Cryotherapy   PT Next Visit Plan Add to HEP as needed; P/AA/A/ROM to both shoulders and cold pack after session.   PT Home Exercise Plan Do MDC ROM exercises   Consulted and Agree with Plan of Care Patient      Patient will benefit from skilled therapeutic intervention in order to improve the following deficits and impairments:  Decreased strength, Postural dysfunction, Decreased knowledge of precautions, Pain, Impaired UE functional use, Decreased range of motion  Visit Diagnosis: Stiffness of right shoulder, not elsewhere classified  Stiffness of left shoulder, not elsewhere classified  Pain in right shoulder  Pain in left shoulder     Problem List Patient Active Problem List   Diagnosis Date Noted  . Breast cancer, right ([place]) 11/08/2015  . Cancer of axillary tail of right female breast ([place]) 11/08/2015  . Breast cancer metastasized to axillary lymph node ([place]) 11/02/2015  . BMI 30.0-30.9,adult 08/09/2014  . Carotid  stenosis, bilateral 08/31/2013  . Exertional chest pain 08/31/2013  . Type 2 diabetes mellitus with peripheral neuropathy ([place]) 04/28/2012  . HTN (hypertension) 04/28/2012  . Hyperlipidemia LDL goal <70 04/28/2012    Sherryn Pollino 11/20/2015, 4:56 PM  [place], [place], [address] Phone: [phone]   Fax:  [phone]  Name: ESRA BARKS MRN: [id number] Date of Birth:  10-31-1950    Serafina Royals, PT 11/20/2015 4:56 PM

## 2015-11-20 NOTE — Telephone Encounter (Signed)
Spoke with patient to confirm 7/13 and 7/14 appt date/times per GM orders. OV appt on 7/13 due to overbooked, scheduled chemo on 7/14 due to being capped 7/13

## 2015-11-21 ENCOUNTER — Other Ambulatory Visit: Payer: Self-pay | Admitting: General Surgery

## 2015-11-22 ENCOUNTER — Ambulatory Visit: Payer: BLUE CROSS/BLUE SHIELD

## 2015-11-22 ENCOUNTER — Encounter (HOSPITAL_COMMUNITY): Payer: Self-pay

## 2015-11-22 ENCOUNTER — Ambulatory Visit (HOSPITAL_COMMUNITY)
Admission: RE | Admit: 2015-11-22 | Discharge: 2015-11-22 | Disposition: A | Discharge status: Home / Self Care | Payer: BLUE CROSS/BLUE SHIELD | Source: Ambulatory Visit | Attending: Surgery | Admitting: Surgery

## 2015-11-22 ENCOUNTER — Other Ambulatory Visit (HOSPITAL_COMMUNITY): Payer: Self-pay | Admitting: Surgery

## 2015-11-22 DIAGNOSIS — Z85828 Personal history of other malignant neoplasm of skin: Secondary | ICD-10-CM | POA: Insufficient documentation

## 2015-11-22 DIAGNOSIS — C773 Secondary and unspecified malignant neoplasm of axilla and upper limb lymph nodes: Secondary | ICD-10-CM | POA: Diagnosis not present

## 2015-11-22 DIAGNOSIS — Z87891 Personal history of nicotine dependence: Secondary | ICD-10-CM | POA: Diagnosis not present

## 2015-11-22 DIAGNOSIS — C50911 Malignant neoplasm of unspecified site of right female breast: Secondary | ICD-10-CM

## 2015-11-22 DIAGNOSIS — Z888 Allergy status to other drugs, medicaments and biological substances status: Secondary | ICD-10-CM | POA: Diagnosis not present

## 2015-11-22 DIAGNOSIS — Z79899 Other long term (current) drug therapy: Secondary | ICD-10-CM | POA: Insufficient documentation

## 2015-11-22 DIAGNOSIS — E119 Type 2 diabetes mellitus without complications: Secondary | ICD-10-CM | POA: Insufficient documentation

## 2015-11-22 DIAGNOSIS — Z808 Family history of malignant neoplasm of other organs or systems: Secondary | ICD-10-CM | POA: Diagnosis not present

## 2015-11-22 DIAGNOSIS — Z7982 Long term (current) use of aspirin: Secondary | ICD-10-CM | POA: Insufficient documentation

## 2015-11-22 DIAGNOSIS — I10 Essential (primary) hypertension: Secondary | ICD-10-CM | POA: Insufficient documentation

## 2015-11-22 DIAGNOSIS — Z801 Family history of malignant neoplasm of trachea, bronchus and lung: Secondary | ICD-10-CM | POA: Insufficient documentation

## 2015-11-22 DIAGNOSIS — Z7984 Long term (current) use of oral hypoglycemic drugs: Secondary | ICD-10-CM | POA: Diagnosis not present

## 2015-11-22 DIAGNOSIS — Z800 Family history of malignant neoplasm of digestive organs: Secondary | ICD-10-CM | POA: Insufficient documentation

## 2015-11-22 LAB — APTT: aPTT: 22 seconds — ABNORMAL LOW (ref 24–37)

## 2015-11-22 LAB — GLUCOSE, CAPILLARY
Glucose-Capillary: 145 mg/dL — ABNORMAL HIGH (ref 65–99)
Glucose-Capillary: 108 mg/dL — ABNORMAL HIGH (ref 65–99)

## 2015-11-22 LAB — BASIC METABOLIC PANEL
ANION GAP: 10 (ref 5–15)
BUN: 21 mg/dL — ABNORMAL HIGH (ref 6–20)
CALCIUM: 9.4 mg/dL (ref 8.9–10.3)
CO2: 19 mmol/L — AB (ref 22–32)
CREATININE: 1.23 mg/dL — AB (ref 0.44–1.00)
Chloride: 109 mmol/L (ref 101–111)
GFR calc Af Amer: 53 mL/min — ABNORMAL LOW (ref >60)
GFR, EST NON AFRICAN AMERICAN: 46 mL/min — AB (ref >60)
GLUCOSE: 146 mg/dL — AB (ref 65–99)
POTASSIUM: 3.6 mmol/L (ref 3.5–5.1)
Sodium: 138 mmol/L (ref 135–145)

## 2015-11-22 LAB — CBC
HCT: 35.5 % — ABNORMAL LOW (ref 36.0–46.0)
HEMOGLOBIN: 11.9 g/dL — AB (ref 12.0–15.0)
MCH: 28.6 pg (ref 26.0–34.0)
MCHC: 33.5 g/dL (ref 30.0–36.0)
MCV: 85.3 fL (ref 78.0–100.0)
PLATELETS: 191 10*3/uL (ref 150–400)
RBC: 4.16 MIL/uL (ref 3.87–5.11)
RDW: 12.6 % (ref 11.5–15.5)
WBC: 5.2 10*3/uL (ref 4.0–10.5)

## 2015-11-22 LAB — PROTIME-INR
INR: 1.01 (ref 0.00–1.49)
PROTHROMBIN TIME: 13.5 s (ref 11.6–15.2)

## 2015-11-22 MED ORDER — MIDAZOLAM HCL 2 MG/2ML IJ SOLN
INTRAMUSCULAR | Status: AC
  Filled 2015-11-22: qty 4

## 2015-11-22 MED ORDER — FENTANYL CITRATE (PF) 100 MCG/2ML IJ SOLN
INTRAMUSCULAR | Status: AC
  Filled 2015-11-22: qty 4

## 2015-11-22 MED ORDER — CEFAZOLIN SODIUM-DEXTROSE 2-4 GM/100ML-% IV SOLN
INTRAVENOUS | Status: AC
  Filled 2015-11-22: qty 100

## 2015-11-22 MED ORDER — MIDAZOLAM HCL 2 MG/2ML IJ SOLN
INTRAMUSCULAR | Status: AC | PRN
  Administered 2015-11-22: 1 mg via INTRAVENOUS
  Administered 2015-11-22: 0.5 mg via INTRAVENOUS

## 2015-11-22 MED ORDER — CEFAZOLIN SODIUM-DEXTROSE 2-4 GM/100ML-% IV SOLN
2.0000 g | INTRAVENOUS | Status: AC
  Administered 2015-11-22: 2 g via INTRAVENOUS

## 2015-11-22 MED ORDER — HEPARIN SOD (PORK) LOCK FLUSH 100 UNIT/ML IV SOLN
INTRAVENOUS | Status: AC
  Administered 2015-11-22: 500 [IU]
  Filled 2015-11-22: qty 5

## 2015-11-22 MED ORDER — LIDOCAINE HCL 1 % IJ SOLN
INTRAMUSCULAR | Status: AC
  Administered 2015-11-22: 20 mL
  Filled 2015-11-22: qty 20

## 2015-11-22 MED ORDER — FENTANYL CITRATE (PF) 100 MCG/2ML IJ SOLN
INTRAMUSCULAR | Status: AC | PRN
  Administered 2015-11-22: 25 ug via INTRAVENOUS
  Administered 2015-11-22: 50 ug via INTRAVENOUS

## 2015-11-22 MED ORDER — SODIUM CHLORIDE 0.9 % IV SOLN
INTRAVENOUS | Status: DC
  Administered 2015-11-22: 09:00:00 via INTRAVENOUS

## 2015-11-22 NOTE — Sedation Documentation (Signed)
Patient is resting comfortably. 

## 2015-11-22 NOTE — Sedation Documentation (Signed)
MD at bedside.  Dr Jacqualyn Posey in to see pt, questions answered.

## 2015-11-22 NOTE — Sedation Documentation (Signed)
O2 d/c'd 

## 2015-11-22 NOTE — Sedation Documentation (Signed)
Starting Co2 18-22

## 2015-11-22 NOTE — Sedation Documentation (Signed)
O2 2l/[place] started 

## 2015-11-22 NOTE — Progress Notes (Signed)
D/C instructions discussed and reviewed pt verbalized understanding.  D/C home via wheelchair

## 2015-11-22 NOTE — Sedation Documentation (Signed)
Moved to IR 1

## 2015-11-22 NOTE — Discharge Instructions (Signed)
Implanted [facility] Guide An implanted port is a type of central line that is placed under the skin. Central lines are used to provide IV access when treatment or nutrition needs to be given through a person's veins. Implanted ports are used for long-term IV access. An implanted port may be placed because:   You need IV medicine that would be irritating to the small veins in your hands or arms.   You need long-term IV medicines, such as antibiotics.   You need IV nutrition for a long period.   You need frequent blood draws for lab tests.   You need dialysis.  Implanted ports are usually placed in the chest area, but they can also be placed in the upper arm, the abdomen, or the leg. An implanted port has two main parts:   Reservoir. The reservoir is round and will appear as a small, raised area under your skin. The reservoir is the part where a needle is inserted to give medicines or draw blood.   Catheter. The catheter is a thin, flexible tube that extends from the reservoir. The catheter is placed into a large vein. Medicine that is inserted into the reservoir goes into the catheter and then into the vein.  HOW WILL I CARE FOR MY INCISION SITE? Do not get the incision site wet.  HOW IS MY PORT ACCESSED? Special steps must be taken to access the port:   Before the port is accessed, a numbing cream can be placed on the skin. This helps numb the skin over the port site.   Your health care provider uses a sterile technique to access the port.  Your health care provider must put on a mask and sterile gloves.  The skin over your port is cleaned carefully with an antiseptic and allowed to dry.  The port is gently pinched between sterile gloves, and a needle is inserted into the port.  Only "non-coring" port needles should be used to access the port. Once the port is accessed, a blood return should be checked. This helps ensure that the port is in the vein and is not clogged.   If  your port needs to remain accessed for a constant infusion, a clear (transparent) bandage will be placed over the needle site. The bandage and needle will need to be changed every week, or as directed by your health care provider.   Keep the bandage covering the needle clean and dry. Do not get it wet. Follow your health care provider's instructions on how to take a shower or bath while the port is accessed.   If your port does not need to stay accessed, no bandage is needed over the port.  WHAT IS FLUSHING? Flushing helps keep the port from getting clogged. Follow your health care provider's instructions on how and when to flush the port. Ports are usually flushed with saline solution or a medicine called heparin. The need for flushing will depend on how the port is used.   If the port is used for intermittent medicines or blood draws, the port will need to be flushed:   After medicines have been given.   After blood has been drawn.   As part of routine maintenance.   If a constant infusion is running, the port may not need to be flushed.  HOW LONG WILL MY PORT STAY IMPLANTED? The port can stay in for as long as your health care provider thinks it is needed. When it is time for  the port to come out, surgery will be done to remove it. The procedure is similar to the one performed when the port was put in.  WHEN SHOULD I SEEK IMMEDIATE MEDICAL CARE? When you have an implanted port, you should seek immediate medical care if:   You notice a bad smell coming from the incision site.   You have swelling, redness, or drainage at the incision site.   You have more swelling or pain at the port site or the surrounding area.   You have a fever that is not controlled with medicine.   This information is not intended to replace advice given to you by your health care provider. Make sure you discuss any questions you have with your health care provider.   Document Released: 04/29/2005  Document Revised: 02/17/2013 Document Reviewed: 01/04/2013 Elsevier Interactive Patient Education [employer].

## 2015-11-22 NOTE — Procedures (Signed)
Interventional Radiology Procedure Note  Procedure: Placement of a right IJ approach single lumen PowerPort.  Tip is positioned at the superior cavoatrial junction and catheter is ready for immediate use.  Complications: None Recommendations:  - Ok to shower tomorrow - Do not submerge for 7 days - Routine line care   Signed,  Halei Hanover S. Fountain Derusha, DO   

## 2015-11-22 NOTE — H&P (Signed)
Chief Complaint: Patient was seen in consultation today for port a cath placement at the request of Cornett,Thomas  Referring Physician(s): Cornett,Thomas  Supervising Physician: Corrie Mckusick  Patient Status: Outpatient  History of Present Illness: Gina Navarro is a 65 y.o. 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 Arthritis   . Menopausal state   . Hypertension 2006  . Diabetes mellitus without complication ([place]) 123456    oral meds until Victoza in 07/2012, now change to Invokana  . Breast cancer metastasized to axillary lymph node ([place]) 11/02/2015  . Skin cancer     Past Surgical History  Procedure Laterality Date  . Tonsillectomy and adenoidectomy    . Mohs surgery  2002    chin x 1 and right forehead X 1 in 2004    Allergies: Bee venom; Statins; and Victoza  Medications: Prior to Admission medications   Medication Sig Start Date End Date Taking? Authorizing Provider  aspirin 81 MG tablet Take 81 mg by mouth daily.   Yes Historical Provider, MD  canagliflozin (INVOKANA) 300 MG TABS tablet Take 1 tablet (300 mg total) by mouth daily before breakfast. 08/15/15  Yes Chelle Jeffery, PA-C  cholecalciferol (VITAMIN D) 1000 UNITS tablet Take 1,000 Units by mouth daily.   Yes Historical Provider, MD  glucosamine-chondroitin 500-400 MG tablet Take 1 tablet by mouth 3 (three) times daily.   Yes Historical Provider, MD  lisinopril-hydrochlorothiazide (PRINZIDE,ZESTORETIC) 20-25 MG tablet TAKE 1 TABLET BY MOUTH DAILY 08/15/15  Yes Chelle Jeffery, PA-C  metFORMIN (GLUCOPHAGE) 1000 MG tablet Take 1 tablet (1,000 mg total)  By mouth 2 (two) times daily with a meal 08/15/15  Yes Chelle Jeffery, PA-C  nitroGLYCERIN (NITROSTAT) 0.4 MG SL tablet Place 1 tablet (0.4 mg total) under the tongue every 5 (five) minutes as needed for chest pain. 02/16/13   Yes Chelle Jeffery, PA-C  Turmeric Curcumin 500 MG CAPS Take 1 capsule by mouth 2 (two) times daily.    Historical Provider, MD     Family History  Problem Relation Age of Onset  . Adopted: Yes  . Liver cancer Mother   . Lung cancer Mother   . Brain cancer Father     Social History   Social History  . Marital Status: Single    Spouse Name: n/a  . Number of Children: 0  . Years of Education: 17   Occupational History  . Psychologist (PhD)    Social History Main Topics  . Smoking status: Former Smoker -- 1.00 packs/day for 5 years    Types: Cigarettes  . Smokeless tobacco: Never Used  . Alcohol Use: No  . Drug Use: No  . Sexual Activity:    Partners: Female    Patent examiner Protection: Post-menopausal   Other Topics Concern  . None   Social History Narrative   Adoptive mother died in 2012/02/25. Lives alone.    Review of Systems: A 12 point ROS discussed and pertinent positives are indicated in the HPI above.  All other systems are negative.  Review of Systems  Constitutional: Negative for fever, activity change, appetite change and fatigue.  Respiratory: Negative for cough and shortness of breath.   Gastrointestinal: Negative for abdominal pain.  Psychiatric/Behavioral: Negative for behavioral problems and confusion.    Vital Signs: BP 145/72 mmHg  Pulse 97  Temp(Src) 98.5  F (36.9 C) (Oral)  Resp 18  Ht 5' 3.5" (1.613 m)  Wt 173 lb (78.472 kg)  BMI 30.16 kg/m2  SpO2 99%  LMP 05/13/1996  Physical Exam  Constitutional: She is oriented to person, place, and time. She appears well-nourished.  Cardiovascular: Normal rate, regular rhythm and normal heart sounds.   No murmur heard. Pulmonary/Chest: Effort normal and breath sounds normal. She has no wheezes.  Abdominal: Soft. Bowel sounds are normal. There is no tenderness.  Musculoskeletal: Normal range of motion.  Neurological: She is alert and oriented to person, place, and time.  Skin: Skin is warm and  dry.  Psychiatric: She has a normal mood and affect. Her behavior is normal. Judgment and thought content normal.  Nursing note and vitals reviewed.   Mallampati Score:  MD Evaluation Airway: WNL Heart: WNL Abdomen: WNL Chest/ Lungs: WNL ASA  Classification: 2, 3 Mallampati/Airway Score: One  Imaging: Ct Chest W Contrast  11/17/2015  CLINICAL DATA:  New diagnosis of right breast cancer. EXAM: CT CHEST, ABDOMEN, AND PELVIS WITH CONTRAST TECHNIQUE: Multidetector CT imaging of the chest, abdomen and pelvis was performed following the standard protocol during bolus administration of intravenous contrast. CONTRAST:  25mL ISOVUE-300 IOPAMIDOL (ISOVUE-300) INJECTION 61% COMPARISON:  None. FINDINGS: CT CHEST FINDINGS Mediastinum/Lymph Nodes: The heart size appears normal. Aortic atherosclerosis is identified. Calcification in the RCA and LAD coronary artery is identified. The trachea appears patent and is midline. Normal appearance of the esophagus. Multiple enlarged right axillary lymph nodes are identified. Index right axillary lymph node measures 2.4 cm, image 99 of series 2. Right retro pectoral node is enlarged measuring 1.4 cm, image 17 of series 2. No no enlarged supraclavicular adenopathy. No enlarged left axillary nodes. No enlarged internal mammary lymph nodes. Lungs/Pleura: No pleural fluid. There is no airspace consolidation or atelectasis identified. There is a small nodule within the superior segment of the left lower lobe measuring 4 mm, nonspecific, image number 63 of series 5. Musculoskeletal: No chest wall mass or suspicious bone lesions identified. CT ABDOMEN PELVIS FINDINGS Hepatobiliary: No suspicious liver abnormalities identified. Gallstones are identified within the dependent portion of the gallbladder. No biliary dilatation. There is no biliary dilatation. Pancreas: Normal appearance of the pancreas. Spleen: Normal appearance of the spleen. Adrenals/Urinary Tract: The adrenal glands  are within normal limits. No kidney abnormality identified. The urinary bladder appears normal. Stomach/Bowel: The stomach is within normal limits. The small bowel loops have a normal course and caliber. No obstruction. Normal appearance of the colon. Vascular/Lymphatic: Calcified atherosclerotic disease involves the abdominal aorta. No aneurysm. No enlarged retroperitoneal or mesenteric adenopathy. No enlarged pelvic or inguinal lymph nodes. Reproductive: No mass or other significant abnormality. Other: There is no ascites or focal fluid collections within the abdomen or pelvis. Musculoskeletal: No aggressive lytic or sclerotic bone lesions. There is degenerative disc disease noted within the lumbar spine. IMPRESSION: 1. Enlarged right axillary and retropectoral lymph nodes compatible with metastatic adenopathy. 2. No evidence for distant metastatic disease from patient's right breast cancer. 3. Aortic atherosclerosis and multi vessel coronary artery calcification 4. Small pulmonary nodule in the superior segment of left lower lobe measures 4 mm. 5. Gallstones. Electronically Signed   By: Kerby Moors M.D.   On: 11/17/2015 10:42   Nm Bone Scan Whole Body  11/17/2015  CLINICAL DATA:  Breast cancer. EXAM: NUCLEAR MEDICINE WHOLE BODY BONE SCAN TECHNIQUE: Whole body anterior and posterior images were obtained approximately 3 hours after intravenous injection of radiopharmaceutical. RADIOPHARMACEUTICALS:  26.9 MCi Technetium-25m MDP IV COMPARISON:  CT 11/17/2015. FINDINGS: Bilateral renal function and excretion. Minimal increased activity at the shoulders, sternoclavicular joints, hands most likely degenerative. No evidence of metastatic disease. IMPRESSION: No evidence of metastatic disease. Electronically Signed   By: [name]  Register   On: 11/17/2015 13:06   Ct Abdomen Pelvis W Contrast  11/17/2015  CLINICAL DATA:  New diagnosis of right breast cancer. EXAM: CT CHEST, ABDOMEN, AND PELVIS WITH CONTRAST  TECHNIQUE: Multidetector CT imaging of the chest, abdomen and pelvis was performed following the standard protocol during bolus administration of intravenous contrast. CONTRAST:  59mL ISOVUE-300 IOPAMIDOL (ISOVUE-300) INJECTION 61% COMPARISON:  None. FINDINGS: CT CHEST FINDINGS Mediastinum/Lymph Nodes: The heart size appears normal. Aortic atherosclerosis is identified. Calcification in the RCA and LAD coronary artery is identified. The trachea appears patent and is midline. Normal appearance of the esophagus. Multiple enlarged right axillary lymph nodes are identified. Index right axillary lymph node measures 2.4 cm, image 99 of series 2. Right retro pectoral node is enlarged measuring 1.4 cm, image 17 of series 2. No no enlarged supraclavicular adenopathy. No enlarged left axillary nodes. No enlarged internal mammary lymph nodes. Lungs/Pleura: No pleural fluid. There is no airspace consolidation or atelectasis identified. There is a small nodule within the superior segment of the left lower lobe measuring 4 mm, nonspecific, image number 63 of series 5. Musculoskeletal: No chest wall mass or suspicious bone lesions identified. CT ABDOMEN PELVIS FINDINGS Hepatobiliary: No suspicious liver abnormalities identified. Gallstones are identified within the dependent portion of the gallbladder. No biliary dilatation. There is no biliary dilatation. Pancreas: Normal appearance of the pancreas. Spleen: Normal appearance of the spleen. Adrenals/Urinary Tract: The adrenal glands are within normal limits. No kidney abnormality identified. The urinary bladder appears normal. Stomach/Bowel: The stomach is within normal limits. The small bowel loops have a normal course and caliber. No obstruction. Normal appearance of the colon. Vascular/Lymphatic: Calcified atherosclerotic disease involves the abdominal aorta. No aneurysm. No enlarged retroperitoneal or mesenteric adenopathy. No enlarged pelvic or inguinal lymph nodes.  Reproductive: No mass or other significant abnormality. Other: There is no ascites or focal fluid collections within the abdomen or pelvis. Musculoskeletal: No aggressive lytic or sclerotic bone lesions. There is degenerative disc disease noted within the lumbar spine. IMPRESSION: 1. Enlarged right axillary and retropectoral lymph nodes compatible with metastatic adenopathy. 2. No evidence for distant metastatic disease from patient's right breast cancer. 3. Aortic atherosclerosis and multi vessel coronary artery calcification 4. Small pulmonary nodule in the superior segment of left lower lobe measures 4 mm. 5. Gallstones. Electronically Signed   By: Kerby Moors M.D.   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 LABS:  GFR 38. EXAM: BILATERAL BREAST MRI WITH AND WITHOUT CONTRAST TECHNIQUE: Multiplanar, multisequence MR images of both breasts were obtained prior to and following the intravenous administration of 8 ml of MultiHance. THREE-DIMENSIONAL MR IMAGE RENDERING ON INDEPENDENT WORKSTATION: Three-dimensional MR images were rendered by post-processing of the original MR data on an independent workstation. The three-dimensional MR images were interpreted, and findings are reported in the following complete MRI report for this study. Three dimensional images were evaluated at the independent DynaCad workstation COMPARISON:  Mammograms 10/25/2015 and priors. FINDINGS: Breast composition: b. Scattered fibroglandular tissue. Background parenchymal enhancement: Moderate. Right breast: No mass or abnormal enhancement. Numerous 1-2 mm progressively enhancing foci are  seen throughout the breast, demonstrating enhancement below threshold for abnormal enhancement. Left breast: No mass or abnormal enhancement. Lymph nodes: There are 6 enlarged grossly abnormal lymph nodes in  the right axilla. Ancillary findings:  None. IMPRESSION: No MRI evidence of malignancy in either breast. Marked right axillary lymphadenopathy. No evidence of left axillary lymphadenopathy. RECOMMENDATION: Clinical follow-up for biopsy-proven malignant right axillary lymphadenopathy. BI-RADS CATEGORY  6: Known biopsy-proven malignancy. Electronically Signed   By: Fidela Salisbury M.D.   On: 11/07/2015 15:29    Labs:  CBC:  Recent Labs  11/08/15 1233  WBC 8.8  HGB 12.4  HCT 37.8  PLT 259    COAGS: No results for input(s): INR, APTT in the last 8760 hours.  BMP:  Recent Labs  08/15/15 1104 11/08/15 1233  NA 135 136  K 4.3 4.2  CL 106  --   CO2 15* 21*  GLUCOSE 131* 192*  BUN 33* 22.7  CALCIUM 9.6 10.0  CREATININE 1.34* 1.4*    LIVER FUNCTION TESTS:  Recent Labs  08/15/15 1104 11/08/15 1233  BILITOT 0.5 0.55  AST 22 27  ALT 31* 34  ALKPHOS 55 54  PROT 7.5 8.0  ALBUMIN 4.6 4.4    TUMOR MARKERS: No results for input(s): AFPTM, CEA, CA199, CHROMGRNA in the last 8760 hours.  Assessment and Plan:  New Dx breast cancer- mets to Rt Ax LN To start chemo 11/24/2015 Scheduled for PAC placement today Risks and Benefits discussed with the patient including, but not limited to bleeding, infection, pneumothorax, or fibrin sheath development and need for additional procedures. All of the patient's questions were answered, patient is agreeable to proceed. Consent signed and in chart.  Thank you for this interesting consult.  I greatly enjoyed meeting Gina Navarro and look forward to participating in their care.  A copy of this report was sent to the requesting provider on this date.  Electronically Signed: Auset Fritzler A 11/22/2015, 8:54 AM   I spent a total of  30 Minutes   in face to face in clinical consultation, greater than 50% of which was counseling/coordinating care for [facility] placement

## 2015-11-23 ENCOUNTER — Telehealth: Payer: Self-pay | Admitting: *Deleted

## 2015-11-23 ENCOUNTER — Other Ambulatory Visit: Payer: Self-pay | Admitting: *Deleted

## 2015-11-23 ENCOUNTER — Ambulatory Visit (HOSPITAL_BASED_OUTPATIENT_CLINIC_OR_DEPARTMENT_OTHER): Payer: BLUE CROSS/BLUE SHIELD | Admitting: Oncology

## 2015-11-23 VITALS — BP 119/56 | HR 105 | Temp 97.7°F | Resp 18 | Ht 63.5 in | Wt 175.2 lb

## 2015-11-23 DIAGNOSIS — Z170 Estrogen receptor positive status [ER+]: Secondary | ICD-10-CM | POA: Diagnosis not present

## 2015-11-23 DIAGNOSIS — C50611 Malignant neoplasm of axillary tail of right female breast: Secondary | ICD-10-CM

## 2015-11-23 DIAGNOSIS — C50911 Malignant neoplasm of unspecified site of right female breast: Secondary | ICD-10-CM

## 2015-11-23 DIAGNOSIS — C773 Secondary and unspecified malignant neoplasm of axilla and upper limb lymph nodes: Principal | ICD-10-CM

## 2015-11-23 MED ORDER — LIDOCAINE-PRILOCAINE 2.5-2.5 % EX CREA: TOPICAL_CREAM | CUTANEOUS | Status: AC

## 2015-11-23 MED ORDER — LORAZEPAM 0.5 MG PO TABS: 0.5000 mg | ORAL_TABLET | Freq: Every evening | ORAL | Status: DC | PRN

## 2015-11-23 MED ORDER — DEXAMETHASONE 4 MG PO TABS: ORAL_TABLET | ORAL | Status: DC

## 2015-11-23 MED ORDER — PROCHLORPERAZINE MALEATE 10 MG PO TABS: 10.0000 mg | ORAL_TABLET | Freq: Four times a day (QID) | ORAL | Status: DC | PRN

## 2015-11-23 NOTE — Progress Notes (Signed)
[place]  Telephone:(336) [phone] Fax:(336) [phone]     ID: Gina Navarro DOB: Jul 01, 1950  MR#: [id number]  KVQ#:[id number]  Patient Care Team: Harrison Mons, PA-C as PCP - General (Physician Assistant) Jari Pigg, MD as Attending Physician (Dermatology) Kem Boroughs, FNP as Nurse Practitioner (Obstetrics and Gynecology) Adrian Prows, MD as Consulting Physician (Cardiology) Erroll Luna, MD as Consulting Physician (General Surgery) Chauncey Cruel, MD as Consulting Physician (Oncology) Eppie Gibson, MD as Attending Physician (Radiation Oncology) PCP: Harrison Mons, PA-C Chauncey Cruel, MD OTHER MD:  CHIEF COMPLAINT: Estrogen receptor positive breast cancer  CURRENT TREATMENT: Neoadjuvant chemotherapy   BREAST CANCER HISTORY: From the original intake note:  "Gina Navarro" noted a mass in her right axilla 10/20/2015 and brought it to medical attention. She was scheduled for bilateral diagnostic mammography with tomography at [facility] 10/24/2015. The breast composition was category B. Mammogram of the breast showed no suspicious masses. However there was palpable right axillary adenopathy, more specifically a 5 cm "lump beneath the anterior right axilla, and ultrasound was obtained.. On ultrasound, the breast itself was unremarkable. However there were multiple enlarged right axillary lymph nodes the largest measuring 5.7 cm.   Biopsy of one of the enlarged right axillary lymph nodes on 10/25/2015 showed metastatic carcinoma, which was cytokeratin 7 positive, estrogen receptor 95% positive, progesterone receptor 10% positive, both with strong staining intensity, and HER-2 nonamplified, with a signals ratio 1.62, and the number percell 2.10.  MRI of the breasts 11/06/2015 again showed no mass or abnormal enhancement in the right breast or left breast. In the right axilla there were 6 enlarged grossly abnormal lymph nodes.  The patient's subsequent history is as detailed  below  INTERVAL HISTORY: Gina Navarro returns today for a discussion of her chemotherapy tomorrow. In preparation for that she had an echocardiogram 11/17/2015 which showed an excellent ejection fraction in the 60-65% range. She had a right-sided port placed by radiology 11/22/2015. She had staging CT scans of the chest abdomen and pelvis 11/17/2015 which showed enlarged right axillary and retropectoral lymph nodes but no evidence of distant metastatic disease. There was aortic atherosclerosis. There was a 4 mm pulmonary nodule in the left lower lobe which will require follow-up. She had a bone scan the same day showing no evidence of metastatic disease. Finally she came to chemotherapy school to review the possible toxicities from her upcoming treatments. She also had a tumor of the treatment area.  REVIEW OF SYSTEMS: She has moderate tenderness and pain associated with the recent port placement. She is a little short of breath when climbing stairs but gets to the top without difficulty. She has joint pains here and there which are not more intense or persistent than before. A detailed review of systems today was otherwise stable.  PAST MEDICAL HISTORY: Past Medical History  Diagnosis Date  . Arthritis   . Menopausal state   . Hypertension 2006  . Diabetes mellitus without complication ([place]) 6433    oral meds until Victoza in 07/2012, now change to Invokana  . Breast cancer metastasized to axillary lymph node ([place]) 11/02/2015  . Skin cancer     PAST SURGICAL HISTORY: Past Surgical History  Procedure Laterality Date  . Tonsillectomy and adenoidectomy    . Mohs surgery  2002    chin x 1 and right forehead X 1 in 2004    FAMILY HISTORY Family History  Problem Relation Age of Onset  . Adopted: Yes  . Liver cancer Mother   .  Lung cancer Mother   . Brain cancer Father   The patient is adopted and knows little about her biological family. Both are adopted parents have died. Her adopted Brother  (not biologically related) Lowry Ram, lives in [place].  GYNECOLOGIC HISTORY:  Patient's last menstrual period was 05/13/1996. Menarche age 79, the patient is GX P0. She went through menopause in her early 63s and did not take hormone replacement. She never used oral contraceptives.  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 She is contracted by group who contracts to the state. She is single and lives alone with 4 dogs. She has a large fasting yard. She attends [employer]    ADVANCED DIRECTIVES: Not in place   HEALTH MAINTENANCE: Social History  Substance Use Topics  . Smoking status: Former Smoker -- 1.00 packs/day for 5 years    Types: Cigarettes  . Smokeless tobacco: Never Used  . Alcohol Use: No     Colonoscopy: Never  PAP:  Bone density: Never   Allergies  Allergen Reactions  . Bee Venom   . Statins Other (See Comments)    MYALGIAS with Crestor, Zocor. Tolerating Lipitor (05/2013)  . Victoza [Liraglutide]     Dizziness, peripheral neuropathy, constipation, heart palpitations    Current Outpatient Prescriptions  Medication Sig Dispense Refill  . aspirin 81 MG tablet Take 81 mg by mouth daily.    . canagliflozin (INVOKANA) 300 MG TABS tablet Take 1 tablet (300 mg total) by mouth daily before breakfast. 90 tablet 3  . cholecalciferol (VITAMIN D) 1000 UNITS tablet Take 1,000 Units by mouth daily.    Marland Kitchen dexamethasone (DECADRON) 4 MG tablet Take 2 tablets by mouth once a day on the day after chemotherapy and then take 2 tablets two times a day for 2 days. Take with food. 30 tablet 1  . glucosamine-chondroitin 500-400 MG tablet Take 1 tablet by mouth 3 (three) times daily.    Marland Kitchen lidocaine-prilocaine (EMLA) cream Apply to port 1-2 hours before procedure 30 g 1  . lisinopril-hydrochlorothiazide  (PRINZIDE,ZESTORETIC) 20-25 MG tablet TAKE 1 TABLET BY MOUTH DAILY 90 tablet 3  . LORazepam (ATIVAN) 0.5 MG tablet Take 1 tablet (0.5 mg total) by mouth at bedtime as needed (Nausea or vomiting). 30 tablet 0  . metFORMIN (GLUCOPHAGE) 1000 MG tablet Take 1 tablet (1,000 mg total)  By mouth 2 (two) times daily with a meal 180 tablet 3  . nitroGLYCERIN (NITROSTAT) 0.4 MG SL tablet Place 1 tablet (0.4 mg total) under the tongue every 5 (five) minutes as needed for chest pain. 25 tablet 1  . prochlorperazine (COMPAZINE) 10 MG tablet Take 1 tablet (10 mg total) by mouth every 6 (six) hours as needed (Nausea or vomiting). 30 tablet 1  . Turmeric Curcumin 500 MG CAPS Take 1 capsule by mouth 2 (two) times daily.     No current facility-administered medications for this visit.    OBJECTIVE: Middle-aged white woman Who appears stated age  65 Vitals:   11/23/15 1221  BP: 119/56  Pulse: 105  Temp: 97.7 F (36.5 C)  Resp: 18     Body mass index is 30.54 kg/(m^2).    ECOG FS:0 - Asymptomatic  Sclerae unicteric, pupils round and equal Oropharynx clear and moist-- no thrush or other lesions No cervical or supraclavicular adenopathy Lungs no rales or rhonchi Heart regular  rate and rhythm Abd soft, nontender, positive bowel sounds MSK no focal spinal tenderness, no upper extremity lymphedema Neuro: nonfocal, well oriented, appropriate affect Breasts: Deferred Skin: port site intact w/o erythema or swelling  LAB RESULTS:  CMP     Component Value Date/Time   NA 138 11/22/2015 0845   NA 136 11/08/2015 1233   K 3.6 11/22/2015 0845   K 4.2 11/08/2015 1233   CL 109 11/22/2015 0845   CO2 19* 11/22/2015 0845   CO2 21* 11/08/2015 1233   GLUCOSE 146* 11/22/2015 0845   GLUCOSE 192* 11/08/2015 1233   BUN 21* 11/22/2015 0845   BUN 22.7 11/08/2015 1233   CREATININE 1.23* 11/22/2015 0845   CREATININE 1.4* 11/08/2015 1233   CREATININE 1.34* 08/15/2015 1104   CALCIUM 9.4 11/22/2015 0845   CALCIUM  10.0 11/08/2015 1233   PROT 8.0 11/08/2015 1233   PROT 7.5 08/15/2015 1104   ALBUMIN 4.4 11/08/2015 1233   ALBUMIN 4.6 08/15/2015 1104   AST 27 11/08/2015 1233   AST 22 08/15/2015 1104   ALT 34 11/08/2015 1233   ALT 31* 08/15/2015 1104   ALKPHOS 54 11/08/2015 1233   ALKPHOS 55 08/15/2015 1104   BILITOT 0.55 11/08/2015 1233   BILITOT 0.5 08/15/2015 1104   GFRNONAA 46* 11/22/2015 0845   GFRNONAA 67 08/31/2013 1007   GFRAA 53* 11/22/2015 0845   GFRAA 77 08/31/2013 1007    INo results found for: SPEP, UPEP  Lab Results  Component Value Date   WBC 5.2 11/22/2015   NEUTROABS 6.8* 11/08/2015   HGB 11.9* 11/22/2015   HCT 35.5* 11/22/2015   MCV 85.3 11/22/2015   PLT 191 11/22/2015      Chemistry      Component Value Date/Time   NA 138 11/22/2015 0845   NA 136 11/08/2015 1233   K 3.6 11/22/2015 0845   K 4.2 11/08/2015 1233   CL 109 11/22/2015 0845   CO2 19* 11/22/2015 0845   CO2 21* 11/08/2015 1233   BUN 21* 11/22/2015 0845   BUN 22.7 11/08/2015 1233   CREATININE 1.23* 11/22/2015 0845   CREATININE 1.4* 11/08/2015 1233   CREATININE 1.34* 08/15/2015 1104      Component Value Date/Time   CALCIUM 9.4 11/22/2015 0845   CALCIUM 10.0 11/08/2015 1233   ALKPHOS 54 11/08/2015 1233   ALKPHOS 55 08/15/2015 1104   AST 27 11/08/2015 1233   AST 22 08/15/2015 1104   ALT 34 11/08/2015 1233   ALT 31* 08/15/2015 1104   BILITOT 0.55 11/08/2015 1233   BILITOT 0.5 08/15/2015 1104       No results found for: LABCA2  No components found for: LABCA125   Recent Labs Lab 11/22/15 0845  INR 1.01    Urinalysis    Component Value Date/Time   BILIRUBINUR n 12/13/2014 1000   PROTEINUR n 12/13/2014 1000   UROBILINOGEN negative 12/13/2014 1000   NITRITE n 12/13/2014 1000   LEUKOCYTESUR Negative 11/05/2011 1201     STUDIES: Ct Chest W Contrast  11/17/2015  CLINICAL DATA:  New diagnosis of right breast cancer. EXAM: CT CHEST, ABDOMEN, AND PELVIS WITH CONTRAST TECHNIQUE:  Multidetector CT imaging of the chest, abdomen and pelvis was performed following the standard protocol during bolus administration of intravenous contrast. CONTRAST:  62m ISOVUE-300 IOPAMIDOL (ISOVUE-300) INJECTION 61% COMPARISON:  None. FINDINGS: CT CHEST FINDINGS Mediastinum/Lymph Nodes: The heart size appears normal. Aortic atherosclerosis is identified. Calcification in the RCA and LAD coronary artery is identified. The trachea appears patent and is  midline. Normal appearance of the esophagus. Multiple enlarged right axillary lymph nodes are identified. Index right axillary lymph node measures 2.4 cm, image 99 of series 2. Right retro pectoral node is enlarged measuring 1.4 cm, image 17 of series 2. No no enlarged supraclavicular adenopathy. No enlarged left axillary nodes. No enlarged internal mammary lymph nodes. Lungs/Pleura: No pleural fluid. There is no airspace consolidation or atelectasis identified. There is a small nodule within the superior segment of the left lower lobe measuring 4 mm, nonspecific, image number 63 of series 5. Musculoskeletal: No chest wall mass or suspicious bone lesions identified. CT ABDOMEN PELVIS FINDINGS Hepatobiliary: No suspicious liver abnormalities identified. Gallstones are identified within the dependent portion of the gallbladder. No biliary dilatation. There is no biliary dilatation. Pancreas: Normal appearance of the pancreas. Spleen: Normal appearance of the spleen. Adrenals/Urinary Tract: The adrenal glands are within normal limits. No kidney abnormality identified. The urinary bladder appears normal. Stomach/Bowel: The stomach is within normal limits. The small bowel loops have a normal course and caliber. No obstruction. Normal appearance of the colon. Vascular/Lymphatic: Calcified atherosclerotic disease involves the abdominal aorta. No aneurysm. No enlarged retroperitoneal or mesenteric adenopathy. No enlarged pelvic or inguinal lymph nodes. Reproductive: No  mass or other significant abnormality. Other: There is no ascites or focal fluid collections within the abdomen or pelvis. Musculoskeletal: No aggressive lytic or sclerotic bone lesions. There is degenerative disc disease noted within the lumbar spine. IMPRESSION: 1. Enlarged right axillary and retropectoral lymph nodes compatible with metastatic adenopathy. 2. No evidence for distant metastatic disease from patient's right breast cancer. 3. Aortic atherosclerosis and multi vessel coronary artery calcification 4. Small pulmonary nodule in the superior segment of left lower lobe measures 4 mm. 5. Gallstones. Electronically Signed   By: Kerby Moors M.D.   On: 11/17/2015 10:42   Nm Bone Scan Whole Body  11/17/2015  CLINICAL DATA:  Breast cancer. EXAM: NUCLEAR MEDICINE WHOLE BODY BONE SCAN TECHNIQUE: Whole body anterior and posterior images were obtained approximately 3 hours after intravenous injection of radiopharmaceutical. RADIOPHARMACEUTICALS:  26.9 MCi Technetium-19mMDP IV COMPARISON:  CT 11/17/2015. FINDINGS: Bilateral renal function and excretion. Minimal increased activity at the shoulders, sternoclavicular joints, hands most likely degenerative. No evidence of metastatic disease. IMPRESSION: No evidence of metastatic disease. Electronically Signed   By: TMarcello Moores Register   On: 11/17/2015 13:06   Ct Abdomen Pelvis W Contrast  11/17/2015  CLINICAL DATA:  New diagnosis of right breast cancer. EXAM: CT CHEST, ABDOMEN, AND PELVIS WITH CONTRAST TECHNIQUE: Multidetector CT imaging of the chest, abdomen and pelvis was performed following the standard protocol during bolus administration of intravenous contrast. CONTRAST:  871mISOVUE-300 IOPAMIDOL (ISOVUE-300) INJECTION 61% COMPARISON:  None. FINDINGS: CT CHEST FINDINGS Mediastinum/Lymph Nodes: The heart size appears normal. Aortic atherosclerosis is identified. Calcification in the RCA and LAD coronary artery is identified. The trachea appears patent and is  midline. Normal appearance of the esophagus. Multiple enlarged right axillary lymph nodes are identified. Index right axillary lymph node measures 2.4 cm, image 99 of series 2. Right retro pectoral node is enlarged measuring 1.4 cm, image 17 of series 2. No no enlarged supraclavicular adenopathy. No enlarged left axillary nodes. No enlarged internal mammary lymph nodes. Lungs/Pleura: No pleural fluid. There is no airspace consolidation or atelectasis identified. There is a small nodule within the superior segment of the left lower lobe measuring 4 mm, nonspecific, image number 63 of series 5. Musculoskeletal: No chest wall mass or suspicious bone lesions identified.  CT ABDOMEN PELVIS FINDINGS Hepatobiliary: No suspicious liver abnormalities identified. Gallstones are identified within the dependent portion of the gallbladder. No biliary dilatation. There is no biliary dilatation. Pancreas: Normal appearance of the pancreas. Spleen: Normal appearance of the spleen. Adrenals/Urinary Tract: The adrenal glands are within normal limits. No kidney abnormality identified. The urinary bladder appears normal. Stomach/Bowel: The stomach is within normal limits. The small bowel loops have a normal course and caliber. No obstruction. Normal appearance of the colon. Vascular/Lymphatic: Calcified atherosclerotic disease involves the abdominal aorta. No aneurysm. No enlarged retroperitoneal or mesenteric adenopathy. No enlarged pelvic or inguinal lymph nodes. Reproductive: No mass or other significant abnormality. Other: There is no ascites or focal fluid collections within the abdomen or pelvis. Musculoskeletal: No aggressive lytic or sclerotic bone lesions. There is degenerative disc disease noted within the lumbar spine. IMPRESSION: 1. Enlarged right axillary and retropectoral lymph nodes compatible with metastatic adenopathy. 2. No evidence for distant metastatic disease from patient's right breast cancer. 3. Aortic  atherosclerosis and multi vessel coronary artery calcification 4. Small pulmonary nodule in the superior segment of left lower lobe measures 4 mm. 5. Gallstones. Electronically Signed   By: Kerby Moors M.D.   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 LABS:  GFR 38. EXAM: BILATERAL BREAST MRI WITH AND WITHOUT CONTRAST TECHNIQUE: Multiplanar, multisequence MR images of both breasts were obtained prior to and following the intravenous administration of 8 ml of MultiHance. THREE-DIMENSIONAL MR IMAGE RENDERING ON INDEPENDENT WORKSTATION: Three-dimensional MR images were rendered by post-processing of the original MR data on an independent workstation. The three-dimensional MR images were interpreted, and findings are reported in the following complete MRI report for this study. Three dimensional images were evaluated at the independent DynaCad workstation COMPARISON:  Mammograms 10/25/2015 and priors. FINDINGS: Breast composition: b. Scattered fibroglandular tissue. Background parenchymal enhancement: Moderate. Right breast: No mass or abnormal enhancement. Numerous 1-2 mm progressively enhancing foci are seen throughout the breast, demonstrating enhancement below threshold for abnormal enhancement. Left breast: No mass or abnormal enhancement. Lymph nodes: There are 6 enlarged grossly abnormal lymph nodes in the right axilla. Ancillary findings:  None. IMPRESSION: No MRI evidence of malignancy in either breast. Marked right axillary lymphadenopathy. No evidence of left axillary lymphadenopathy. RECOMMENDATION: Clinical follow-up for biopsy-proven malignant right axillary lymphadenopathy. BI-RADS CATEGORY  6: Known biopsy-proven malignancy. Electronically Signed   By: Fidela Salisbury M.D.   On: 11/07/2015 15:29   Ir Fluoro Guide Cv Line Left  11/22/2015  INDICATION:  65 year old female with a history of right-sided breast carcinoma. She tells me she will have chemotherapy for 3-4 months of cycling, then with surgery at with future radiation therapy. Given the interval time for healing, right-sided port is reasonable. EXAM: IMPLANTED PORT A CATH PLACEMENT WITH ULTRASOUND AND FLUOROSCOPIC GUIDANCE MEDICATIONS: 2.0 g Ancef; The antibiotic was administered within an appropriate time interval prior to skin puncture. ANESTHESIA/SEDATION: Moderate (conscious) sedation was employed during this procedure. A total of Versed 1.5 mg and Fentanyl 75 mcg was administered intravenously. Moderate Sedation Time: 19 minutes. The patient's level of consciousness and vital signs were monitored continuously by radiology nursing throughout the procedure under my direct supervision. FLUOROSCOPY TIME:  Zero minutes, 12 seconds (1 mGy) COMPLICATIONS: None PROCEDURE: The procedure, risks, benefits, and alternatives were explained to the patient. Questions regarding the procedure were encouraged and answered. The patient  understands and consents to the procedure. Ultrasound survey was performed with images stored and sent to PACs. The right neck and chest was prepped with chlorhexidine, and draped in the usual sterile fashion using maximum barrier technique (cap and mask, sterile gown, sterile gloves, large sterile sheet, hand hygiene and cutaneous antiseptic). Antibiotic prophylaxis was provided with 2.0g Ancef administered IV one hour prior to skin incision. Local anesthesia was attained by infiltration with 1% lidocaine without epinephrine. Ultrasound demonstrated patency of the right internal jugular vein, and this was documented with an image. Under real-time ultrasound guidance, this vein was accessed with a 21 gauge micropuncture needle and image documentation was performed. A small dermatotomy was made at the access site with an 11 scalpel. A 0.018" wire was advanced into the SVC and used to  estimate the length of the internal catheter. The access needle exchanged for a 62F micropuncture vascular sheath. The 0.018" wire was then removed and a 0.035" wire advanced into the IVC. An appropriate location for the subcutaneous reservoir was selected below the clavicle and an incision was made through the skin and underlying soft tissues. The subcutaneous tissues were then dissected using a combination of blunt and sharp surgical technique and a pocket was formed. A single lumen power injectable portacatheter was then tunneled through the subcutaneous tissues from the pocket to the dermatotomy and the port reservoir placed within the subcutaneous pocket. The venous access site was then serially dilated and a peel away vascular sheath placed over the wire. The wire was removed and the port catheter advanced into position under fluoroscopic guidance. The catheter tip is positioned in the cavoatrial junction. This was documented with a spot image. The portacatheter was then tested and found to flush and aspirate well. The port was flushed with saline followed by 100 units/mL heparinized saline. The pocket was then closed in two layers using first subdermal inverted interrupted absorbable sutures followed by a running subcuticular suture. The epidermis was then sealed with Dermabond. The dermatotomy at the venous access site was also seal with Dermabond. Patient tolerated the procedure well and remained hemodynamically stable throughout. No complications encountered and no significant blood loss encountered IMPRESSION: Status post right IJ port catheter placement. Catheter ready for use. Signed, Dulcy Fanny. Earleen Newport, DO [facility] Electronically Signed   By: Corrie Mckusick D.O.   On: 11/22/2015 16:17   Ir US Guide Vasc Access Right  11/22/2015  INDICATION: 65 year old female with a history of right-sided breast carcinoma. She tells me she will have  chemotherapy for 3-4 months of cycling, then with surgery at with future radiation therapy. Given the interval time for healing, right-sided port is reasonable. EXAM: IMPLANTED PORT A CATH PLACEMENT WITH ULTRASOUND AND FLUOROSCOPIC GUIDANCE MEDICATIONS: 2.0 g Ancef; The antibiotic was administered within an appropriate time interval prior to skin puncture. ANESTHESIA/SEDATION: Moderate (conscious) sedation was employed during this procedure. A total of Versed 1.5 mg and Fentanyl 75 mcg was administered intravenously. Moderate Sedation Time: 19 minutes. The patient's level of consciousness and vital signs were monitored continuously by radiology nursing throughout the procedure under my direct supervision. FLUOROSCOPY TIME:  Zero minutes, 12 seconds (1 mGy) COMPLICATIONS: None PROCEDURE: The procedure, risks, benefits, and alternatives were explained to the patient. Questions regarding the procedure were encouraged and answered. The patient understands and consents to the procedure. Ultrasound survey was performed with images stored and sent to PACs. The right neck and chest was prepped with chlorhexidine, and draped in  the usual sterile fashion using maximum barrier technique (cap and mask, sterile gown, sterile gloves, large sterile sheet, hand hygiene and cutaneous antiseptic). Antibiotic prophylaxis was provided with 2.0g Ancef administered IV one hour prior to skin incision. Local anesthesia was attained by infiltration with 1% lidocaine without epinephrine. Ultrasound demonstrated patency of the right internal jugular vein, and this was documented with an image. Under real-time ultrasound guidance, this vein was accessed with a 21 gauge micropuncture needle and image documentation was performed. A small dermatotomy was made at the access site with an 11 scalpel. A 0.018" wire was advanced into the SVC and used to estimate the length of the internal catheter. The access needle exchanged for a 79F micropuncture  vascular sheath. The 0.018" wire was then removed and a 0.035" wire advanced into the IVC. An appropriate location for the subcutaneous reservoir was selected below the clavicle and an incision was made through the skin and underlying soft tissues. The subcutaneous tissues were then dissected using a combination of blunt and sharp surgical technique and a pocket was formed. A single lumen power injectable portacatheter was then tunneled through the subcutaneous tissues from the pocket to the dermatotomy and the port reservoir placed within the subcutaneous pocket. The venous access site was then serially dilated and a peel away vascular sheath placed over the wire. The wire was removed and the port catheter advanced into position under fluoroscopic guidance. The catheter tip is positioned in the cavoatrial junction. This was documented with a spot image. The portacatheter was then tested and found to flush and aspirate well. The port was flushed with saline followed by 100 units/mL heparinized saline. The pocket was then closed in two layers using first subdermal inverted interrupted absorbable sutures followed by a running subcuticular suture. The epidermis was then sealed with Dermabond. The dermatotomy at the venous access site was also seal with Dermabond. Patient tolerated the procedure well and remained hemodynamically stable throughout. No complications encountered and no significant blood loss encountered IMPRESSION: Status post right IJ port catheter placement. Catheter ready for use. Signed, Dulcy Fanny. Earleen Newport, DO [facility] Electronically Signed   By: Corrie Mckusick D.O.   On: 11/22/2015 16:17    ELIGIBLE FOR AVAILABLE RESEARCH PROTOCOL: no  ASSESSMENT: 65 y.o. [place] woman status post right breast axillary tail lymph node biopsy 10/25/2015 for a clinical  TX N2, stage IIIA invasive ductal carcinoma, estrogen and progesterone receptor  positive, HER-2 nonamplified   (1) neoadjuvant chemotherapy starting 11/23/2015 will consist of cyclophosphamide and docetaxel in dose dense fashion 4 followed by weekly paclitaxel 12  (2) axillary lymph node dissection to follow chemotherapy  (3) adjuvant radiation to follow surgery  (4) anti-estrogens to follow at the completion of local treatment  (5) genetics testing pending    PLAN: I spent approximately 40 minutes today with Vaughan Basta going over how she should take her supportive medicines, as well as the specific indications for each medication and the possible side effects, toxicities and complications. She was given a "roadmap" explaining how to take these medicine each day and the week of treatment.  We also reviewed her port site and her echo results. Both are favorable. Finally I gave her copies of her staging studies which showed no evidence of stage IV disease  She is understandably anxious at this point, but is managing that well.. She has a good understanding of the overall plan as well as the specifics and she is ready to start  chemotherapy tomorrow.  She will see me again on the day 8 of treatment to make sure everything went well and the first cycle and to make any changes necessary before the second.  She has a good understanding of this plan. She knows the goal of treatment in her case is cure. She will call with any problems that may develop before the next visit.  Chauncey Cruel, MD   11/23/2015 6:01 PM Medical Oncology and Hematology [facility] [address] [place], [place] [address] Tel. [phone]    Fax. [phone]

## 2015-11-24 ENCOUNTER — Encounter: Payer: Self-pay | Admitting: *Deleted

## 2015-11-24 ENCOUNTER — Other Ambulatory Visit (HOSPITAL_BASED_OUTPATIENT_CLINIC_OR_DEPARTMENT_OTHER): Payer: BLUE CROSS/BLUE SHIELD

## 2015-11-24 ENCOUNTER — Telehealth: Payer: Self-pay | Admitting: Nurse Practitioner

## 2015-11-24 ENCOUNTER — Ambulatory Visit (HOSPITAL_BASED_OUTPATIENT_CLINIC_OR_DEPARTMENT_OTHER): Payer: BLUE CROSS/BLUE SHIELD

## 2015-11-24 VITALS — BP 139/52 | HR 88 | Temp 98.5°F | Resp 18

## 2015-11-24 DIAGNOSIS — C773 Secondary and unspecified malignant neoplasm of axilla and upper limb lymph nodes: Secondary | ICD-10-CM

## 2015-11-24 DIAGNOSIS — Z5111 Encounter for antineoplastic chemotherapy: Secondary | ICD-10-CM | POA: Diagnosis not present

## 2015-11-24 DIAGNOSIS — Z5189 Encounter for other specified aftercare: Secondary | ICD-10-CM | POA: Diagnosis not present

## 2015-11-24 DIAGNOSIS — C50611 Malignant neoplasm of axillary tail of right female breast: Secondary | ICD-10-CM

## 2015-11-24 DIAGNOSIS — C50911 Malignant neoplasm of unspecified site of right female breast: Secondary | ICD-10-CM

## 2015-11-24 LAB — CBC WITH DIFFERENTIAL/PLATELET
BASO%: 0.9 % (ref 0.0–2.0)
BASOS ABS: 0 10*3/uL (ref 0.0–0.1)
EOS ABS: 0.1 10*3/uL (ref 0.0–0.5)
EOS%: 1 % (ref 0.0–7.0)
HCT: 36.3 % (ref 34.8–46.6)
HEMOGLOBIN: 12.2 g/dL (ref 11.6–15.9)
LYMPH#: 1.4 10*3/uL (ref 0.9–3.3)
LYMPH%: 28.9 % (ref 14.0–49.7)
MCH: 29.1 pg (ref 25.1–34.0)
MCHC: 33.6 g/dL (ref 31.5–36.0)
MCV: 86.4 fL (ref 79.5–101.0)
MONO#: 0.3 10*3/uL (ref 0.1–0.9)
MONO%: 6 % (ref 0.0–14.0)
NEUT%: 63.2 % (ref 38.4–76.8)
NEUTROS ABS: 3.2 10*3/uL (ref 1.5–6.5)
Platelets: 208 10*3/uL (ref 145–400)
RBC: 4.19 10*6/uL (ref 3.70–5.45)
RDW: 13.3 % (ref 11.2–14.5)
WBC: 5 10*3/uL (ref 3.9–10.3)

## 2015-11-24 LAB — COMPREHENSIVE METABOLIC PANEL
ALBUMIN: 4.2 g/dL (ref 3.5–5.0)
ALK PHOS: 52 U/L (ref 40–150)
ALT: 28 U/L (ref 0–55)
AST: 23 U/L (ref 5–34)
Anion Gap: 13 mEq/L — ABNORMAL HIGH (ref 3–11)
BILIRUBIN TOTAL: 0.68 mg/dL (ref 0.20–1.20)
BUN: 23.9 mg/dL (ref 7.0–26.0)
CO2: 21 meq/L — AB (ref 22–29)
Calcium: 9.9 mg/dL (ref 8.4–10.4)
Chloride: 105 mEq/L (ref 98–109)
Creatinine: 1.4 mg/dL — ABNORMAL HIGH (ref 0.6–1.1)
EGFR: 38 mL/min/{1.73_m2} — AB (ref >90)
GLUCOSE: 230 mg/dL — AB (ref 70–140)
Potassium: 3.8 mEq/L (ref 3.5–5.1)
SODIUM: 139 meq/L (ref 136–145)
TOTAL PROTEIN: 7.6 g/dL (ref 6.4–8.3)

## 2015-11-24 MED ORDER — SODIUM CHLORIDE 0.9 % IV SOLN
Freq: Once | INTRAVENOUS | Status: AC
  Administered 2015-11-24: 10:00:00 via INTRAVENOUS

## 2015-11-24 MED ORDER — SODIUM CHLORIDE 0.9 % IV SOLN
Freq: Once | INTRAVENOUS | Status: AC
  Administered 2015-11-24: 10:00:00 via INTRAVENOUS
  Filled 2015-11-24: qty 5

## 2015-11-24 MED ORDER — PALONOSETRON HCL INJECTION 0.25 MG/5ML
INTRAVENOUS | Status: AC
  Filled 2015-11-24: qty 5

## 2015-11-24 MED ORDER — SODIUM CHLORIDE 0.9 % IV SOLN
600.0000 mg/m2 | Freq: Once | INTRAVENOUS | Status: AC
  Administered 2015-11-24: 1140 mg via INTRAVENOUS
  Filled 2015-11-24: qty 57

## 2015-11-24 MED ORDER — PEGFILGRASTIM 6 MG/0.6ML ~~LOC~~ PSKT
6.0000 mg | PREFILLED_SYRINGE | Freq: Once | SUBCUTANEOUS | Status: AC
  Administered 2015-11-24: 6 mg via SUBCUTANEOUS
  Filled 2015-11-24: qty 0.6

## 2015-11-24 MED ORDER — DOXORUBICIN HCL CHEMO IV INJECTION 2 MG/ML
60.0000 mg/m2 | Freq: Once | INTRAVENOUS | Status: AC
  Administered 2015-11-24: 114 mg via INTRAVENOUS
  Filled 2015-11-24: qty 57

## 2015-11-24 MED ORDER — PALONOSETRON HCL INJECTION 0.25 MG/5ML
0.2500 mg | Freq: Once | INTRAVENOUS | Status: AC
  Administered 2015-11-24: 0.25 mg via INTRAVENOUS

## 2015-11-24 MED ORDER — HEPARIN SOD (PORK) LOCK FLUSH 100 UNIT/ML IV SOLN
500.0000 [IU] | Freq: Once | INTRAVENOUS | Status: AC | PRN
  Administered 2015-11-24: 500 [IU]
  Filled 2015-11-24: qty 5

## 2015-11-24 MED ORDER — SODIUM CHLORIDE 0.9% FLUSH
10.0000 mL | INTRAVENOUS | Status: DC | PRN
  Administered 2015-11-24: 10 mL
  Filled 2015-11-24: qty 10

## 2015-11-24 NOTE — Telephone Encounter (Signed)
Patient was left a message that we are aware of her breast cancer diagnosis and if she has any questions or concerns for [place] to call back.

## 2015-11-24 NOTE — Progress Notes (Signed)
Blood return noted before, Every 3 cc and after Adriamycin push. Pt tolerated treatment well. Pt educated on discharge instructions and to call clinic with any concerns. Pt verbalizes understanding. Pt stable at time of discharge.

## 2015-11-24 NOTE — Patient Instructions (Signed)
[facility] Discharge Instructions for Patients Receiving Chemotherapy  Today you received the following chemotherapy agents: Adriamycin and Cytoxan   To help prevent nausea and vomiting after your treatment, we encourage you to take your nausea medication as directed.  Take Compazine10 mg tablet by mouth every 6 hours as needed for Nausea or vomiting.   If you develop nausea and vomiting that is not controlled by your nausea medication, call the clinic.   BELOW ARE SYMPTOMS THAT SHOULD BE REPORTED IMMEDIATELY:  *FEVER GREATER THAN 100.5 F  *CHILLS WITH OR WITHOUT FEVER  NAUSEA AND VOMITING THAT IS NOT CONTROLLED WITH YOUR NAUSEA MEDICATION  *UNUSUAL SHORTNESS OF BREATH  *UNUSUAL BRUISING OR BLEEDING  TENDERNESS IN MOUTH AND THROAT WITH OR WITHOUT PRESENCE OF ULCERS  *URINARY PROBLEMS  *BOWEL PROBLEMS  UNUSUAL RASH Items with * indicate a potential emergency and should be followed up as soon as possible.  Feel free to call the clinic you have any questions or concerns. The clinic phone number is [phone].  Please show the [place] at check-in to the Emergency Department and triage nurse.   Doxorubicin injection What is this medicine? DOXORUBICIN (dox oh ROO bi sin) is a chemotherapy drug. It is used to treat many kinds of cancer like Hodgkin's disease, leukemia, non-Hodgkin's lymphoma, neuroblastoma, sarcoma, and Wilms' tumor. It is also used to treat bladder cancer, breast cancer, lung cancer, ovarian cancer, stomach cancer, and thyroid cancer. This medicine may be used for other purposes; ask your health care provider or pharmacist if you have questions. What should I tell my health care provider before I take this medicine? They need to know if you have any of these conditions: -blood disorders -heart disease, recent heart attack -infection (especially a virus infection such as chickenpox, cold sores, or herpes) -irregular heartbeat -liver  disease -recent or ongoing radiation therapy -an unusual or allergic reaction to doxorubicin, other chemotherapy agents, other medicines, foods, dyes, or preservatives -pregnant or trying to get pregnant -breast-feeding How should I use this medicine? This drug is given as an infusion into a vein. It is administered in a hospital or clinic by a specially trained health care professional. If you have pain, swelling, burning or any unusual feeling around the site of your injection, tell your health care professional right away. Talk to your pediatrician regarding the use of this medicine in children. Special care may be needed. Overdosage: If you think you have taken too much of this medicine contact a poison control center or emergency room at once. NOTE: This medicine is only for you. Do not share this medicine with others. What if I miss a dose? It is important not to miss your dose. Call your doctor or health care professional if you are unable to keep an appointment. What may interact with this medicine? Do not take this medicine with any of the following medications: -cisapride -droperidol -halofantrine -pimozide -zidovudine This medicine may also interact with the following medications: -chloroquine -chlorpromazine -clarithromycin -cyclophosphamide -cyclosporine -erythromycin -medicines for depression, anxiety, or psychotic disturbances -medicines for irregular heart beat like amiodarone, bepridil, dofetilide, encainide, flecainide, propafenone, quinidine -medicines for seizures like ethotoin, fosphenytoin, phenytoin -medicines for nausea, vomiting like dolasetron, ondansetron, palonosetron -medicines to increase blood counts like filgrastim, pegfilgrastim, sargramostim -methadone -methotrexate -pentamidine -progesterone -vaccines -verapamil Talk to your doctor or health care professional before taking any of these  medicines: -acetaminophen -aspirin -ibuprofen -ketoprofen -naproxen This list may not describe all possible interactions. Give your health  care provider a list of all the medicines, herbs, non-prescription drugs, or dietary supplements you use. Also tell them if you smoke, drink alcohol, or use illegal drugs. Some items may interact with your medicine. What should I watch for while using this medicine? Your condition will be monitored carefully while you are receiving this medicine. You will need important blood work done while you are taking this medicine. This drug may make you feel generally unwell. This is not uncommon, as chemotherapy can affect healthy cells as well as cancer cells. Report any side effects. Continue your course of treatment even though you feel ill unless your doctor tells you to stop. Your urine may turn red for a few days after your dose. This is not blood. If your urine is dark or brown, call your doctor. In some cases, you may be given additional medicines to help with side effects. Follow all directions for their use. Call your doctor or health care professional for advice if you get a fever, chills or sore throat, or other symptoms of a cold or flu. Do not treat yourself. This drug decreases your body's ability to fight infections. Try to avoid being around people who are sick. This medicine may increase your risk to bruise or bleed. Call your doctor or health care professional if you notice any unusual bleeding. Be careful brushing and flossing your teeth or using a toothpick because you may get an infection or bleed more easily. If you have any dental work done, tell your dentist you are receiving this medicine. Avoid taking products that contain aspirin, acetaminophen, ibuprofen, naproxen, or ketoprofen unless instructed by your doctor. These medicines may hide a fever. Men and women of childbearing age should use effective birth control methods while using taking this  medicine. Do not become pregnant while taking this medicine. There is a potential for serious side effects to an unborn child. Talk to your health care professional or pharmacist for more information. Do not breast-feed an infant while taking this medicine. Do not let others touch your urine or other body fluids for 5 days after each treatment with this medicine. Caregivers should wear latex gloves to avoid touching body fluids during this time. There is a maximum amount of this medicine you should receive throughout your life. The amount depends on the medical condition being treated and your overall health. Your doctor will watch how much of this medicine you receive in your lifetime. Tell your doctor if you have taken this medicine before. What side effects may I notice from receiving this medicine? Side effects that you should report to your doctor or health care professional as soon as possible: -allergic reactions like skin rash, itching or hives, swelling of the face, lips, or tongue -low blood counts - this medicine may decrease the number of white blood cells, red blood cells and platelets. You may be at increased risk for infections and bleeding. -signs of infection - fever or chills, cough, sore throat, pain or difficulty passing urine -signs of decreased platelets or bleeding - bruising, pinpoint red spots on the skin, black, tarry stools, blood in the urine -signs of decreased red blood cells - unusually weak or tired, fainting spells, lightheadedness -breathing problems -chest pain -fast, irregular heartbeat -mouth sores -nausea, vomiting -pain, swelling, redness at site where injected -pain, tingling, numbness in the hands or feet -swelling of ankles, feet, or hands -unusual bleeding or bruising Side effects that usually do not require medical attention (report to your doctor  or health care professional if they continue or are bothersome): -diarrhea -facial flushing -hair  loss -loss of appetite -missed menstrual periods -nail discoloration or damage -red or watery eyes -red colored urine -stomach upset This list may not describe all possible side effects. Call your doctor for medical advice about side effects. You may report side effects to FDA at 1-800-FDA-1088. Where should I keep my medicine? This drug is given in a hospital or clinic and will not be stored at home. NOTE: This sheet is a summary. It may not cover all possible information. If you have questions about this medicine, talk to your doctor, pharmacist, or health care provider.    2016, Elsevier/Gold Standard. (2012-08-25 09:54:34)    Cyclophosphamide injection What is this medicine? CYCLOPHOSPHAMIDE (sye kloe FOSS fa mide) is a chemotherapy drug. It slows the growth of cancer cells. This medicine is used to treat many types of cancer like lymphoma, myeloma, leukemia, breast cancer, and ovarian cancer, to name a few. This medicine may be used for other purposes; ask your health care provider or pharmacist if you have questions. What should I tell my health care provider before I take this medicine? They need to know if you have any of these conditions: -blood disorders -history of other chemotherapy -infection -kidney disease -liver disease -recent or ongoing radiation therapy -tumors in the bone marrow -an unusual or allergic reaction to cyclophosphamide, other chemotherapy, other medicines, foods, dyes, or preservatives -pregnant or trying to get pregnant -breast-feeding How should I use this medicine? This drug is usually given as an injection into a vein or muscle or by infusion into a vein. It is administered in a hospital or clinic by a specially trained health care professional. Talk to your pediatrician regarding the use of this medicine in children. Special care may be needed. Overdosage: If you think you have taken too much of this medicine contact a poison control center or  emergency room at once. NOTE: This medicine is only for you. Do not share this medicine with others. What if I miss a dose? It is important not to miss your dose. Call your doctor or health care professional if you are unable to keep an appointment. What may interact with this medicine? This medicine may interact with the following medications: -amiodarone -amphotericin B -azathioprine -certain antiviral medicines for HIV or AIDS such as protease inhibitors (e.g., indinavir, ritonavir) and zidovudine -certain blood pressure medications such as benazepril, captopril, enalapril, fosinopril, lisinopril, moexipril, monopril, perindopril, quinapril, ramipril, trandolapril -certain cancer medications such as anthracyclines (e.g., daunorubicin, doxorubicin), busulfan, cytarabine, paclitaxel, pentostatin, tamoxifen, trastuzumab -certain diuretics such as chlorothiazide, chlorthalidone, hydrochlorothiazide, indapamide, metolazone -certain medicines that treat or prevent blood clots like warfarin -certain muscle relaxants such as succinylcholine -cyclosporine -etanercept -indomethacin -medicines to increase blood counts like filgrastim, pegfilgrastim, sargramostim -medicines used as general anesthesia -metronidazole -natalizumab This list may not describe all possible interactions. Give your health care provider a list of all the medicines, herbs, non-prescription drugs, or dietary supplements you use. Also tell them if you smoke, drink alcohol, or use illegal drugs. Some items may interact with your medicine. What should I watch for while using this medicine? Visit your doctor for checks on your progress. This drug may make you feel generally unwell. This is not uncommon, as chemotherapy can affect healthy cells as well as cancer cells. Report any side effects. Continue your course of treatment even though you feel ill unless your doctor tells you to stop. Drink water or other  fluids as directed.  Urinate often, even at night. In some cases, you may be given additional medicines to help with side effects. Follow all directions for their use. Call your doctor or health care professional for advice if you get a fever, chills or sore throat, or other symptoms of a cold or flu. Do not treat yourself. This drug decreases your body's ability to fight infections. Try to avoid being around people who are sick. This medicine may increase your risk to bruise or bleed. Call your doctor or health care professional if you notice any unusual bleeding. Be careful brushing and flossing your teeth or using a toothpick because you may get an infection or bleed more easily. If you have any dental work done, tell your dentist you are receiving this medicine. You may get drowsy or dizzy. Do not drive, use machinery, or do anything that needs mental alertness until you know how this medicine affects you. Do not become pregnant while taking this medicine or for 1 year after stopping it. Women should inform their doctor if they wish to become pregnant or think they might be pregnant. Men should not father a child while taking this medicine and for 4 months after stopping it. There is a potential for serious side effects to an unborn child. Talk to your health care professional or pharmacist for more information. Do not breast-feed an infant while taking this medicine. This medicine may interfere with the ability to have a child. This medicine has caused ovarian failure in some women. This medicine has caused reduced sperm counts in some men. You should talk with your doctor or health care professional if you are concerned about your fertility. If you are going to have surgery, tell your doctor or health care professional that you have taken this medicine. What side effects may I notice from receiving this medicine? Side effects that you should report to your doctor or health care professional as soon as  possible: -allergic reactions like skin rash, itching or hives, swelling of the face, lips, or tongue -low blood counts - this medicine may decrease the number of white blood cells, red blood cells and platelets. You may be at increased risk for infections and bleeding. -signs of infection - fever or chills, cough, sore throat, pain or difficulty passing urine -signs of decreased platelets or bleeding - bruising, pinpoint red spots on the skin, black, tarry stools, blood in the urine -signs of decreased red blood cells - unusually weak or tired, fainting spells, lightheadedness -breathing problems -dark urine -dizziness -palpitations -swelling of the ankles, feet, hands -trouble passing urine or change in the amount of urine -weight gain -yellowing of the eyes or skin Side effects that usually do not require medical attention (report to your doctor or health care professional if they continue or are bothersome): -changes in nail or skin color -hair loss -missed menstrual periods -mouth sores -nausea, vomiting This list may not describe all possible side effects. Call your doctor for medical advice about side effects. You may report side effects to FDA at 1-800-FDA-1088. Where should I keep my medicine? This drug is given in a hospital or clinic and will not be stored at home. NOTE: This sheet is a summary. It may not cover all possible information. If you have questions about this medicine, talk to your doctor, pharmacist, or health care provider.    2016, [employer]. (2012-03-13 16:22:58)

## 2015-11-25 ENCOUNTER — Ambulatory Visit: Payer: BLUE CROSS/BLUE SHIELD

## 2015-11-27 ENCOUNTER — Ambulatory Visit: Payer: BLUE CROSS/BLUE SHIELD | Admitting: Physical Therapy

## 2015-11-27 ENCOUNTER — Telehealth: Payer: Self-pay

## 2015-11-27 ENCOUNTER — Encounter: Payer: Self-pay | Admitting: Physical Therapy

## 2015-11-27 DIAGNOSIS — M25612 Stiffness of left shoulder, not elsewhere classified: Secondary | ICD-10-CM

## 2015-11-27 DIAGNOSIS — R293 Abnormal posture: Secondary | ICD-10-CM

## 2015-11-27 DIAGNOSIS — M25611 Stiffness of right shoulder, not elsewhere classified: Secondary | ICD-10-CM | POA: Diagnosis not present

## 2015-11-27 DIAGNOSIS — M25511 Pain in right shoulder: Secondary | ICD-10-CM

## 2015-11-27 DIAGNOSIS — M25512 Pain in left shoulder: Secondary | ICD-10-CM

## 2015-11-27 NOTE — Telephone Encounter (Signed)
lvm chemo follow up call. Give [place] a call if any questions or concerns.

## 2015-11-27 NOTE — Telephone Encounter (Signed)
-----   Message from Egbert Garibaldi, RN sent at 11/24/2015  1:04 PM EDT ----- Regarding: Dr. Jana Hakim chemo F/U call  Pt of Dr. Jana Hakim, First time Adriamycin and Cytoxan. Pt tolerated treatment well.

## 2015-11-27 NOTE — Therapy (Signed)
[place], [place], [address] Phone: [phone]   Fax:  [phone]  Physical Therapy Treatment  Patient Details  Name: Gina Navarro MRN: [id number] Date of Birth: 02-21-1951 Referring Provider: Dr. Erroll Luna  Encounter Date: 11/27/2015      PT End of Session - 11/27/15 1648    Visit Number 4   Number of Visits 9   Date for PT Re-Evaluation 12/11/15   PT Start Time 6767   PT Stop Time 1456  3 units is correct- 10 min was ice   PT Time Calculation (min) 53 min   Activity Tolerance Patient tolerated treatment well   Behavior During Therapy [facility] for tasks assessed/performed      Past Medical History  Diagnosis Date  . Arthritis   . Menopausal state   . Hypertension 2006  . Diabetes mellitus without complication ([place]) 2094    oral meds until Victoza in 07/2012, now change to Invokana  . Breast cancer metastasized to axillary lymph node ([place]) 11/02/2015  . Skin cancer     Past Surgical History  Procedure Laterality Date  . Tonsillectomy and adenoidectomy    . Mohs surgery  2002    chin x 1 and right forehead X 1 in 2004    There were no vitals filed for this visit.      Subjective Assessment - 11/27/15 1604    Subjective I had my first chemotherapy and got my port placed. I am a little slow today but I am ok.    Pertinent History Patient was diagnosed on 10/25/15 with metastatic breast cancer in her right axillary lymph nodes.  She has no primary mass found but has multiple positive axillary lymph nodes with the largest measuring 5.7 cm.  It is 95% ER positive and 10% PR positive, HER2 negative with a Ki67 of 30%.  She also has complaints of bilateral shoulder arthritis which significantly limits her range of motion and function.  There is also concern about being able ot obtain positioning for surgery and radiation if ROM does not improve.   Patient Stated Goals Increase bilateral  shoulder ROM, learn lymphedema risk reduction practices, learn post op shoulder ROM HEP   Currently in Pain? No/denies   Pain Score 0-No pain                         [facility] Adult PT Treatment/Exercise - 11/27/15 0001    Shoulder Exercises: Standing   ABduction AAROM;Right;Left;5 reps;Other (comment)  with finger ladder; got to #27 on R, 28 on left   Shoulder Exercises: Pulleys   Flexion 2 minutes   ABduction 2 minutes   Shoulder Exercises: Therapy Ball   Flexion 10 reps   Modalities   Modalities Cryotherapy   Cryotherapy   Number Minutes Cryotherapy 10 Minutes   Cryotherapy Location Shoulder  both   Type of Cryotherapy Ice pack   Manual Therapy   Manual Therapy Myofascial release   Myofascial Release myofascial pulling of each UE with some movement into abduction, done in supine   Passive ROM In supine to each shoulder into ER, abduction, and flexion with gentle stretch to patient tolerance.                       Long Term Clinic Goals - 11/08/15 1558    CC Long Term Goal  #1   Title Patient will be independent with her  home exercise program for shoulder ROM   Time 4   Period Weeks   Status New   CC Long Term Goal  #2   Title Patient will increase bilateral shoulder flexion ROM to >/= 120 degrees actively to tolerate surgery positioning.   Baseline 90 on right, 99 on left   Time 4   Period Weeks   Status New   CC Long Term Goal  #3   Title Patient will increase bilateral shoulder abduction ROM to >/= 120 degrees actively to tolerate surgery positioning.   Baseline 102 bilaterally   Time 4   Period Weeks   Status New   CC Long Term Goal  #4   Title Patient will report bilateral shoulder pain has improved by >/= 25% to tolerate daily tasks with greater ease.   Time 4   Period Weeks   Status New            Plan - 11/27/15 1649    Clinical Impression Statement Pt tolerated PROM and AAROM exercises well today. She got her portacath  placed but did not have any increase in pain with exercises today. Pt reports relief from cold pack at end of session.    Rehab Potential Good   Clinical Impairments Affecting Rehab Potential Active cancer   PT Frequency 2x / week   PT Duration 4 weeks   PT Treatment/Interventions ADLs/Self Care Home Management;Patient/family education;Passive range of motion;Electrical Stimulation;Moist Heat;Therapeutic exercise;Manual techniques;Cryotherapy   PT Next Visit Plan Add to HEP as needed; P/AA/A/ROM to both shoulders and cold pack after session.   PT Home Exercise Plan Do MDC ROM exercises   Consulted and Agree with Plan of Care Patient      Patient will benefit from skilled therapeutic intervention in order to improve the following deficits and impairments:  Decreased strength, Postural dysfunction, Decreased knowledge of precautions, Pain, Impaired UE functional use, Decreased range of motion  Visit Diagnosis: Stiffness of right shoulder, not elsewhere classified  Stiffness of left shoulder, not elsewhere classified  Pain in right shoulder  Pain in left shoulder  Abnormal posture     Problem List Patient Active Problem List   Diagnosis Date Noted  . Cancer of axillary tail of right female breast ([place]) 11/08/2015  . Breast cancer metastasized to axillary lymph node ([place]) 11/02/2015  . BMI 30.0-30.9,adult 08/09/2014  . Carotid stenosis, bilateral 08/31/2013  . Exertional chest pain 08/31/2013  . Type 2 diabetes mellitus with peripheral neuropathy ([place]) 04/28/2012  . HTN (hypertension) 04/28/2012  . Hyperlipidemia LDL goal <70 04/28/2012    Alexia Freestone 11/27/2015, 4:51 PM  [place], [place], [address] Phone: [phone]   Fax:  [phone]  Name: AZARRIA BALINT MRN: [id number] Date of Birth: Oct 29, 1950    Allyson Sabal, PT 11/27/2015 4:51 PM

## 2015-11-29 ENCOUNTER — Encounter: Payer: Self-pay | Admitting: Physician Assistant

## 2015-11-29 ENCOUNTER — Ambulatory Visit: Payer: BLUE CROSS/BLUE SHIELD

## 2015-11-29 DIAGNOSIS — M25511 Pain in right shoulder: Secondary | ICD-10-CM

## 2015-11-29 DIAGNOSIS — M25612 Stiffness of left shoulder, not elsewhere classified: Secondary | ICD-10-CM

## 2015-11-29 DIAGNOSIS — M25512 Pain in left shoulder: Secondary | ICD-10-CM

## 2015-11-29 DIAGNOSIS — M25611 Stiffness of right shoulder, not elsewhere classified: Secondary | ICD-10-CM

## 2015-11-29 DIAGNOSIS — R293 Abnormal posture: Secondary | ICD-10-CM

## 2015-11-29 NOTE — Therapy (Signed)
[place], [place], [address] Phone: [phone]   Fax:  [phone]  Physical Therapy Treatment  Patient Details  Name: Gina Navarro MRN: [id number] Date of Birth: 1951-02-29 Referring Provider: Dr. Erroll Luna  Encounter Date: 11/29/2015      PT End of Session - 11/29/15 1657    Visit Number 5   Number of Visits 9   Date for PT Re-Evaluation 12/11/15   PT Start Time 1611   PT Stop Time 1657   PT Time Calculation (min) 46 min   Activity Tolerance Patient tolerated treatment well   Behavior During Therapy [facility] for tasks assessed/performed      Past Medical History  Diagnosis Date  . Arthritis   . Menopausal state   . Hypertension 2006  . Diabetes mellitus without complication ([place]) 2233    oral meds until Victoza in 07/2012, now change to Invokana  . Breast cancer metastasized to axillary lymph node ([place]) 11/02/2015  . Skin cancer     Past Surgical History  Procedure Laterality Date  . Tonsillectomy and adenoidectomy    . Mohs surgery  2002    chin x 1 and right forehead X 1 in 2004    There were no vitals filed for this visit.      Subjective Assessment - 11/29/15 1614    Subjective Doing good, nothing new. Doing the exercises once a day. I can tell such an improvement with pain from doing the exercises, I don't feel it anymore at night either.    Pertinent History Patient was diagnosed on 10/25/15 with metastatic breast cancer in her right axillary lymph nodes.  She has no primary mass found but has multiple positive axillary lymph nodes with the largest measuring 5.7 cm.  It is 95% ER positive and 10% PR positive, HER2 negative with a Ki67 of 30%.  She also has complaints of bilateral shoulder arthritis which significantly limits her range of motion and function.  There is also concern about being able ot obtain positioning for surgery and radiation if ROM does not improve.   Patient  Stated Goals Increase bilateral shoulder ROM, learn lymphedema risk reduction practices, learn post op shoulder ROM HEP   Currently in Pain? No/denies            [facility] PT Assessment - 11/29/15 0001    AROM   Right Shoulder Flexion 134 Degrees   Right Shoulder ABduction 119 Degrees   Left Shoulder Flexion 140 Degrees   Left Shoulder ABduction 127 Degrees                     [facility] Adult PT Treatment/Exercise - 11/29/15 0001    Shoulder Exercises: Standing   Horizontal ABduction Strengthening;Both;10 reps;Theraband   Theraband Level (Shoulder Horizontal ABduction) Level 2 (Red)   External Rotation Strengthening;Both;10 reps;Theraband   Theraband Level (Shoulder External Rotation) Level 2 (Red)   Shoulder Exercises: Pulleys   Flexion 2 minutes   ABduction 2 minutes   Shoulder Exercises: Therapy Ball   Flexion 10 reps   Shoulder Exercises: ROM/Strengthening   Other ROM/Strengthening Exercises --   Manual Therapy   Manual Therapy Myofascial release;Passive ROM   Myofascial Release myofascial pulling of each UE with some movement into abduction, done in supine   Passive ROM In supine to each shoulder into ER, abduction, and flexion with gentle stretch to patient tolerance.  PT Education - 11/29/15 1656    Education provided Yes   Education Details Red theraband issued for horizontal abduction and er in standing    Person(s) Educated Patient   Methods Explanation;Demonstration;Verbal cues  Pt reported not needing a handout   Comprehension Verbalized understanding;Returned demonstration              [place] - 11/29/15 1702    CC Long Term Goal  #1   Title Patient will be independent with her home exercise program for shoulder ROM   Status On-going   CC Long Term Goal  #2   Title Patient will increase bilateral shoulder flexion ROM to >/= 120 degrees actively to tolerate surgery positioning.   Baseline 90 on right, 99 on  left; Rt 134 and Lt 140 degrees 11/29/15   Status Achieved   CC Long Term Goal  #3   Title Patient will increase bilateral shoulder abduction ROM to >/= 120 degrees actively to tolerate surgery positioning.   Baseline 102 bilaterally; Rt 119 and Lt 127 degrees 11/29/15   Status Partially Met   CC Long Term Goal  #4   Title Patient will report bilateral shoulder pain has improved by >/= 25% to tolerate daily tasks with greater ease.  Pt reports 90% improvement at this time. 11/29/15   Status Achieved            Plan - 11/29/15 1700    Clinical Impression Statement Pts A/ROM measurements have improved since last week meeting one ROM goal and partially meeting the other. She also reports that her pain has greatly improved by <90% meeting that goal as well. Eased into exercises this week as pt had her port placed last week and she has still been a little sore this week but did add 2 scapular exercises to her HEP today that she will try over the weekend as she tolerated them well today and reported they felt good.    Rehab Potential Good   Clinical Impairments Affecting Rehab Potential Active cancer   PT Frequency 2x / week   PT Duration 4 weeks   PT Treatment/Interventions ADLs/Self Care Home Management;Patient/family education;Passive range of motion;Electrical Stimulation;Moist Heat;Therapeutic exercise;Manual techniques;Cryotherapy   PT Next Visit Plan Add to HEP as needed, try supine scapular series continuing with red theraband; P/AA/A/ROM to both shoulders and cold pack after session prn.   Consulted and Agree with Plan of Care Patient      Patient will benefit from skilled therapeutic intervention in order to improve the following deficits and impairments:  Decreased strength, Postural dysfunction, Decreased knowledge of precautions, Pain, Impaired UE functional use, Decreased range of motion  Visit Diagnosis: Stiffness of right shoulder, not elsewhere classified  Stiffness of left  shoulder, not elsewhere classified  Pain in right shoulder  Pain in left shoulder  Abnormal posture     Problem List Patient Active Problem List   Diagnosis Date Noted  . Cancer of axillary tail of right female breast ([place]) 11/08/2015  . Breast cancer metastasized to axillary lymph node ([place]) 11/02/2015  . BMI 30.0-30.9,adult 08/09/2014  . Carotid stenosis, bilateral 08/31/2013  . Exertional chest pain 08/31/2013  . Type 2 diabetes mellitus with peripheral neuropathy ([place]) 04/28/2012  . HTN (hypertension) 04/28/2012  . Hyperlipidemia LDL goal <70 04/28/2012    Otelia Limes, PTA 11/29/2015, 5:07 PM  [place], [place], [address] Phone: [phone]   Fax:  [phone]  Name: Gina Navarro MRN: [id number] Date of Birth: 1950-11-13

## 2015-11-30 ENCOUNTER — Other Ambulatory Visit: Payer: Self-pay | Admitting: *Deleted

## 2015-11-30 DIAGNOSIS — C50911 Malignant neoplasm of unspecified site of right female breast: Secondary | ICD-10-CM

## 2015-11-30 DIAGNOSIS — C773 Secondary and unspecified malignant neoplasm of axilla and upper limb lymph nodes: Principal | ICD-10-CM

## 2015-12-01 ENCOUNTER — Ambulatory Visit (HOSPITAL_BASED_OUTPATIENT_CLINIC_OR_DEPARTMENT_OTHER): Payer: BLUE CROSS/BLUE SHIELD

## 2015-12-01 ENCOUNTER — Other Ambulatory Visit (HOSPITAL_BASED_OUTPATIENT_CLINIC_OR_DEPARTMENT_OTHER): Payer: BLUE CROSS/BLUE SHIELD

## 2015-12-01 ENCOUNTER — Ambulatory Visit (HOSPITAL_BASED_OUTPATIENT_CLINIC_OR_DEPARTMENT_OTHER): Payer: BLUE CROSS/BLUE SHIELD | Admitting: Oncology

## 2015-12-01 VITALS — BP 132/52 | HR 101 | Temp 98.0°F | Resp 18 | Wt 173.5 lb

## 2015-12-01 DIAGNOSIS — C50911 Malignant neoplasm of unspecified site of right female breast: Secondary | ICD-10-CM

## 2015-12-01 DIAGNOSIS — C773 Secondary and unspecified malignant neoplasm of axilla and upper limb lymph nodes: Principal | ICD-10-CM

## 2015-12-01 DIAGNOSIS — C50611 Malignant neoplasm of axillary tail of right female breast: Secondary | ICD-10-CM

## 2015-12-01 DIAGNOSIS — Z95828 Presence of other vascular implants and grafts: Secondary | ICD-10-CM

## 2015-12-01 DIAGNOSIS — Z452 Encounter for adjustment and management of vascular access device: Secondary | ICD-10-CM | POA: Diagnosis not present

## 2015-12-01 DIAGNOSIS — Z170 Estrogen receptor positive status [ER+]: Secondary | ICD-10-CM | POA: Diagnosis not present

## 2015-12-01 LAB — COMPREHENSIVE METABOLIC PANEL
ALT: 22 U/L (ref 0–55)
ANION GAP: 11 meq/L (ref 3–11)
AST: 15 U/L (ref 5–34)
Albumin: 3.8 g/dL (ref 3.5–5.0)
Alkaline Phosphatase: 66 U/L (ref 40–150)
BUN: 32.7 mg/dL — AB (ref 7.0–26.0)
CALCIUM: 9.7 mg/dL (ref 8.4–10.4)
CHLORIDE: 103 meq/L (ref 98–109)
CO2: 22 meq/L (ref 22–29)
Creatinine: 1.3 mg/dL — ABNORMAL HIGH (ref 0.6–1.1)
EGFR: 45 mL/min/{1.73_m2} — ABNORMAL LOW (ref >90)
Glucose: 187 mg/dl — ABNORMAL HIGH (ref 70–140)
POTASSIUM: 4.6 meq/L (ref 3.5–5.1)
Sodium: 136 mEq/L (ref 136–145)
Total Bilirubin: 0.54 mg/dL (ref 0.20–1.20)
Total Protein: 6.7 g/dL (ref 6.4–8.3)

## 2015-12-01 LAB — CBC WITH DIFFERENTIAL/PLATELET
BASO%: 0.7 % (ref 0.0–2.0)
BASOS ABS: 0 10*3/uL (ref 0.0–0.1)
EOS%: 1.2 % (ref 0.0–7.0)
Eosinophils Absolute: 0.1 10*3/uL (ref 0.0–0.5)
HEMATOCRIT: 36 % (ref 34.8–46.6)
HGB: 11.9 g/dL (ref 11.6–15.9)
LYMPH#: 1.1 10*3/uL (ref 0.9–3.3)
LYMPH%: 20.4 % (ref 14.0–49.7)
MCH: 28.6 pg (ref 25.1–34.0)
MCHC: 33 g/dL (ref 31.5–36.0)
MCV: 86.7 fL (ref 79.5–101.0)
MONO#: 0.2 10*3/uL (ref 0.1–0.9)
MONO%: 4 % (ref 0.0–14.0)
NEUT#: 4.1 10*3/uL (ref 1.5–6.5)
NEUT%: 73.7 % (ref 38.4–76.8)
Platelets: 127 10*3/uL — ABNORMAL LOW (ref 145–400)
RBC: 4.15 10*6/uL (ref 3.70–5.45)
RDW: 13.2 % (ref 11.2–14.5)
WBC: 5.5 10*3/uL (ref 3.9–10.3)

## 2015-12-01 MED ORDER — SODIUM CHLORIDE 0.9% FLUSH
10.0000 mL | INTRAVENOUS | Status: DC | PRN
  Administered 2015-12-01: 10 mL via INTRAVENOUS
  Filled 2015-12-01: qty 10

## 2015-12-01 MED ORDER — HEPARIN SOD (PORK) LOCK FLUSH 100 UNIT/ML IV SOLN
500.0000 [IU] | Freq: Once | INTRAVENOUS | Status: AC
  Administered 2015-12-01: 500 [IU] via INTRAVENOUS
  Filled 2015-12-01: qty 5

## 2015-12-01 NOTE — Progress Notes (Signed)
[place]  [phone] [phone]     ID: [id number] COCCO DOB: [birth year]  MR#: [id number]  YTK#:[id number]  Patient Care Team: Harrison Mons, PA-C as PCP - General (Physician Assistant) Jari Pigg, MD as Attending Physician (Dermatology) Kem Boroughs, FNP as Nurse Practitioner (Obstetrics and Gynecology) Adrian Prows, MD as Consulting Physician (Cardiology) Erroll Luna, MD as Consulting Physician (General Surgery) Chauncey Cruel, MD as Consulting Physician (Oncology) Eppie Gibson, MD as Attending Physician (Radiation Oncology) PCP: Harrison Mons, PA-C Chauncey Cruel, MD OTHER MD:  CHIEF COMPLAINT: Estrogen receptor positive breast cancer  CURRENT TREATMENT: Neoadjuvant chemotherapy   BREAST CANCER HISTORY: From the original intake note:  "Gina Navarro" noted a mass in her right axilla 10/20/2015 and brought it to medical attention. She was scheduled for bilateral diagnostic mammography with tomography at [facility] 10/24/2015. The breast composition was category B. Mammogram of the breast showed no suspicious masses. However there was palpable right axillary adenopathy, more specifically a 5 cm "lump beneath the anterior right axilla, and ultrasound was obtained.. On ultrasound, the breast itself was unremarkable. However there were multiple enlarged right axillary lymph nodes the largest measuring 5.7 cm.   Biopsy of one of the enlarged right axillary lymph nodes on 10/25/2015 showed metastatic carcinoma, which was cytokeratin 7 positive, estrogen receptor 95% positive, progesterone receptor 10% positive, both with strong staining intensity, and HER-2 nonamplified, with a signals ratio 1.62, and the number percell 2.10.  MRI of the breasts 11/06/2015 again showed no mass or abnormal enhancement in the right breast or left breast. In the right axilla there were 6 enlarged grossly abnormal lymph nodes.  The patient's subsequent history is as detailed  below  INTERVAL HISTORY: Gina Navarro returns today for follow-up of her estrogen receptor positive breast cancer, which is being treated neoadjuvantly. Today is day 8 cycle 1 of 4 planned cycles of cyclophosphamide and doxorubicin, to be followed by weekly paclitaxel 12.  REVIEW OF SYSTEMS: Gina Navarro did "quite a oblique" with her first cycle of chemotherapy. She had a mild headache the afternoon of day 1 area she felt a little woozy and ahead for the next couple of days, but she had no nausea and no vomiting. She had a little bit of stomach upset, but continued to eat many small meals and to keep herself well-hydrated. She had no mouth sores. Her port worked well. She had no change in bowel or bladder habits. She continued to work, basing herself. She was very careful to keep her hands washed. Aside from these issues a detailed review of systems was stable.  PAST MEDICAL HISTORY: Past Medical History  Diagnosis Date  . Arthritis   . Menopausal state   . Hypertension 2006  . Diabetes mellitus without complication ([place]) 5573    oral meds until Victoza in 07/2012, now change to Invokana  . Breast cancer metastasized to axillary lymph node ([place]) 11/02/2015  . Skin cancer    Vaughan Basta  PAST SURGICAL HISTORY: Past Surgical History  Procedure Laterality Date  . Tonsillectomy and adenoidectomy    . Mohs surgery  2002    chin x 1 and right forehead X 1 in 2004    FAMILY HISTORY Family History  Problem Relation Age of Onset  . Adopted: Yes  . Liver cancer Mother   . Lung cancer Mother   . Brain cancer Father   The patient is adopted and knows little about her biological family. Both are adopted parents have died. Her adopted Brother (not  biologically related) Lowry Ram, lives in [place].  GYNECOLOGIC HISTORY:  Patient's last menstrual period was 05/13/1996. Menarche age 35, the patient is GX P0. She went through menopause in her early 41s and did not take hormone replacement. She never used  oral contraceptives.  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 She is contracted by group who contracts to the state. She is single and lives alone with 4 dogs. She has a large fasting yard. She attends the [employer]    ADVANCED DIRECTIVES: The patient completed a healthcare part of attorney document and a copy was filed to be scanned 12/01/2015. She has named [employer] representative as her healthcare power of attorney. She can be reached at [place]: Social History  Substance Use Topics  . Smoking status: Former Smoker -- 1.00 packs/day for 5 years    Types: Cigarettes  . Smokeless tobacco: Never Used  . Alcohol Use: No     Colonoscopy: Never  PAP:  Bone density: Never   Allergies  Allergen Reactions  . Bee Venom   . Statins Other (See Comments)    MYALGIAS with Crestor, Zocor. Tolerating Lipitor (05/2013)  . Victoza [Liraglutide]     Dizziness, peripheral neuropathy, constipation, heart palpitations    Current Outpatient Prescriptions  Medication Sig Dispense Refill  . aspirin 81 MG tablet Take 81 mg by mouth daily.    . canagliflozin (INVOKANA) 300 MG TABS tablet Take 1 tablet (300 mg total) by mouth daily before breakfast. 90 tablet 3  . cholecalciferol (VITAMIN D) 1000 UNITS tablet Take 1,000 Units by mouth daily.    Marland Kitchen dexamethasone (DECADRON) 4 MG tablet Take 2 tablets by mouth once a day on the day after chemotherapy and then take 2 tablets two times a day for 2 days. Take with food. 30 tablet 1  . glucosamine-chondroitin 500-400 MG tablet Take 1 tablet by mouth 3 (three) times daily.    Marland Kitchen lidocaine-prilocaine (EMLA) cream Apply to port 1-2 hours before procedure 30 g 1  . lisinopril-hydrochlorothiazide (PRINZIDE,ZESTORETIC) 20-25 MG tablet TAKE 1 TABLET BY MOUTH DAILY 90 tablet 3  . LORazepam  (ATIVAN) 0.5 MG tablet Take 1 tablet (0.5 mg total) by mouth at bedtime as needed (Nausea or vomiting). 30 tablet 0  . metFORMIN (GLUCOPHAGE) 1000 MG tablet Take 1 tablet (1,000 mg total)  By mouth 2 (two) times daily with a meal 180 tablet 3  . nitroGLYCERIN (NITROSTAT) 0.4 MG SL tablet Place 1 tablet (0.4 mg total) under the tongue every 5 (five) minutes as needed for chest pain. 25 tablet 1  . prochlorperazine (COMPAZINE) 10 MG tablet Take 1 tablet (10 mg total) by mouth every 6 (six) hours as needed (Nausea or vomiting). 30 tablet 1  . Turmeric Curcumin 500 MG CAPS Take 1 capsule by mouth 2 (two) times daily.     No current facility-administered medications for this visit.    OBJECTIVE: Middle-aged white woman In no acute distress  Filed Vitals:   12/01/15 1050  BP: 132/52  Pulse: 101  Temp: 98 F (36.7 C)  Resp: 18     Body mass index is 30.25 kg/(m^2).    ECOG FS:1 - Symptomatic but completely ambulatory  Sclerae unicteric, EOMs intact Oropharynx clear and moist No cervical or supraclavicular adenopathy Lungs no rales or rhonchi Heart regular rate and rhythm  Abd soft, nontender, positive bowel sounds MSK no focal spinal tenderness, no upper extremity lymphedema Neuro: nonfocal, well oriented, appropriate affect Breasts: Deferred  LAB RESULTS:  CMP     Component Value Date/Time   NA 139 11/24/2015 0906   NA 138 11/22/2015 0845   K 3.8 11/24/2015 0906   K 3.6 11/22/2015 0845   CL 109 11/22/2015 0845   CO2 21* 11/24/2015 0906   CO2 19* 11/22/2015 0845   GLUCOSE 230* 11/24/2015 0906   GLUCOSE 146* 11/22/2015 0845   BUN 23.9 11/24/2015 0906   BUN 21* 11/22/2015 0845   CREATININE 1.4* 11/24/2015 0906   CREATININE 1.23* 11/22/2015 0845   CREATININE 1.34* 08/15/2015 1104   CALCIUM 9.9 11/24/2015 0906   CALCIUM 9.4 11/22/2015 0845   PROT 7.6 11/24/2015 0906   PROT 7.5 08/15/2015 1104   ALBUMIN 4.2 11/24/2015 0906   ALBUMIN 4.6 08/15/2015 1104   AST 23 11/24/2015  0906   AST 22 08/15/2015 1104   ALT 28 11/24/2015 0906   ALT 31* 08/15/2015 1104   ALKPHOS 52 11/24/2015 0906   ALKPHOS 55 08/15/2015 1104   BILITOT 0.68 11/24/2015 0906   BILITOT 0.5 08/15/2015 1104   GFRNONAA 46* 11/22/2015 0845   GFRNONAA 67 08/31/2013 1007   GFRAA 53* 11/22/2015 0845   GFRAA 77 08/31/2013 1007    INo results found for: SPEP, UPEP  Lab Results  Component Value Date   WBC 5.5 12/01/2015   NEUTROABS 4.1 12/01/2015   HGB 11.9 12/01/2015   HCT 36.0 12/01/2015   MCV 86.7 12/01/2015   PLT 127* 12/01/2015      Chemistry      Component Value Date/Time   NA 139 11/24/2015 0906   NA 138 11/22/2015 0845   K 3.8 11/24/2015 0906   K 3.6 11/22/2015 0845   CL 109 11/22/2015 0845   CO2 21* 11/24/2015 0906   CO2 19* 11/22/2015 0845   BUN 23.9 11/24/2015 0906   BUN 21* 11/22/2015 0845   CREATININE 1.4* 11/24/2015 0906   CREATININE 1.23* 11/22/2015 0845   CREATININE 1.34* 08/15/2015 1104      Component Value Date/Time   CALCIUM 9.9 11/24/2015 0906   CALCIUM 9.4 11/22/2015 0845   ALKPHOS 52 11/24/2015 0906   ALKPHOS 55 08/15/2015 1104   AST 23 11/24/2015 0906   AST 22 08/15/2015 1104   ALT 28 11/24/2015 0906   ALT 31* 08/15/2015 1104   BILITOT 0.68 11/24/2015 0906   BILITOT 0.5 08/15/2015 1104       No results found for: LABCA2  No components found for: [id number]  No results for input(s): INR in the last 168 hours.  Urinalysis    Component Value Date/Time   BILIRUBINUR n 12/13/2014 1000   PROTEINUR n 12/13/2014 1000   UROBILINOGEN negative 12/13/2014 1000   NITRITE n 12/13/2014 1000   LEUKOCYTESUR Negative 11/05/2011 1201     STUDIES: Ct Chest W Contrast  11/17/2015  CLINICAL DATA:  New diagnosis of right breast cancer. EXAM: CT CHEST, ABDOMEN, AND PELVIS WITH CONTRAST TECHNIQUE: Multidetector CT imaging of the chest, abdomen and pelvis was performed following the standard protocol during bolus administration of intravenous contrast. CONTRAST:   33m ISOVUE-300 IOPAMIDOL (ISOVUE-300) INJECTION 61% COMPARISON:  None. FINDINGS: CT CHEST FINDINGS Mediastinum/Lymph Nodes: The heart size appears normal. Aortic atherosclerosis is identified. Calcification in the RCA and LAD coronary artery is identified. The trachea appears patent and is midline. Normal appearance of the esophagus. Multiple enlarged right axillary lymph nodes  are identified. Index right axillary lymph node measures 2.4 cm, image 99 of series 2. Right retro pectoral node is enlarged measuring 1.4 cm, image 17 of series 2. No no enlarged supraclavicular adenopathy. No enlarged left axillary nodes. No enlarged internal mammary lymph nodes. Lungs/Pleura: No pleural fluid. There is no airspace consolidation or atelectasis identified. There is a small nodule within the superior segment of the left lower lobe measuring 4 mm, nonspecific, image number 63 of series 5. Musculoskeletal: No chest wall mass or suspicious bone lesions identified. CT ABDOMEN PELVIS FINDINGS Hepatobiliary: No suspicious liver abnormalities identified. Gallstones are identified within the dependent portion of the gallbladder. No biliary dilatation. There is no biliary dilatation. Pancreas: Normal appearance of the pancreas. Spleen: Normal appearance of the spleen. Adrenals/Urinary Tract: The adrenal glands are within normal limits. No kidney abnormality identified. The urinary bladder appears normal. Stomach/Bowel: The stomach is within normal limits. The small bowel loops have a normal course and caliber. No obstruction. Normal appearance of the colon. Vascular/Lymphatic: Calcified atherosclerotic disease involves the abdominal aorta. No aneurysm. No enlarged retroperitoneal or mesenteric adenopathy. No enlarged pelvic or inguinal lymph nodes. Reproductive: No mass or other significant abnormality. Other: There is no ascites or focal fluid collections within the abdomen or pelvis. Musculoskeletal: No aggressive lytic or  sclerotic bone lesions. There is degenerative disc disease noted within the lumbar spine. IMPRESSION: 1. Enlarged right axillary and retropectoral lymph nodes compatible with metastatic adenopathy. 2. No evidence for distant metastatic disease from patient's right breast cancer. 3. Aortic atherosclerosis and multi vessel coronary artery calcification 4. Small pulmonary nodule in the superior segment of left lower lobe measures 4 mm. 5. Gallstones. Electronically Signed   By: Kerby Moors M.D.   On: 11/17/2015 10:42   Nm Bone Scan Whole Body  11/17/2015  CLINICAL DATA:  Breast cancer. EXAM: NUCLEAR MEDICINE WHOLE BODY BONE SCAN TECHNIQUE: Whole body anterior and posterior images were obtained approximately 3 hours after intravenous injection of radiopharmaceutical. RADIOPHARMACEUTICALS:  26.9 MCi Technetium-35mMDP IV COMPARISON:  CT 11/17/2015. FINDINGS: Bilateral renal function and excretion. Minimal increased activity at the shoulders, sternoclavicular joints, hands most likely degenerative. No evidence of metastatic disease. IMPRESSION: No evidence of metastatic disease. Electronically Signed   By: TMarcello Moores Register   On: 11/17/2015 13:06   Ct Abdomen Pelvis W Contrast  11/17/2015  CLINICAL DATA:  New diagnosis of right breast cancer. EXAM: CT CHEST, ABDOMEN, AND PELVIS WITH CONTRAST TECHNIQUE: Multidetector CT imaging of the chest, abdomen and pelvis was performed following the standard protocol during bolus administration of intravenous contrast. CONTRAST:  863mISOVUE-300 IOPAMIDOL (ISOVUE-300) INJECTION 61% COMPARISON:  None. FINDINGS: CT CHEST FINDINGS Mediastinum/Lymph Nodes: The heart size appears normal. Aortic atherosclerosis is identified. Calcification in the RCA and LAD coronary artery is identified. The trachea appears patent and is midline. Normal appearance of the esophagus. Multiple enlarged right axillary lymph nodes are identified. Index right axillary lymph node measures 2.4 cm, image 99  of series 2. Right retro pectoral node is enlarged measuring 1.4 cm, image 17 of series 2. No no enlarged supraclavicular adenopathy. No enlarged left axillary nodes. No enlarged internal mammary lymph nodes. Lungs/Pleura: No pleural fluid. There is no airspace consolidation or atelectasis identified. There is a small nodule within the superior segment of the left lower lobe measuring 4 mm, nonspecific, image number 63 of series 5. Musculoskeletal: No chest wall mass or suspicious bone lesions identified. CT ABDOMEN PELVIS FINDINGS Hepatobiliary: No suspicious liver abnormalities identified. Gallstones are  identified within the dependent portion of the gallbladder. No biliary dilatation. There is no biliary dilatation. Pancreas: Normal appearance of the pancreas. Spleen: Normal appearance of the spleen. Adrenals/Urinary Tract: The adrenal glands are within normal limits. No kidney abnormality identified. The urinary bladder appears normal. Stomach/Bowel: The stomach is within normal limits. The small bowel loops have a normal course and caliber. No obstruction. Normal appearance of the colon. Vascular/Lymphatic: Calcified atherosclerotic disease involves the abdominal aorta. No aneurysm. No enlarged retroperitoneal or mesenteric adenopathy. No enlarged pelvic or inguinal lymph nodes. Reproductive: No mass or other significant abnormality. Other: There is no ascites or focal fluid collections within the abdomen or pelvis. Musculoskeletal: No aggressive lytic or sclerotic bone lesions. There is degenerative disc disease noted within the lumbar spine. IMPRESSION: 1. Enlarged right axillary and retropectoral lymph nodes compatible with metastatic adenopathy. 2. No evidence for distant metastatic disease from patient's right breast cancer. 3. Aortic atherosclerosis and multi vessel coronary artery calcification 4. Small pulmonary nodule in the superior segment of left lower lobe measures 4 mm. 5. Gallstones.  Electronically Signed   By: Kerby Moors M.D.   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 LABS:  GFR 38. EXAM: BILATERAL BREAST MRI WITH AND WITHOUT CONTRAST TECHNIQUE: Multiplanar, multisequence MR images of both breasts were obtained prior to and following the intravenous administration of 8 ml of MultiHance. THREE-DIMENSIONAL MR IMAGE RENDERING ON INDEPENDENT WORKSTATION: Three-dimensional MR images were rendered by post-processing of the original MR data on an independent workstation. The three-dimensional MR images were interpreted, and findings are reported in the following complete MRI report for this study. Three dimensional images were evaluated at the independent DynaCad workstation COMPARISON:  Mammograms 10/25/2015 and priors. FINDINGS: Breast composition: b. Scattered fibroglandular tissue. Background parenchymal enhancement: Moderate. Right breast: No mass or abnormal enhancement. Numerous 1-2 mm progressively enhancing foci are seen throughout the breast, demonstrating enhancement below threshold for abnormal enhancement. Left breast: No mass or abnormal enhancement. Lymph nodes: There are 6 enlarged grossly abnormal lymph nodes in the right axilla. Ancillary findings:  None. IMPRESSION: No MRI evidence of malignancy in either breast. Marked right axillary lymphadenopathy. No evidence of left axillary lymphadenopathy. RECOMMENDATION: Clinical follow-up for biopsy-proven malignant right axillary lymphadenopathy. BI-RADS CATEGORY  6: Known biopsy-proven malignancy. Electronically Signed   By: Fidela Salisbury M.D.   On: 11/07/2015 15:29   Ir Fluoro Guide Cv Line Left  11/22/2015  INDICATION: 65 year old female with a history of right-sided breast carcinoma. She tells me she will have chemotherapy for 3-4 months of cycling, then with surgery at with  future radiation therapy. Given the interval time for healing, right-sided port is reasonable. EXAM: IMPLANTED PORT A CATH PLACEMENT WITH ULTRASOUND AND FLUOROSCOPIC GUIDANCE MEDICATIONS: 2.0 g Ancef; The antibiotic was administered within an appropriate time interval prior to skin puncture. ANESTHESIA/SEDATION: Moderate (conscious) sedation was employed during this procedure. A total of Versed 1.5 mg and Fentanyl 75 mcg was administered intravenously. Moderate Sedation Time: 19 minutes. The patient's level of consciousness and vital signs were monitored continuously by radiology nursing throughout the procedure under my direct supervision. FLUOROSCOPY TIME:  Zero minutes, 12 seconds (1 mGy) COMPLICATIONS: None PROCEDURE: The procedure, risks, benefits, and alternatives were explained to the patient. Questions regarding the procedure were encouraged and answered. The patient understands and consents to the procedure. Ultrasound survey was performed with images  stored and sent to PACs. The right neck and chest was prepped with chlorhexidine, and draped in the usual sterile fashion using maximum barrier technique (cap and mask, sterile gown, sterile gloves, large sterile sheet, hand hygiene and cutaneous antiseptic). Antibiotic prophylaxis was provided with 2.0g Ancef administered IV one hour prior to skin incision. Local anesthesia was attained by infiltration with 1% lidocaine without epinephrine. Ultrasound demonstrated patency of the right internal jugular vein, and this was documented with an image. Under real-time ultrasound guidance, this vein was accessed with a 21 gauge micropuncture needle and image documentation was performed. A small dermatotomy was made at the access site with an 11 scalpel. A 0.018" wire was advanced into the SVC and used to estimate the length of the internal catheter. The access needle exchanged for a 6F micropuncture vascular sheath. The 0.018" wire was then removed and a 0.035" wire  advanced into the IVC. An appropriate location for the subcutaneous reservoir was selected below the clavicle and an incision was made through the skin and underlying soft tissues. The subcutaneous tissues were then dissected using a combination of blunt and sharp surgical technique and a pocket was formed. A single lumen power injectable portacatheter was then tunneled through the subcutaneous tissues from the pocket to the dermatotomy and the port reservoir placed within the subcutaneous pocket. The venous access site was then serially dilated and a peel away vascular sheath placed over the wire. The wire was removed and the port catheter advanced into position under fluoroscopic guidance. The catheter tip is positioned in the cavoatrial junction. This was documented with a spot image. The portacatheter was then tested and found to flush and aspirate well. The port was flushed with saline followed by 100 units/mL heparinized saline. The pocket was then closed in two layers using first subdermal inverted interrupted absorbable sutures followed by a running subcuticular suture. The epidermis was then sealed with Dermabond. The dermatotomy at the venous access site was also seal with Dermabond. Patient tolerated the procedure well and remained hemodynamically stable throughout. No complications encountered and no significant blood loss encountered IMPRESSION: Status post right IJ port catheter placement. Catheter ready for use. Signed, Dulcy Fanny. Earleen Newport, DO [facility] Electronically Signed   By: Corrie Mckusick D.O.   On: 11/22/2015 16:17   Ir US Guide Vasc Access Right  11/22/2015  INDICATION: 65 year old female with a history of right-sided breast carcinoma. She tells me she will have chemotherapy for 3-4 months of cycling, then with surgery at with future radiation therapy. Given the interval time for healing, right-sided port is reasonable. EXAM:  IMPLANTED PORT A CATH PLACEMENT WITH ULTRASOUND AND FLUOROSCOPIC GUIDANCE MEDICATIONS: 2.0 g Ancef; The antibiotic was administered within an appropriate time interval prior to skin puncture. ANESTHESIA/SEDATION: Moderate (conscious) sedation was employed during this procedure. A total of Versed 1.5 mg and Fentanyl 75 mcg was administered intravenously. Moderate Sedation Time: 19 minutes. The patient's level of consciousness and vital signs were monitored continuously by radiology nursing throughout the procedure under my direct supervision. FLUOROSCOPY TIME:  Zero minutes, 12 seconds (1 mGy) COMPLICATIONS: None PROCEDURE: The procedure, risks, benefits, and alternatives were explained to the patient. Questions regarding the procedure were encouraged and answered. The patient understands and consents to the procedure. Ultrasound survey was performed with images stored and sent to PACs. The right neck and chest was prepped with chlorhexidine, and draped in the usual sterile fashion using maximum barrier technique (cap and mask, sterile  gown, sterile gloves, large sterile sheet, hand hygiene and cutaneous antiseptic). Antibiotic prophylaxis was provided with 2.0g Ancef administered IV one hour prior to skin incision. Local anesthesia was attained by infiltration with 1% lidocaine without epinephrine. Ultrasound demonstrated patency of the right internal jugular vein, and this was documented with an image. Under real-time ultrasound guidance, this vein was accessed with a 21 gauge micropuncture needle and image documentation was performed. A small dermatotomy was made at the access site with an 11 scalpel. A 0.018" wire was advanced into the SVC and used to estimate the length of the internal catheter. The access needle exchanged for a 40F micropuncture vascular sheath. The 0.018" wire was then removed and a 0.035" wire advanced into the IVC. An appropriate location for the subcutaneous reservoir was selected below the  clavicle and an incision was made through the skin and underlying soft tissues. The subcutaneous tissues were then dissected using a combination of blunt and sharp surgical technique and a pocket was formed. A single lumen power injectable portacatheter was then tunneled through the subcutaneous tissues from the pocket to the dermatotomy and the port reservoir placed within the subcutaneous pocket. The venous access site was then serially dilated and a peel away vascular sheath placed over the wire. The wire was removed and the port catheter advanced into position under fluoroscopic guidance. The catheter tip is positioned in the cavoatrial junction. This was documented with a spot image. The portacatheter was then tested and found to flush and aspirate well. The port was flushed with saline followed by 100 units/mL heparinized saline. The pocket was then closed in two layers using first subdermal inverted interrupted absorbable sutures followed by a running subcuticular suture. The epidermis was then sealed with Dermabond. The dermatotomy at the venous access site was also seal with Dermabond. Patient tolerated the procedure well and remained hemodynamically stable throughout. No complications encountered and no significant blood loss encountered IMPRESSION: Status post right IJ port catheter placement. Catheter ready for use. Signed, Dulcy Fanny. Earleen Newport, DO [facility] Electronically Signed   By: Corrie Mckusick D.O.   On: 11/22/2015 16:17    ELIGIBLE FOR AVAILABLE RESEARCH PROTOCOL: no  ASSESSMENT: 65 y.o. [place] woman status post right breast axillary tail lymph node biopsy 10/25/2015 for a clinical  TX N2, stage IIIA invasive ductal carcinoma, estrogen and progesterone receptor positive, HER-2 nonamplified   (1) neoadjuvant chemotherapy started 11/23/2015 consisting of cyclophosphamide and docetaxel in dose dense fashion 4,  to be followed by  weekly paclitaxel 12  (2) axillary lymph node dissection to follow chemotherapy  (3) adjuvant radiation to follow surgery  (4) anti-estrogens to follow at the completion of local treatment  (5) genetics testing pending    PLAN: Taylar did remarkably well with her first cycle of chemotherapy, to the point that she was surprised and now is worried that things may not stay the same. I reassured her that usually cycles to 3 and 4 are similar to the first, except that her counts are likely to drop more and she may be more fatigued.  Accordingly I am not anticipating any significant changes to her treatment. I will see her with day 1 of cycle 2, a week from today, and possibly she can skip the day 8 visits if she continues to do as well as she is doing now.  I reassured her that she be headache she had on the day of treatment is due to her premeds and  it will occur every time. This is usually well-controlled with Tylenol and she will take Tylenol as soon as she gets home on day 1  She did a good job at staying well-hydrated and I encouraged her to continue that  She knows to call for any problems that may develop before her next visit here. He Chauncey Cruel, MD   12/01/2015 11:03 AM Medical Oncology and Hematology [facility] [address] [place], [place] [address] Tel. [phone]    Fax. [phone]

## 2015-12-01 NOTE — Patient Instructions (Signed)

## 2015-12-04 ENCOUNTER — Ambulatory Visit: Payer: BLUE CROSS/BLUE SHIELD | Admitting: Physical Therapy

## 2015-12-06 ENCOUNTER — Ambulatory Visit: Payer: BLUE CROSS/BLUE SHIELD

## 2015-12-07 ENCOUNTER — Other Ambulatory Visit: Payer: Self-pay | Admitting: Oncology

## 2015-12-07 ENCOUNTER — Other Ambulatory Visit: Payer: Self-pay | Admitting: *Deleted

## 2015-12-07 DIAGNOSIS — C773 Secondary and unspecified malignant neoplasm of axilla and upper limb lymph nodes: Principal | ICD-10-CM

## 2015-12-07 DIAGNOSIS — C50911 Malignant neoplasm of unspecified site of right female breast: Secondary | ICD-10-CM

## 2015-12-07 DIAGNOSIS — C50611 Malignant neoplasm of axillary tail of right female breast: Secondary | ICD-10-CM

## 2015-12-08 ENCOUNTER — Ambulatory Visit (HOSPITAL_BASED_OUTPATIENT_CLINIC_OR_DEPARTMENT_OTHER): Payer: BLUE CROSS/BLUE SHIELD

## 2015-12-08 ENCOUNTER — Other Ambulatory Visit: Payer: Self-pay | Admitting: Oncology

## 2015-12-08 ENCOUNTER — Ambulatory Visit (HOSPITAL_BASED_OUTPATIENT_CLINIC_OR_DEPARTMENT_OTHER): Payer: BLUE CROSS/BLUE SHIELD | Admitting: Oncology

## 2015-12-08 ENCOUNTER — Encounter: Payer: Self-pay | Admitting: *Deleted

## 2015-12-08 ENCOUNTER — Other Ambulatory Visit (HOSPITAL_BASED_OUTPATIENT_CLINIC_OR_DEPARTMENT_OTHER): Payer: BLUE CROSS/BLUE SHIELD

## 2015-12-08 VITALS — BP 140/62 | HR 90 | Temp 98.0°F | Resp 18 | Wt 172.5 lb

## 2015-12-08 DIAGNOSIS — C773 Secondary and unspecified malignant neoplasm of axilla and upper limb lymph nodes: Principal | ICD-10-CM

## 2015-12-08 DIAGNOSIS — C50611 Malignant neoplasm of axillary tail of right female breast: Secondary | ICD-10-CM

## 2015-12-08 DIAGNOSIS — Z170 Estrogen receptor positive status [ER+]: Secondary | ICD-10-CM | POA: Diagnosis not present

## 2015-12-08 DIAGNOSIS — Z5189 Encounter for other specified aftercare: Secondary | ICD-10-CM | POA: Diagnosis not present

## 2015-12-08 DIAGNOSIS — C50911 Malignant neoplasm of unspecified site of right female breast: Secondary | ICD-10-CM

## 2015-12-08 DIAGNOSIS — Z5111 Encounter for antineoplastic chemotherapy: Secondary | ICD-10-CM | POA: Diagnosis not present

## 2015-12-08 LAB — COMPREHENSIVE METABOLIC PANEL
ALT: 24 U/L (ref 0–55)
AST: 16 U/L (ref 5–34)
Albumin: 4 g/dL (ref 3.5–5.0)
Alkaline Phosphatase: 81 U/L (ref 40–150)
Anion Gap: 11 mEq/L (ref 3–11)
BILIRUBIN TOTAL: 0.44 mg/dL (ref 0.20–1.20)
BUN: 19 mg/dL (ref 7.0–26.0)
CO2: 23 meq/L (ref 22–29)
CREATININE: 1.2 mg/dL — AB (ref 0.6–1.1)
Calcium: 9.6 mg/dL (ref 8.4–10.4)
Chloride: 102 mEq/L (ref 98–109)
EGFR: 46 mL/min/{1.73_m2} — ABNORMAL LOW (ref >90)
Glucose: 225 mg/dl — ABNORMAL HIGH (ref 70–140)
Potassium: 4.1 mEq/L (ref 3.5–5.1)
Sodium: 137 mEq/L (ref 136–145)
TOTAL PROTEIN: 7.1 g/dL (ref 6.4–8.3)

## 2015-12-08 LAB — CBC WITH DIFFERENTIAL/PLATELET
BASO%: 0.7 % (ref 0.0–2.0)
Basophils Absolute: 0.1 10*3/uL (ref 0.0–0.1)
EOS%: 0.1 % (ref 0.0–7.0)
Eosinophils Absolute: 0 10*3/uL (ref 0.0–0.5)
HEMATOCRIT: 34 % — AB (ref 34.8–46.6)
HEMOGLOBIN: 11.3 g/dL — AB (ref 11.6–15.9)
LYMPH#: 0.9 10*3/uL (ref 0.9–3.3)
LYMPH%: 10.3 % — ABNORMAL LOW (ref 14.0–49.7)
MCH: 28.5 pg (ref 25.1–34.0)
MCHC: 33.1 g/dL (ref 31.5–36.0)
MCV: 86.1 fL (ref 79.5–101.0)
MONO#: 0.4 10*3/uL (ref 0.1–0.9)
MONO%: 4.9 % (ref 0.0–14.0)
NEUT#: 7.2 10*3/uL — ABNORMAL HIGH (ref 1.5–6.5)
NEUT%: 84 % — ABNORMAL HIGH (ref 38.4–76.8)
Platelets: 168 10*3/uL (ref 145–400)
RBC: 3.95 10*6/uL (ref 3.70–5.45)
RDW: 13.3 % (ref 11.2–14.5)
WBC: 8.6 10*3/uL (ref 3.9–10.3)

## 2015-12-08 MED ORDER — SODIUM CHLORIDE 0.9% FLUSH
10.0000 mL | INTRAVENOUS | Status: DC | PRN
  Administered 2015-12-08: 10 mL
  Filled 2015-12-08: qty 10

## 2015-12-08 MED ORDER — HEPARIN SOD (PORK) LOCK FLUSH 100 UNIT/ML IV SOLN
500.0000 [IU] | Freq: Once | INTRAVENOUS | Status: AC | PRN
  Administered 2015-12-08: 500 [IU]
  Filled 2015-12-08: qty 5

## 2015-12-08 MED ORDER — DOXORUBICIN HCL CHEMO IV INJECTION 2 MG/ML
60.0000 mg/m2 | Freq: Once | INTRAVENOUS | Status: AC
  Administered 2015-12-08: 114 mg via INTRAVENOUS
  Filled 2015-12-08: qty 57

## 2015-12-08 MED ORDER — SODIUM CHLORIDE 0.9 % IV SOLN
Freq: Once | INTRAVENOUS | Status: AC
  Administered 2015-12-08: 10:00:00 via INTRAVENOUS

## 2015-12-08 MED ORDER — PALONOSETRON HCL INJECTION 0.25 MG/5ML
0.2500 mg | Freq: Once | INTRAVENOUS | Status: AC
  Administered 2015-12-08: 0.25 mg via INTRAVENOUS

## 2015-12-08 MED ORDER — SODIUM CHLORIDE 0.9 % IV SOLN
600.0000 mg/m2 | Freq: Once | INTRAVENOUS | Status: AC
  Administered 2015-12-08: 1140 mg via INTRAVENOUS
  Filled 2015-12-08: qty 57

## 2015-12-08 MED ORDER — PALONOSETRON HCL INJECTION 0.25 MG/5ML
INTRAVENOUS | Status: AC
  Filled 2015-12-08: qty 5

## 2015-12-08 MED ORDER — SODIUM CHLORIDE 0.9 % IV SOLN
Freq: Once | INTRAVENOUS | Status: AC
  Administered 2015-12-08: 10:00:00 via INTRAVENOUS
  Filled 2015-12-08: qty 5

## 2015-12-08 MED ORDER — PEGFILGRASTIM 6 MG/0.6ML ~~LOC~~ PSKT
6.0000 mg | PREFILLED_SYRINGE | Freq: Once | SUBCUTANEOUS | Status: AC
  Administered 2015-12-08: 6 mg via SUBCUTANEOUS
  Filled 2015-12-08: qty 0.6

## 2015-12-08 NOTE — Progress Notes (Signed)
[place]  Telephone:([phone]) [phone] Fax:(336) [phone]     ID: KANESHA CADLE DOB: 1951-05-02  MR#: [id number]  JYN#:[id number]  Patient Care Team: Harrison Mons, PA-C as PCP - General (Physician Assistant) Jari Pigg, MD as Attending Physician (Dermatology) Kem Boroughs, FNP as Nurse Practitioner (Obstetrics and Gynecology) Adrian Prows, MD as Consulting Physician (Cardiology) Erroll Luna, MD as Consulting Physician (General Surgery) Chauncey Cruel, MD as Consulting Physician (Oncology) Eppie Gibson, MD as Attending Physician (Radiation Oncology) PCP: Harrison Mons, PA-C Chauncey Cruel, MD OTHER MD:  CHIEF COMPLAINT: Estrogen receptor positive breast cancer  CURRENT TREATMENT: Neoadjuvant chemotherapy   BREAST CANCER HISTORY: From the original intake note:  "Gina Navarro" noted a mass in her right axilla 10/20/2015 and brought it to medical attention. She was scheduled for bilateral diagnostic mammography with tomography at [facility] 10/24/2015. The breast composition was category B. Mammogram of the breast showed no suspicious masses. However there was palpable right axillary adenopathy, more specifically a 5 cm "lump beneath the anterior right axilla, and ultrasound was obtained.. On ultrasound, the breast itself was unremarkable. However there were multiple enlarged right axillary lymph nodes the largest measuring 5.7 cm.   Biopsy of one of the enlarged right axillary lymph nodes on 10/25/2015 showed metastatic carcinoma, which was cytokeratin 7 positive, estrogen receptor 95% positive, progesterone receptor 10% positive, both with strong staining intensity, and HER-2 nonamplified, with a signals ratio 1.62, and the number percell 2.10.  MRI of the breasts 11/06/2015 again showed no mass or abnormal enhancement in the right breast or left breast. In the right axilla there were 6 enlarged grossly abnormal lymph nodes.  The patient's subsequent history is as detailed  below  INTERVAL HISTORY: Gina Navarro returns today for continuing neoadjuvant treatment of her estrogen receptor positive breast cancer, accompanied by a friend Today is day 1 cycle 2 of 4 planned cycles of cyclophosphamide and doxorubicin, to be followed by weekly paclitaxel 12.  REVIEW OF SYSTEMS: Gina Navarro had diarrhea approximately day 10 of cycle 1. This was 3-4 liquid movements each day. It lasted a day and a half. She didn't treat it, she simply stopped her MiraLAX and stool softeners and the problem resolved without intervention. She does not have fever, aches and pains, or any other symptoms associated with this. She has had no mouth sores, no nausea or vomiting, and good energy. Her port is working well. A detailed review of systems today was otherwise noncontributory  PAST MEDICAL HISTORY: Past Medical History:  Diagnosis Date  . Arthritis   . 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 Diabetes mellitus without complication ([place]) 8657   oral meds until Victoza in 07/2012, now change to Invokana  . Hypertension 2006  . Menopausal state   . Skin cancer    [place]  PAST SURGICAL HISTORY: Past Surgical History:  Procedure Laterality Date  . MOHS SURGERY  2002   chin x 1 and right forehead X 1 in 2004  . TONSILLECTOMY AND ADENOIDECTOMY      FAMILY HISTORY Family History  Problem Relation Age of Onset  . Adopted: Yes  . Liver cancer Mother   . Lung cancer Mother   . Brain cancer Father   The patient is adopted and knows little about her biological family. Both are adopted parents have died. Her adopted Brother (not biologically related) Lowry Ram, lives in [place].  GYNECOLOGIC HISTORY:  Patient's last menstrual period was 05/13/1996. Menarche age 17, the patient is [place]  P0. She went through menopause in her early 15s and did not take hormone replacement. She never used oral contraceptives.  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 She is contracted by group who contracts to the state. She is single and lives alone with 4 dogs. She has a large fasting yard. She attends the [employer]    ADVANCED DIRECTIVES: The patient completed a healthcare part of attorney document and a copy was filed to be scanned 12/01/2015. She has named [employer] representative as her healthcare power of attorney. She can be reached at [place] [employer]: Social History  Substance Use Topics  . Smoking status: Former Smoker    Packs/day: 1.00    Years: 5.00    Types: Cigarettes  . Smokeless tobacco: Never Used  . Alcohol use No     Colonoscopy: Never  PAP:  Bone density: Never   Allergies  Allergen Reactions  . Bee Venom   . Statins Other (See Comments)    MYALGIAS with Crestor, Zocor. Tolerating Lipitor (05/2013)  . Victoza [Liraglutide]     Dizziness, peripheral neuropathy, constipation, heart palpitations    Current Outpatient Prescriptions  Medication Sig Dispense Refill  . canagliflozin (INVOKANA) 300 MG TABS tablet Take 1 tablet (300 mg total) by mouth daily before breakfast. 90 tablet 3  . cholecalciferol (VITAMIN D) 1000 UNITS tablet Take 1,000 Units by mouth daily.    Marland Kitchen dexamethasone (DECADRON) 4 MG tablet Take 2 tablets by mouth once a day on the day after chemotherapy and then take 2 tablets two times a day for 2 days. Take with food. 30 tablet 1  . glucosamine-chondroitin 500-400 MG tablet Take 1 tablet by mouth 3 (three) times daily.    Marland Kitchen lidocaine-prilocaine (EMLA) cream Apply to port 1-2 hours before procedure 30 g 1  . lisinopril-hydrochlorothiazide (PRINZIDE,ZESTORETIC) 20-25 MG tablet TAKE 1 TABLET BY MOUTH DAILY 90 tablet 3  . LORazepam (ATIVAN) 0.5 MG tablet Take 1 tablet (0.5 mg total) by mouth at bedtime as needed (Nausea or vomiting). 30 tablet 0  . metFORMIN  (GLUCOPHAGE) 1000 MG tablet Take 1 tablet (1,000 mg total)  By mouth 2 (two) times daily with a meal 180 tablet 3  . nitroGLYCERIN (NITROSTAT) 0.4 MG SL tablet Place 1 tablet (0.4 mg total) under the tongue every 5 (five) minutes as needed for chest pain. 25 tablet 1  . prochlorperazine (COMPAZINE) 10 MG tablet Take 1 tablet (10 mg total) by mouth every 6 (six) hours as needed (Nausea or vomiting). 30 tablet 1   No current facility-administered medications for this visit.    Facility-Administered Medications Ordered in Other Visits  Medication Dose Route Frequency Provider Last Rate Last Dose  . 0.9 %  sodium chloride infusion   Intravenous Once Chauncey Cruel, MD      . cyclophosphamide (CYTOXAN) 1,140 mg in sodium chloride 0.9 % 250 mL chemo infusion  600 mg/m2 (Treatment Plan Recorded) Intravenous Once Chauncey Cruel, MD      . DOXOrubicin (ADRIAMYCIN) chemo injection 114 mg  60 mg/m2 (Treatment Plan Recorded) Intravenous Once Chauncey Cruel, MD      . fosaprepitant (EMEND) 150 mg, dexamethasone (DECADRON) 12 mg in sodium chloride 0.9 % 145 mL IVPB   Intravenous Once Chauncey Cruel, MD      . heparin lock flush 100  unit/mL  500 Units Intracatheter Once PRN Chauncey Cruel, MD      . palonosetron (ALOXI) injection 0.25 mg  0.25 mg Intravenous Once Chauncey Cruel, MD      . pegfilgrastim (NEULASTA ONPRO KIT) injection 6 mg  6 mg Subcutaneous Once Chauncey Cruel, MD      . sodium chloride flush (NS) 0.9 % injection 10 mL  10 mL Intracatheter PRN Chauncey Cruel, MD        OBJECTIVE: Middle-aged white woman In no acute distress  There were no vitals filed for this visit.   There is no height or weight on file to calculate BMI.    ECOG FS:1 - Symptomatic but completely ambulatory Pulse is 90, respiratory rate 18, and temperature 98.0. Weight is stable at 172.  Sclerae unicteric, pupils round and equal Oropharynx clear and moist-- no thrush or other lesions No cervical or  supraclavicular adenopathy Lungs no rales or rhonchi Heart regular rate and rhythm Abd soft, nontender, positive bowel sounds MSK no focal spinal tenderness, no upper extremity lymphedema Neuro: nonfocal, well oriented, appropriate affect Breasts: Deferred    LAB RESULTS:  CMP     Component Value Date/Time   NA 137 12/08/2015 0819   K 4.1 12/08/2015 0819   CL 109 11/22/2015 0845   CO2 23 12/08/2015 0819   GLUCOSE 225 (H) 12/08/2015 0819   BUN 19.0 12/08/2015 0819   CREATININE 1.2 (H) 12/08/2015 0819   CALCIUM 9.6 12/08/2015 0819   PROT 7.1 12/08/2015 0819   ALBUMIN 4.0 12/08/2015 0819   AST 16 12/08/2015 0819   ALT 24 12/08/2015 0819   ALKPHOS 81 12/08/2015 0819   BILITOT 0.44 12/08/2015 0819   GFRNONAA 46 (L) 11/22/2015 0845   GFRNONAA 67 08/31/2013 1007   GFRAA 53 (L) 11/22/2015 0845   GFRAA 77 08/31/2013 1007    INo results found for: SPEP, UPEP  Lab Results  Component Value Date   WBC 8.6 12/08/2015   NEUTROABS 7.2 (H) 12/08/2015   HGB 11.3 (L) 12/08/2015   HCT 34.0 (L) 12/08/2015   MCV 86.1 12/08/2015   PLT 168 12/08/2015      Chemistry      Component Value Date/Time   NA 137 12/08/2015 0819   K 4.1 12/08/2015 0819   CL 109 11/22/2015 0845   CO2 23 12/08/2015 0819   BUN 19.0 12/08/2015 0819   CREATININE 1.2 (H) 12/08/2015 0819      Component Value Date/Time   CALCIUM 9.6 12/08/2015 0819   ALKPHOS 81 12/08/2015 0819   AST 16 12/08/2015 0819   ALT 24 12/08/2015 0819   BILITOT 0.44 12/08/2015 0819       No results found for: LABCA2  No components found for: [id number]  No results for input(s): INR in the last 168 hours.  Urinalysis    Component Value Date/Time   BILIRUBINUR n 12/13/2014 1000   PROTEINUR n 12/13/2014 1000   UROBILINOGEN negative 12/13/2014 1000   NITRITE n 12/13/2014 1000   LEUKOCYTESUR Negative 11/05/2011 1201     STUDIES: Ct Chest W Contrast  Result Date: 11/17/2015 CLINICAL DATA:  New diagnosis of right breast  cancer. EXAM: CT CHEST, ABDOMEN, AND PELVIS WITH CONTRAST TECHNIQUE: Multidetector CT imaging of the chest, abdomen and pelvis was performed following the standard protocol during bolus administration of intravenous contrast. CONTRAST:  33m ISOVUE-300 IOPAMIDOL (ISOVUE-300) INJECTION 61% COMPARISON:  None. FINDINGS: CT CHEST FINDINGS Mediastinum/Lymph Nodes: The heart size appears normal. Aortic atherosclerosis  is identified. Calcification in the RCA and LAD coronary artery is identified. The trachea appears patent and is midline. Normal appearance of the esophagus. Multiple enlarged right axillary lymph nodes are identified. Index right axillary lymph node measures 2.4 cm, image 99 of series 2. Right retro pectoral node is enlarged measuring 1.4 cm, image 17 of series 2. No no enlarged supraclavicular adenopathy. No enlarged left axillary nodes. No enlarged internal mammary lymph nodes. Lungs/Pleura: No pleural fluid. There is no airspace consolidation or atelectasis identified. There is a small nodule within the superior segment of the left lower lobe measuring 4 mm, nonspecific, image number 63 of series 5. Musculoskeletal: No chest wall mass or suspicious bone lesions identified. CT ABDOMEN PELVIS FINDINGS Hepatobiliary: No suspicious liver abnormalities identified. Gallstones are identified within the dependent portion of the gallbladder. No biliary dilatation. There is no biliary dilatation. Pancreas: Normal appearance of the pancreas. Spleen: Normal appearance of the spleen. Adrenals/Urinary Tract: The adrenal glands are within normal limits. No kidney abnormality identified. The urinary bladder appears normal. Stomach/Bowel: The stomach is within normal limits. The small bowel loops have a normal course and caliber. No obstruction. Normal appearance of the colon. Vascular/Lymphatic: Calcified atherosclerotic disease involves the abdominal aorta. No aneurysm. No enlarged retroperitoneal or mesenteric  adenopathy. No enlarged pelvic or inguinal lymph nodes. Reproductive: No mass or other significant abnormality. Other: There is no ascites or focal fluid collections within the abdomen or pelvis. Musculoskeletal: No aggressive lytic or sclerotic bone lesions. There is degenerative disc disease noted within the lumbar spine. IMPRESSION: 1. Enlarged right axillary and retropectoral lymph nodes compatible with metastatic adenopathy. 2. No evidence for distant metastatic disease from patient's right breast cancer. 3. Aortic atherosclerosis and multi vessel coronary artery calcification 4. Small pulmonary nodule in the superior segment of left lower lobe measures 4 mm. 5. Gallstones. Electronically Signed   By: Kerby Moors M.D.   On: 11/17/2015 10:42   Nm Bone Scan Whole Body  Result Date: 11/17/2015 CLINICAL DATA:  Breast cancer. EXAM: NUCLEAR MEDICINE WHOLE BODY BONE SCAN TECHNIQUE: Whole body anterior and posterior images were obtained approximately 3 hours after intravenous injection of radiopharmaceutical. RADIOPHARMACEUTICALS:  26.9 MCi Technetium-23mMDP IV COMPARISON:  CT 11/17/2015. FINDINGS: Bilateral renal function and excretion. Minimal increased activity at the shoulders, sternoclavicular joints, hands most likely degenerative. No evidence of metastatic disease. IMPRESSION: No evidence of metastatic disease. Electronically Signed   By: TMarcello Moores Register   On: 11/17/2015 13:06   Ct Abdomen Pelvis W Contrast  Result Date: 11/17/2015 CLINICAL DATA:  New diagnosis of right breast cancer. EXAM: CT CHEST, ABDOMEN, AND PELVIS WITH CONTRAST TECHNIQUE: Multidetector CT imaging of the chest, abdomen and pelvis was performed following the standard protocol during bolus administration of intravenous contrast. CONTRAST:  847mISOVUE-300 IOPAMIDOL (ISOVUE-300) INJECTION 61% COMPARISON:  None. FINDINGS: CT CHEST FINDINGS Mediastinum/Lymph Nodes: The heart size appears normal. Aortic atherosclerosis is identified.  Calcification in the RCA and LAD coronary artery is identified. The trachea appears patent and is midline. Normal appearance of the esophagus. Multiple enlarged right axillary lymph nodes are identified. Index right axillary lymph node measures 2.4 cm, image 99 of series 2. Right retro pectoral node is enlarged measuring 1.4 cm, image 17 of series 2. No no enlarged supraclavicular adenopathy. No enlarged left axillary nodes. No enlarged internal mammary lymph nodes. Lungs/Pleura: No pleural fluid. There is no airspace consolidation or atelectasis identified. There is a small nodule within the superior segment of the left lower lobe  measuring 4 mm, nonspecific, image number 63 of series 5. Musculoskeletal: No chest wall mass or suspicious bone lesions identified. CT ABDOMEN PELVIS FINDINGS Hepatobiliary: No suspicious liver abnormalities identified. Gallstones are identified within the dependent portion of the gallbladder. No biliary dilatation. There is no biliary dilatation. Pancreas: Normal appearance of the pancreas. Spleen: Normal appearance of the spleen. Adrenals/Urinary Tract: The adrenal glands are within normal limits. No kidney abnormality identified. The urinary bladder appears normal. Stomach/Bowel: The stomach is within normal limits. The small bowel loops have a normal course and caliber. No obstruction. Normal appearance of the colon. Vascular/Lymphatic: Calcified atherosclerotic disease involves the abdominal aorta. No aneurysm. No enlarged retroperitoneal or mesenteric adenopathy. No enlarged pelvic or inguinal lymph nodes. Reproductive: No mass or other significant abnormality. Other: There is no ascites or focal fluid collections within the abdomen or pelvis. Musculoskeletal: No aggressive lytic or sclerotic bone lesions. There is degenerative disc disease noted within the lumbar spine. IMPRESSION: 1. Enlarged right axillary and retropectoral lymph nodes compatible with metastatic adenopathy. 2.  No evidence for distant metastatic disease from patient's right breast cancer. 3. Aortic atherosclerosis and multi vessel coronary artery calcification 4. Small pulmonary nodule in the superior segment of left lower lobe measures 4 mm. 5. Gallstones. Electronically Signed   By: Kerby Moors M.D.   On: 11/17/2015 10:42   Ir Fluoro Guide Cv Line Left  Result Date: 11/22/2015 INDICATION: 65 year old female with a history of right-sided breast carcinoma. She tells me she will have chemotherapy for 3-4 months of cycling, then with surgery at with future radiation therapy. Given the interval time for healing, right-sided port is reasonable. EXAM: IMPLANTED PORT A CATH PLACEMENT WITH ULTRASOUND AND FLUOROSCOPIC GUIDANCE MEDICATIONS: 2.0 g Ancef; The antibiotic was administered within an appropriate time interval prior to skin puncture. ANESTHESIA/SEDATION: Moderate (conscious) sedation was employed during this procedure. A total of Versed 1.5 mg and Fentanyl 75 mcg was administered intravenously. Moderate Sedation Time: 19 minutes. The patient's level of consciousness and vital signs were monitored continuously by radiology nursing throughout the procedure under my direct supervision. FLUOROSCOPY TIME:  Zero minutes, 12 seconds (1 mGy) COMPLICATIONS: None PROCEDURE: The procedure, risks, benefits, and alternatives were explained to the patient. Questions regarding the procedure were encouraged and answered. The patient understands and consents to the procedure. Ultrasound survey was performed with images stored and sent to PACs. The right neck and chest was prepped with chlorhexidine, and draped in the usual sterile fashion using maximum barrier technique (cap and mask, sterile gown, sterile gloves, large sterile sheet, hand hygiene and cutaneous antiseptic). Antibiotic prophylaxis was provided with 2.0g Ancef administered IV one hour prior to skin incision. Local anesthesia was attained by infiltration with 1%  lidocaine without epinephrine. Ultrasound demonstrated patency of the right internal jugular vein, and this was documented with an image. Under real-time ultrasound guidance, this vein was accessed with a 21 gauge micropuncture needle and image documentation was performed. A small dermatotomy was made at the access site with an 11 scalpel. A 0.018" wire was advanced into the SVC and used to estimate the length of the internal catheter. The access needle exchanged for a 64F micropuncture vascular sheath. The 0.018" wire was then removed and a 0.035" wire advanced into the IVC. An appropriate location for the subcutaneous reservoir was selected below the clavicle and an incision was made through the skin and underlying soft tissues. The subcutaneous tissues were then dissected using a combination of blunt and sharp surgical technique and  a pocket was formed. A single lumen power injectable portacatheter was then tunneled through the subcutaneous tissues from the pocket to the dermatotomy and the port reservoir placed within the subcutaneous pocket. The venous access site was then serially dilated and a peel away vascular sheath placed over the wire. The wire was removed and the port catheter advanced into position under fluoroscopic guidance. The catheter tip is positioned in the cavoatrial junction. This was documented with a spot image. The portacatheter was then tested and found to flush and aspirate well. The port was flushed with saline followed by 100 units/mL heparinized saline. The pocket was then closed in two layers using first subdermal inverted interrupted absorbable sutures followed by a running subcuticular suture. The epidermis was then sealed with Dermabond. The dermatotomy at the venous access site was also seal with Dermabond. Patient tolerated the procedure well and remained hemodynamically stable throughout. No complications encountered and no significant blood loss encountered IMPRESSION: Status  post right IJ port catheter placement. Catheter ready for use. Signed, Dulcy Fanny. Earleen Newport, DO [facility] Radiology Electronically Signed   By: Corrie Mckusick D.O.   On: 11/22/2015 16:17   Ir US Guide Vasc Access Right  Result Date: 11/22/2015 INDICATION: 65 year old female with a history of right-sided breast carcinoma. She tells me she will have chemotherapy for 3-4 months of cycling, then with surgery at with future radiation therapy. Given the interval time for healing, right-sided port is reasonable. EXAM: IMPLANTED PORT A CATH PLACEMENT WITH ULTRASOUND AND FLUOROSCOPIC GUIDANCE MEDICATIONS: 2.0 g Ancef; The antibiotic was administered within an appropriate time interval prior to skin puncture. ANESTHESIA/SEDATION: Moderate (conscious) sedation was employed during this procedure. A total of Versed 1.5 mg and Fentanyl 75 mcg was administered intravenously. Moderate Sedation Time: 19 minutes. The patient's level of consciousness and vital signs were monitored continuously by radiology nursing throughout the procedure under my direct supervision. FLUOROSCOPY TIME:  Zero minutes, 12 seconds (1 mGy) COMPLICATIONS: None PROCEDURE: The procedure, risks, benefits, and alternatives were explained to the patient. Questions regarding the procedure were encouraged and answered. The patient understands and consents to the procedure. Ultrasound survey was performed with images stored and sent to PACs. The right neck and chest was prepped with chlorhexidine, and draped in the usual sterile fashion using maximum barrier technique (cap and mask, sterile gown, sterile gloves, large sterile sheet, hand hygiene and cutaneous antiseptic). Antibiotic prophylaxis was provided with 2.0g Ancef administered IV one hour prior to skin incision. Local anesthesia was attained by infiltration with 1% lidocaine without epinephrine. Ultrasound demonstrated patency of the right internal jugular  vein, and this was documented with an image. Under real-time ultrasound guidance, this vein was accessed with a 21 gauge micropuncture needle and image documentation was performed. A small dermatotomy was made at the access site with an 11 scalpel. A 0.018" wire was advanced into the SVC and used to estimate the length of the internal catheter. The access needle exchanged for a 106F micropuncture vascular sheath. The 0.018" wire was then removed and a 0.035" wire advanced into the IVC. An appropriate location for the subcutaneous reservoir was selected below the clavicle and an incision was made through the skin and underlying soft tissues. The subcutaneous tissues were then dissected using a combination of blunt and sharp surgical technique and a pocket was formed. A single lumen power injectable portacatheter was then tunneled through the subcutaneous tissues from the pocket to the dermatotomy and the port reservoir placed  within the subcutaneous pocket. The venous access site was then serially dilated and a peel away vascular sheath placed over the wire. The wire was removed and the port catheter advanced into position under fluoroscopic guidance. The catheter tip is positioned in the cavoatrial junction. This was documented with a spot image. The portacatheter was then tested and found to flush and aspirate well. The port was flushed with saline followed by 100 units/mL heparinized saline. The pocket was then closed in two layers using first subdermal inverted interrupted absorbable sutures followed by a running subcuticular suture. The epidermis was then sealed with Dermabond. The dermatotomy at the venous access site was also seal with Dermabond. Patient tolerated the procedure well and remained hemodynamically stable throughout. No complications encountered and no significant blood loss encountered IMPRESSION: Status post right IJ port catheter placement. Catheter ready for use. Signed, Dulcy Fanny. Earleen Newport, DO  [facility] Radiology Electronically Signed   By: Corrie Mckusick D.O.   On: 11/22/2015 16:17    ELIGIBLE FOR AVAILABLE RESEARCH PROTOCOL: no  ASSESSMENT: 65 y.o. [place] woman status post right breast axillary tail lymph node biopsy 10/25/2015 for a clinical  [place] N2, stage IIIA invasive ductal carcinoma, estrogen and progesterone receptor positive, HER-2 nonamplified   (1) neoadjuvant chemotherapy started 11/23/2015 consisting of cyclophosphamide and docetaxel in dose dense fashion 4,  to be followed by weekly paclitaxel 12  (2) axillary lymph node dissection to follow chemotherapy  (3) adjuvant radiation to follow surgery  (4) anti-estrogens to follow at the completion of local treatment  (5) genetics testing pending    PLAN: [place] is ready to proceed to cycle 2 of chemotherapy today. She has done well enough with treatment that I don't think she will need to be seen on day 8. She will see me again on day 1 of cycle 3.  I don't believe the diarrhea problem she had on day 10 of cycle 1 is can be related to her chemotherapy. It could've been viral, could've been something she ate, or could've been too much use of MiraLAX and stool softeners. She was able in any case to maintain herself well-hydrated and that problem has resolved.  She still has not lost her hair but I expect she will over the next week. She is psychologically and physically prepared, with multiple wigs available as well as some hats and caps  She knows to call for any problems that may develop before her next visit here. Chauncey Cruel, MD   12/08/2015 9:33 AM Medical Oncology and Hematology [facility] [address] [place], [place] [address] Tel. [phone]    Fax. [phone]

## 2015-12-08 NOTE — Progress Notes (Signed)
[place]  [phone] [phone]     ID: Gina Navarro DOB: 05-May-1951  MR#: [id number]  NMM#:[id number]  Patient Care Team: Harrison Mons, PA-C as PCP - General (Physician Assistant) Jari Pigg, MD as Attending Physician (Dermatology) Kem Boroughs, FNP as Nurse Practitioner (Obstetrics and Gynecology) Adrian Prows, MD as Consulting Physician (Cardiology) Erroll Luna, MD as Consulting Physician (General Surgery) Chauncey Cruel, MD as Consulting Physician (Oncology) Eppie Gibson, MD as Attending Physician (Radiation Oncology) PCP: Harrison Mons, PA-C Chauncey Cruel, MD OTHER MD:  CHIEF COMPLAINT: Estrogen receptor positive breast cancer  CURRENT TREATMENT: Neoadjuvant chemotherapy   BREAST CANCER HISTORY: From the original intake note:  "Gina Navarro" noted a mass in her right axilla 10/20/2015 and brought it to medical attention. She was scheduled for bilateral diagnostic mammography with tomography at [facility] 10/24/2015. The breast composition was category B. Mammogram of the breast showed no suspicious masses. However there was palpable right axillary adenopathy, more specifically a 5 cm "lump beneath the anterior right axilla, and ultrasound was obtained.. On ultrasound, the breast itself was unremarkable. However there were multiple enlarged right axillary lymph nodes the largest measuring 5.7 cm.   Biopsy of one of the enlarged right axillary lymph nodes on 10/25/2015 showed metastatic carcinoma, which was cytokeratin 7 positive, estrogen receptor 95% positive, progesterone receptor 10% positive, both with strong staining intensity, and HER-2 nonamplified, with a signals ratio 1.62, and the number percell 2.10.  MRI of the breasts 11/06/2015 again showed no mass or abnormal enhancement in the right breast or left breast. In the right axilla there were 6 enlarged grossly abnormal lymph nodes.  The patient's subsequent history is as detailed  below  INTERVAL HISTORY: Gina Navarro returns today for continuing neoadjuvant treatment of her estrogen receptor positive breast cancer, accompanied by a friend.. Today is day 1 cycle 2 of 4 planned cycles of cyclophosphamide and doxorubicin, to be followed by weekly paclitaxel 12.  REVIEW OF SYSTEMS: Gina Navarro had some diarrhea on day 10 of the treatment. She is not sure if she ate something. She had no other symptoms related to this. She was taking quite a bit of MiraLAX and stool softeners because of concerns with constipation with cycle 1. At any rate that lasted a little over a day and has resolved. She has had no bowel movements today and she had normal bowel movements yesterday. She has not yet started to lose her hair. She has minimal tingling of her scalp. She has had no mouth sores, no nausea or vomiting, and good energy. A detailed review of systems today was noncontributory  PAST MEDICAL HISTORY: Past Medical History:  Diagnosis Date  . Arthritis   . Breast cancer metastasized to axillary lymph node ([place]) 11/02/2015  . Diabetes mellitus without complication ([place]) 3159   oral meds until Victoza in 07/2012, now change to Invokana  . Hypertension 2006  . Menopausal state   . Skin cancer    [place]  PAST SURGICAL HISTORY: Past Surgical History:  Procedure Laterality Date  . MOHS SURGERY  2002   chin x 1 and right forehead X 1 in 2004  . TONSILLECTOMY AND ADENOIDECTOMY      FAMILY HISTORY Family History  Problem Relation Age of Onset  . Adopted: Yes  . Liver cancer Mother   . Lung cancer Mother   . Brain cancer Father   The patient is adopted and knows little about her biological family. Both are adopted parents have died. Her adopted Brother (  not biologically related) Lowry Ram, lives in [place].  GYNECOLOGIC HISTORY:  Patient's last menstrual period was 05/13/1996. Menarche age 49, the patient is GX P0. She went through menopause in her early 57s and did not take hormone  replacement. She never used oral contraceptives.  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 She is contracted by group who contracts to the state. She is single and lives alone with 4 dogs. She has a large fasting yard. She attends the [employer]    ADVANCED DIRECTIVES: The patient completed a healthcare part of attorney document and a copy was filed to be scanned 12/01/2015. She has named [employer] representative as her healthcare power of attorney. She can be reached at [place]: Social History  Substance Use Topics  . Smoking status: Former Smoker    Packs/day: 1.00    Years: 5.00    Types: Cigarettes  . Smokeless tobacco: Never Used  . Alcohol use No     Colonoscopy: Never  PAP:  Bone density: Never   Allergies  Allergen Reactions  . Bee Venom   . Statins Other (See Comments)    MYALGIAS with Crestor, Zocor. Tolerating Lipitor (05/2013)  . Victoza [Liraglutide]     Dizziness, peripheral neuropathy, constipation, heart palpitations    Current Outpatient Prescriptions  Medication Sig Dispense Refill  . canagliflozin (INVOKANA) 300 MG TABS tablet Take 1 tablet (300 mg total) by mouth daily before breakfast. 90 tablet 3  . cholecalciferol (VITAMIN D) 1000 UNITS tablet Take 1,000 Units by mouth daily.    Marland Kitchen dexamethasone (DECADRON) 4 MG tablet Take 2 tablets by mouth once a day on the day after chemotherapy and then take 2 tablets two times a day for 2 days. Take with food. 30 tablet 1  . glucosamine-chondroitin 500-400 MG tablet Take 1 tablet by mouth 3 (three) times daily.    Marland Kitchen lidocaine-prilocaine (EMLA) cream Apply to port 1-2 hours before procedure 30 g 1  . lisinopril-hydrochlorothiazide (PRINZIDE,ZESTORETIC) 20-25 MG tablet TAKE 1 TABLET BY MOUTH DAILY 90 tablet 3  . LORazepam (ATIVAN) 0.5 MG tablet  Take 1 tablet (0.5 mg total) by mouth at bedtime as needed (Nausea or vomiting). 30 tablet 0  . metFORMIN (GLUCOPHAGE) 1000 MG tablet Take 1 tablet (1,000 mg total)  By mouth 2 (two) times daily with a meal 180 tablet 3  . nitroGLYCERIN (NITROSTAT) 0.4 MG SL tablet Place 1 tablet (0.4 mg total) under the tongue every 5 (five) minutes as needed for chest pain. 25 tablet 1  . prochlorperazine (COMPAZINE) 10 MG tablet Take 1 tablet (10 mg total) by mouth every 6 (six) hours as needed (Nausea or vomiting). 30 tablet 1   No current facility-administered medications for this visit.     OBJECTIVE: Middle-aged white woman In no acute distress     ECOG FS:1 - Symptomatic but completely ambulatory  Sclerae unicteric, pupils round and equal Oropharynx clear and moist-- no thrush or other lesions No cervical or supraclavicular adenopathy Lungs no rales or rhonchi Heart regular rate and rhythm Abd soft, nontender, positive bowel sounds MSK no focal spinal tenderness, no upper extremity lymphedema Neuro: nonfocal, well oriented, appropriate affect Breasts: Deferred   LAB RESULTS:  CMP     Component Value Date/Time   NA 136 12/01/2015 1011   K 4.6 12/01/2015 1011  CL 109 11/22/2015 0845   CO2 22 12/01/2015 1011   GLUCOSE 187 (H) 12/01/2015 1011   BUN 32.7 (H) 12/01/2015 1011   CREATININE 1.3 (H) 12/01/2015 1011   CALCIUM 9.7 12/01/2015 1011   PROT 6.7 12/01/2015 1011   ALBUMIN 3.8 12/01/2015 1011   AST 15 12/01/2015 1011   ALT 22 12/01/2015 1011   ALKPHOS 66 12/01/2015 1011   BILITOT 0.54 12/01/2015 1011   GFRNONAA 46 (L) 11/22/2015 0845   GFRNONAA 67 08/31/2013 1007   GFRAA 53 (L) 11/22/2015 0845   GFRAA 77 08/31/2013 1007    INo results found for: SPEP, UPEP  Lab Results  Component Value Date   WBC 8.6 12/08/2015   NEUTROABS 7.2 (H) 12/08/2015   HGB 11.3 (L) 12/08/2015   HCT 34.0 (L) 12/08/2015   MCV 86.1 12/08/2015   PLT 168 12/08/2015      Chemistry      Component  Value Date/Time   NA 136 12/01/2015 1011   K 4.6 12/01/2015 1011   CL 109 11/22/2015 0845   CO2 22 12/01/2015 1011   BUN 32.7 (H) 12/01/2015 1011   CREATININE 1.3 (H) 12/01/2015 1011      Component Value Date/Time   CALCIUM 9.7 12/01/2015 1011   ALKPHOS 66 12/01/2015 1011   AST 15 12/01/2015 1011   ALT 22 12/01/2015 1011   BILITOT 0.54 12/01/2015 1011       No results found for: LABCA2  No components found for: [id number]  No results for input(s): INR in the last 168 hours.  Urinalysis    Component Value Date/Time   BILIRUBINUR n 12/13/2014 1000   PROTEINUR n 12/13/2014 1000   UROBILINOGEN negative 12/13/2014 1000   NITRITE n 12/13/2014 1000   LEUKOCYTESUR Negative 11/05/2011 1201     STUDIES: Ct Chest W Contrast  Result Date: 11/17/2015 CLINICAL DATA:  New diagnosis of right breast cancer. EXAM: CT CHEST, ABDOMEN, AND PELVIS WITH CONTRAST TECHNIQUE: Multidetector CT imaging of the chest, abdomen and pelvis was performed following the standard protocol during bolus administration of intravenous contrast. CONTRAST:  62m ISOVUE-300 IOPAMIDOL (ISOVUE-300) INJECTION 61% COMPARISON:  None. FINDINGS: CT CHEST FINDINGS Mediastinum/Lymph Nodes: The heart size appears normal. Aortic atherosclerosis is identified. Calcification in the RCA and LAD coronary artery is identified. The trachea appears patent and is midline. Normal appearance of the esophagus. Multiple enlarged right axillary lymph nodes are identified. Index right axillary lymph node measures 2.4 cm, image 99 of series 2. Right retro pectoral node is enlarged measuring 1.4 cm, image 17 of series 2. No no enlarged supraclavicular adenopathy. No enlarged left axillary nodes. No enlarged internal mammary lymph nodes. Lungs/Pleura: No pleural fluid. There is no airspace consolidation or atelectasis identified. There is a small nodule within the superior segment of the left lower lobe measuring 4 mm, nonspecific, image number 63 of  series 5. Musculoskeletal: No chest wall mass or suspicious bone lesions identified. CT ABDOMEN PELVIS FINDINGS Hepatobiliary: No suspicious liver abnormalities identified. Gallstones are identified within the dependent portion of the gallbladder. No biliary dilatation. There is no biliary dilatation. Pancreas: Normal appearance of the pancreas. Spleen: Normal appearance of the spleen. Adrenals/Urinary Tract: The adrenal glands are within normal limits. No kidney abnormality identified. The urinary bladder appears normal. Stomach/Bowel: The stomach is within normal limits. The small bowel loops have a normal course and caliber. No obstruction. Normal appearance of the colon. Vascular/Lymphatic: Calcified atherosclerotic disease involves the abdominal aorta. No aneurysm. No enlarged retroperitoneal or mesenteric  adenopathy. No enlarged pelvic or inguinal lymph nodes. Reproductive: No mass or other significant abnormality. Other: There is no ascites or focal fluid collections within the abdomen or pelvis. Musculoskeletal: No aggressive lytic or sclerotic bone lesions. There is degenerative disc disease noted within the lumbar spine. IMPRESSION: 1. Enlarged right axillary and retropectoral lymph nodes compatible with metastatic adenopathy. 2. No evidence for distant metastatic disease from patient's right breast cancer. 3. Aortic atherosclerosis and multi vessel coronary artery calcification 4. Small pulmonary nodule in the superior segment of left lower lobe measures 4 mm. 5. Gallstones. Electronically Signed   By: Kerby Moors M.D.   On: 11/17/2015 10:42   Nm Bone Scan Whole Body  Result Date: 11/17/2015 CLINICAL DATA:  Breast cancer. EXAM: NUCLEAR MEDICINE WHOLE BODY BONE SCAN TECHNIQUE: Whole body anterior and posterior images were obtained approximately 3 hours after intravenous injection of radiopharmaceutical. RADIOPHARMACEUTICALS:  26.9 MCi Technetium-61mMDP IV COMPARISON:  CT 11/17/2015. FINDINGS:  Bilateral renal function and excretion. Minimal increased activity at the shoulders, sternoclavicular joints, hands most likely degenerative. No evidence of metastatic disease. IMPRESSION: No evidence of metastatic disease. Electronically Signed   By: TMarcello Moores Register   On: 11/17/2015 13:06   Ct Abdomen Pelvis W Contrast  Result Date: 11/17/2015 CLINICAL DATA:  New diagnosis of right breast cancer. EXAM: CT CHEST, ABDOMEN, AND PELVIS WITH CONTRAST TECHNIQUE: Multidetector CT imaging of the chest, abdomen and pelvis was performed following the standard protocol during bolus administration of intravenous contrast. CONTRAST:  860mISOVUE-300 IOPAMIDOL (ISOVUE-300) INJECTION 61% COMPARISON:  None. FINDINGS: CT CHEST FINDINGS Mediastinum/Lymph Nodes: The heart size appears normal. Aortic atherosclerosis is identified. Calcification in the RCA and LAD coronary artery is identified. The trachea appears patent and is midline. Normal appearance of the esophagus. Multiple enlarged right axillary lymph nodes are identified. Index right axillary lymph node measures 2.4 cm, image 99 of series 2. Right retro pectoral node is enlarged measuring 1.4 cm, image 17 of series 2. No no enlarged supraclavicular adenopathy. No enlarged left axillary nodes. No enlarged internal mammary lymph nodes. Lungs/Pleura: No pleural fluid. There is no airspace consolidation or atelectasis identified. There is a small nodule within the superior segment of the left lower lobe measuring 4 mm, nonspecific, image number 63 of series 5. Musculoskeletal: No chest wall mass or suspicious bone lesions identified. CT ABDOMEN PELVIS FINDINGS Hepatobiliary: No suspicious liver abnormalities identified. Gallstones are identified within the dependent portion of the gallbladder. No biliary dilatation. There is no biliary dilatation. Pancreas: Normal appearance of the pancreas. Spleen: Normal appearance of the spleen. Adrenals/Urinary Tract: The adrenal glands  are within normal limits. No kidney abnormality identified. The urinary bladder appears normal. Stomach/Bowel: The stomach is within normal limits. The small bowel loops have a normal course and caliber. No obstruction. Normal appearance of the colon. Vascular/Lymphatic: Calcified atherosclerotic disease involves the abdominal aorta. No aneurysm. No enlarged retroperitoneal or mesenteric adenopathy. No enlarged pelvic or inguinal lymph nodes. Reproductive: No mass or other significant abnormality. Other: There is no ascites or focal fluid collections within the abdomen or pelvis. Musculoskeletal: No aggressive lytic or sclerotic bone lesions. There is degenerative disc disease noted within the lumbar spine. IMPRESSION: 1. Enlarged right axillary and retropectoral lymph nodes compatible with metastatic adenopathy. 2. No evidence for distant metastatic disease from patient's right breast cancer. 3. Aortic atherosclerosis and multi vessel coronary artery calcification 4. Small pulmonary nodule in the superior segment of left lower lobe measures 4 mm. 5. Gallstones. Electronically Signed  By: Kerby Moors M.D.   On: 11/17/2015 10:42   Ir Fluoro Guide Cv Line Left  Result Date: 11/22/2015 INDICATION: 64 year old female with a history of right-sided breast carcinoma. She tells me she will have chemotherapy for 3-4 months of cycling, then with surgery at with future radiation therapy. Given the interval time for healing, right-sided port is reasonable. EXAM: IMPLANTED PORT A CATH PLACEMENT WITH ULTRASOUND AND FLUOROSCOPIC GUIDANCE MEDICATIONS: 2.0 g Ancef; The antibiotic was administered within an appropriate time interval prior to skin puncture. ANESTHESIA/SEDATION: Moderate (conscious) sedation was employed during this procedure. A total of Versed 1.5 mg and Fentanyl 75 mcg was administered intravenously. Moderate Sedation Time: 19 minutes. The patient's level of consciousness and vital signs were monitored  continuously by radiology nursing throughout the procedure under my direct supervision. FLUOROSCOPY TIME:  Zero minutes, 12 seconds (1 mGy) COMPLICATIONS: None PROCEDURE: The procedure, risks, benefits, and alternatives were explained to the patient. Questions regarding the procedure were encouraged and answered. The patient understands and consents to the procedure. Ultrasound survey was performed with images stored and sent to PACs. The right neck and chest was prepped with chlorhexidine, and draped in the usual sterile fashion using maximum barrier technique (cap and mask, sterile gown, sterile gloves, large sterile sheet, hand hygiene and cutaneous antiseptic). Antibiotic prophylaxis was provided with 2.0g Ancef administered IV one hour prior to skin incision. Local anesthesia was attained by infiltration with 1% lidocaine without epinephrine. Ultrasound demonstrated patency of the right internal jugular vein, and this was documented with an image. Under real-time ultrasound guidance, this vein was accessed with a 21 gauge micropuncture needle and image documentation was performed. A small dermatotomy was made at the access site with an 11 scalpel. A 0.018" wire was advanced into the SVC and used to estimate the length of the internal catheter. The access needle exchanged for a 55F micropuncture vascular sheath. The 0.018" wire was then removed and a 0.035" wire advanced into the IVC. An appropriate location for the subcutaneous reservoir was selected below the clavicle and an incision was made through the skin and underlying soft tissues. The subcutaneous tissues were then dissected using a combination of blunt and sharp surgical technique and a pocket was formed. A single lumen power injectable portacatheter was then tunneled through the subcutaneous tissues from the pocket to the dermatotomy and the port reservoir placed within the subcutaneous pocket. The venous access site was then serially dilated and a  peel away vascular sheath placed over the wire. The wire was removed and the port catheter advanced into position under fluoroscopic guidance. The catheter tip is positioned in the cavoatrial junction. This was documented with a spot image. The portacatheter was then tested and found to flush and aspirate well. The port was flushed with saline followed by 100 units/mL heparinized saline. The pocket was then closed in two layers using first subdermal inverted interrupted absorbable sutures followed by a running subcuticular suture. The epidermis was then sealed with Dermabond. The dermatotomy at the venous access site was also seal with Dermabond. Patient tolerated the procedure well and remained hemodynamically stable throughout. No complications encountered and no significant blood loss encountered IMPRESSION: Status post right IJ port catheter placement. Catheter ready for use. Signed, Dulcy Fanny. Earleen Newport, DO [facility] Radiology Electronically Signed   By: Corrie Mckusick D.O.   On: 11/22/2015 16:17   Ir US Guide Vasc Access Right  Result Date: 11/22/2015 INDICATION: 65 year old female with a history  of right-sided breast carcinoma. She tells me she will have chemotherapy for 3-4 months of cycling, then with surgery at with future radiation therapy. Given the interval time for healing, right-sided port is reasonable. EXAM: IMPLANTED PORT A CATH PLACEMENT WITH ULTRASOUND AND FLUOROSCOPIC GUIDANCE MEDICATIONS: 2.0 g Ancef; The antibiotic was administered within an appropriate time interval prior to skin puncture. ANESTHESIA/SEDATION: Moderate (conscious) sedation was employed during this procedure. A total of Versed 1.5 mg and Fentanyl 75 mcg was administered intravenously. Moderate Sedation Time: 19 minutes. The patient's level of consciousness and vital signs were monitored continuously by radiology nursing throughout the procedure under my direct supervision.  FLUOROSCOPY TIME:  Zero minutes, 12 seconds (1 mGy) COMPLICATIONS: None PROCEDURE: The procedure, risks, benefits, and alternatives were explained to the patient. Questions regarding the procedure were encouraged and answered. The patient understands and consents to the procedure. Ultrasound survey was performed with images stored and sent to PACs. The right neck and chest was prepped with chlorhexidine, and draped in the usual sterile fashion using maximum barrier technique (cap and mask, sterile gown, sterile gloves, large sterile sheet, hand hygiene and cutaneous antiseptic). Antibiotic prophylaxis was provided with 2.0g Ancef administered IV one hour prior to skin incision. Local anesthesia was attained by infiltration with 1% lidocaine without epinephrine. Ultrasound demonstrated patency of the right internal jugular vein, and this was documented with an image. Under real-time ultrasound guidance, this vein was accessed with a 21 gauge micropuncture needle and image documentation was performed. A small dermatotomy was made at the access site with an 11 scalpel. A 0.018" wire was advanced into the SVC and used to estimate the length of the internal catheter. The access needle exchanged for a 67F micropuncture vascular sheath. The 0.018" wire was then removed and a 0.035" wire advanced into the IVC. An appropriate location for the subcutaneous reservoir was selected below the clavicle and an incision was made through the skin and underlying soft tissues. The subcutaneous tissues were then dissected using a combination of blunt and sharp surgical technique and a pocket was formed. A single lumen power injectable portacatheter was then tunneled through the subcutaneous tissues from the pocket to the dermatotomy and the port reservoir placed within the subcutaneous pocket. The venous access site was then serially dilated and a peel away vascular sheath placed over the wire. The wire was removed and the port catheter  advanced into position under fluoroscopic guidance. The catheter tip is positioned in the cavoatrial junction. This was documented with a spot image. The portacatheter was then tested and found to flush and aspirate well. The port was flushed with saline followed by 100 units/mL heparinized saline. The pocket was then closed in two layers using first subdermal inverted interrupted absorbable sutures followed by a running subcuticular suture. The epidermis was then sealed with Dermabond. The dermatotomy at the venous access site was also seal with Dermabond. Patient tolerated the procedure well and remained hemodynamically stable throughout. No complications encountered and no significant blood loss encountered IMPRESSION: Status post right IJ port catheter placement. Catheter ready for use. Signed, Dulcy Fanny. Earleen Newport, DO [facility] Radiology Electronically Signed   By: Corrie Mckusick D.O.   On: 11/22/2015 16:17    ELIGIBLE FOR AVAILABLE RESEARCH PROTOCOL: no  ASSESSMENT: 65 y.o. [place] woman status post right breast axillary tail lymph node biopsy 10/25/2015 for a clinical  TX N2, stage IIIA invasive ductal carcinoma, estrogen and progesterone receptor positive, HER-2 nonamplified   (1)  neoadjuvant chemotherapy started 11/23/2015 consisting of cyclophosphamide and docetaxel in dose dense fashion 4,  to be followed by weekly paclitaxel 12  (2) axillary lymph node dissection to follow chemotherapy  (3) adjuvant radiation to follow surgery  (4) anti-estrogens to follow at the completion of local treatment  (5) genetics testing pending    PLAN: Yazlynn will proceed with cycle 2 of 4 planned dose dense doxorubicin and cyclophosphamide. I think the brief problem with diarrhea she had is not going to be chemotherapy related and therefore it is unlikely to recur. If it does however she will call us immediately. I would use [facility] in that situation.  I  anticipate she will lose her hair in the next week or so. She is "prepared" both psychologically and physically, with several wigs and hats  She did well enough with cycle one that I don't think she needs to be seen on day 8. She will see me again on day 1 of cycle 3, but she knows to call for any problems that may develop before the next visit.  Chauncey Cruel, MD   12/08/2015 9:23 AM Medical Oncology and Hematology [facility] [address] [place],  [address] Tel. [phone]    Fax. [phone]

## 2015-12-08 NOTE — Patient Instructions (Signed)
[facility] Discharge Instructions for Patients Receiving Chemotherapy  Today you received the following chemotherapy agents:Adriamycin and Cytoxan   To help prevent nausea and vomiting after your treatment, we encourage you to take your nausea medication as directed.    If you develop nausea and vomiting that is not controlled by your nausea medication, call the clinic.   BELOW ARE SYMPTOMS THAT SHOULD BE REPORTED IMMEDIATELY:  *FEVER GREATER THAN 100.5 F  *CHILLS WITH OR WITHOUT FEVER  NAUSEA AND VOMITING THAT IS NOT CONTROLLED WITH YOUR NAUSEA MEDICATION  *UNUSUAL SHORTNESS OF BREATH  *UNUSUAL BRUISING OR BLEEDING  TENDERNESS IN MOUTH AND THROAT WITH OR WITHOUT PRESENCE OF ULCERS  *URINARY PROBLEMS  *BOWEL PROBLEMS  UNUSUAL RASH Items with * indicate a potential emergency and should be followed up as soon as possible.  Feel free to call the clinic you have any questions or concerns. The clinic phone number is [phone].  Please show the CHEMO ALERT CARD at check-in to the Emergency Department and triage nurse.   

## 2015-12-09 ENCOUNTER — Ambulatory Visit: Payer: BLUE CROSS/BLUE SHIELD

## 2015-12-16 ENCOUNTER — Other Ambulatory Visit: Payer: Self-pay | Admitting: Physician Assistant

## 2015-12-19 ENCOUNTER — Other Ambulatory Visit: Payer: Self-pay

## 2015-12-19 ENCOUNTER — Ambulatory Visit: Payer: BLUE CROSS/BLUE SHIELD | Admitting: Oncology

## 2015-12-19 MED ORDER — GLUCOSE BLOOD VI STRP
ORAL_STRIP | 2 refills | Status: AC
Start: 2015-12-19 — End: ?

## 2015-12-19 MED ORDER — BAYER CONTOUR MONITOR W/DEVICE KIT: PACK | 0 refills | Status: AC

## 2015-12-20 ENCOUNTER — Ambulatory Visit: Payer: 59 | Admitting: Nurse Practitioner

## 2015-12-21 ENCOUNTER — Other Ambulatory Visit: Payer: Self-pay | Admitting: *Deleted

## 2015-12-21 DIAGNOSIS — C50911 Malignant neoplasm of unspecified site of right female breast: Secondary | ICD-10-CM

## 2015-12-21 DIAGNOSIS — C773 Secondary and unspecified malignant neoplasm of axilla and upper limb lymph nodes: Principal | ICD-10-CM

## 2015-12-22 ENCOUNTER — Ambulatory Visit (HOSPITAL_BASED_OUTPATIENT_CLINIC_OR_DEPARTMENT_OTHER): Payer: BLUE CROSS/BLUE SHIELD | Admitting: Oncology

## 2015-12-22 ENCOUNTER — Other Ambulatory Visit (HOSPITAL_BASED_OUTPATIENT_CLINIC_OR_DEPARTMENT_OTHER): Payer: BLUE CROSS/BLUE SHIELD

## 2015-12-22 ENCOUNTER — Ambulatory Visit: Payer: BLUE CROSS/BLUE SHIELD

## 2015-12-22 ENCOUNTER — Ambulatory Visit (HOSPITAL_BASED_OUTPATIENT_CLINIC_OR_DEPARTMENT_OTHER): Payer: BLUE CROSS/BLUE SHIELD

## 2015-12-22 ENCOUNTER — Encounter: Payer: Self-pay | Admitting: *Deleted

## 2015-12-22 VITALS — BP 142/53 | HR 95 | Temp 98.4°F | Resp 18 | Ht 63.5 in | Wt 170.2 lb

## 2015-12-22 DIAGNOSIS — D649 Anemia, unspecified: Secondary | ICD-10-CM

## 2015-12-22 DIAGNOSIS — C50911 Malignant neoplasm of unspecified site of right female breast: Secondary | ICD-10-CM

## 2015-12-22 DIAGNOSIS — Z170 Estrogen receptor positive status [ER+]: Secondary | ICD-10-CM | POA: Diagnosis not present

## 2015-12-22 DIAGNOSIS — Z5189 Encounter for other specified aftercare: Secondary | ICD-10-CM

## 2015-12-22 DIAGNOSIS — Z5111 Encounter for antineoplastic chemotherapy: Secondary | ICD-10-CM

## 2015-12-22 DIAGNOSIS — C773 Secondary and unspecified malignant neoplasm of axilla and upper limb lymph nodes: Secondary | ICD-10-CM | POA: Diagnosis not present

## 2015-12-22 DIAGNOSIS — C50619 Malignant neoplasm of axillary tail of unspecified female breast: Secondary | ICD-10-CM

## 2015-12-22 DIAGNOSIS — C50919 Malignant neoplasm of unspecified site of unspecified female breast: Secondary | ICD-10-CM

## 2015-12-22 DIAGNOSIS — C50611 Malignant neoplasm of axillary tail of right female breast: Secondary | ICD-10-CM

## 2015-12-22 LAB — COMPREHENSIVE METABOLIC PANEL
ALBUMIN: 3.7 g/dL (ref 3.5–5.0)
ALK PHOS: 91 U/L (ref 40–150)
ALT: 21 U/L (ref 0–55)
AST: 15 U/L (ref 5–34)
Anion Gap: 13 mEq/L — ABNORMAL HIGH (ref 3–11)
BUN: 19.6 mg/dL (ref 7.0–26.0)
CALCIUM: 9 mg/dL (ref 8.4–10.4)
CO2: 20 mEq/L — ABNORMAL LOW (ref 22–29)
Chloride: 101 mEq/L (ref 98–109)
Creatinine: 1.2 mg/dL — ABNORMAL HIGH (ref 0.6–1.1)
EGFR: 49 mL/min/{1.73_m2} — ABNORMAL LOW (ref >90)
GLUCOSE: 214 mg/dL — AB (ref 70–140)
POTASSIUM: 4 meq/L (ref 3.5–5.1)
Sodium: 133 mEq/L — ABNORMAL LOW (ref 136–145)
Total Bilirubin: 0.6 mg/dL (ref 0.20–1.20)
Total Protein: 6.9 g/dL (ref 6.4–8.3)

## 2015-12-22 LAB — CBC WITH DIFFERENTIAL/PLATELET
BASO%: 0.6 % (ref 0.0–2.0)
BASOS ABS: 0.1 10*3/uL (ref 0.0–0.1)
EOS ABS: 0 10*3/uL (ref 0.0–0.5)
EOS%: 0.3 % (ref 0.0–7.0)
HEMATOCRIT: 29.8 % — AB (ref 34.8–46.6)
HEMOGLOBIN: 10 g/dL — AB (ref 11.6–15.9)
LYMPH#: 0.6 10*3/uL — AB (ref 0.9–3.3)
LYMPH%: 6 % — ABNORMAL LOW (ref 14.0–49.7)
MCH: 28.9 pg (ref 25.1–34.0)
MCHC: 33.4 g/dL (ref 31.5–36.0)
MCV: 86.3 fL (ref 79.5–101.0)
MONO#: 0.4 10*3/uL (ref 0.1–0.9)
MONO%: 3.8 % (ref 0.0–14.0)
NEUT#: 9.4 10*3/uL — ABNORMAL HIGH (ref 1.5–6.5)
NEUT%: 89.3 % — ABNORMAL HIGH (ref 38.4–76.8)
Platelets: 192 10*3/uL (ref 145–400)
RBC: 3.46 10*6/uL — ABNORMAL LOW (ref 3.70–5.45)
RDW: 13.2 % (ref 11.2–14.5)
WBC: 10.5 10*3/uL — ABNORMAL HIGH (ref 3.9–10.3)

## 2015-12-22 MED ORDER — SODIUM CHLORIDE 0.9 % IV SOLN
Freq: Once | INTRAVENOUS | Status: AC
  Administered 2015-12-22: 11:00:00 via INTRAVENOUS
  Filled 2015-12-22: qty 5

## 2015-12-22 MED ORDER — SODIUM CHLORIDE 0.9 % IV SOLN
600.0000 mg/m2 | Freq: Once | INTRAVENOUS | Status: AC
  Administered 2015-12-22: 1140 mg via INTRAVENOUS
  Filled 2015-12-22: qty 57

## 2015-12-22 MED ORDER — PALONOSETRON HCL INJECTION 0.25 MG/5ML
0.2500 mg | Freq: Once | INTRAVENOUS | Status: AC
  Administered 2015-12-22: 0.25 mg via INTRAVENOUS

## 2015-12-22 MED ORDER — SODIUM CHLORIDE 0.9% FLUSH
10.0000 mL | INTRAVENOUS | Status: DC | PRN
  Administered 2015-12-22: 10 mL via INTRAVENOUS
  Filled 2015-12-22: qty 10

## 2015-12-22 MED ORDER — HEPARIN SOD (PORK) LOCK FLUSH 100 UNIT/ML IV SOLN
500.0000 [IU] | Freq: Once | INTRAVENOUS | Status: AC | PRN
  Administered 2015-12-22: 500 [IU]
  Filled 2015-12-22: qty 5

## 2015-12-22 MED ORDER — PEGFILGRASTIM 6 MG/0.6ML ~~LOC~~ PSKT
6.0000 mg | PREFILLED_SYRINGE | Freq: Once | SUBCUTANEOUS | Status: AC
  Administered 2015-12-22: 6 mg via SUBCUTANEOUS
  Filled 2015-12-22: qty 0.6

## 2015-12-22 MED ORDER — PALONOSETRON HCL INJECTION 0.25 MG/5ML
INTRAVENOUS | Status: AC
  Filled 2015-12-22: qty 5

## 2015-12-22 MED ORDER — SODIUM CHLORIDE 0.9% FLUSH
10.0000 mL | INTRAVENOUS | Status: DC | PRN
  Administered 2015-12-22: 10 mL
  Filled 2015-12-22: qty 10

## 2015-12-22 MED ORDER — DOXORUBICIN HCL CHEMO IV INJECTION 2 MG/ML
60.0000 mg/m2 | Freq: Once | INTRAVENOUS | Status: AC
  Administered 2015-12-22: 114 mg via INTRAVENOUS
  Filled 2015-12-22: qty 57

## 2015-12-22 MED ORDER — SODIUM CHLORIDE 0.9 % IV SOLN
Freq: Once | INTRAVENOUS | Status: AC
  Administered 2015-12-22: 11:00:00 via INTRAVENOUS

## 2015-12-22 NOTE — Patient Instructions (Signed)

## 2015-12-22 NOTE — Addendum Note (Signed)
Addended by: Chauncey Cruel on: 12/22/2015 10:47 AM   Modules accepted: Orders

## 2015-12-22 NOTE — Progress Notes (Signed)
Port flush done on exam side.

## 2015-12-22 NOTE — Patient Instructions (Signed)
[facility] Discharge Instructions for Patients Receiving Chemotherapy  Today you received the following chemotherapy agents: adriamycin, cytoxan  To help prevent nausea and vomiting after your treatment, we encourage you to take your nausea medication.  Take it as often as prescribed.     If you develop nausea and vomiting that is not controlled by your nausea medication, call the clinic. If it is after clinic hours your family physician or the after hours number for the clinic or go to the Emergency Department.   BELOW ARE SYMPTOMS THAT SHOULD BE REPORTED IMMEDIATELY:  *FEVER GREATER THAN 100.5 F  *CHILLS WITH OR WITHOUT FEVER  NAUSEA AND VOMITING THAT IS NOT CONTROLLED WITH YOUR NAUSEA MEDICATION  *UNUSUAL SHORTNESS OF BREATH  *UNUSUAL BRUISING OR BLEEDING  TENDERNESS IN MOUTH AND THROAT WITH OR WITHOUT PRESENCE OF ULCERS  *URINARY PROBLEMS  *BOWEL PROBLEMS  UNUSUAL RASH Items with * indicate a potential emergency and should be followed up as soon as possible.  Feel free to call the clinic you have any questions or concerns. The clinic phone number is [phone].   I have been informed and understand all the instructions given to me. I know to contact the clinic, my physician, or go to the Emergency Department if any problems should occur. I do not have any questions at this time, but understand that I may call the clinic during office hours   should I have any questions or need assistance in obtaining follow up care.    __________________________________________  _____________  __________ Signature of Patient or Authorized Representative            Date                   Time    __________________________________________ Nurse's Signature    

## 2015-12-22 NOTE — Progress Notes (Signed)
[place]  [phone] [phone]     ID: Gina Navarro DOB: 1951-01-29  MR#: [id number]  ZHY#:[id number]  Patient Care Team: Harrison Mons, PA-C as PCP - General (Physician Assistant) Jari Pigg, MD as Attending Physician (Dermatology) Kem Boroughs, FNP as Nurse Practitioner (Obstetrics and Gynecology) Adrian Prows, MD as Consulting Physician (Cardiology) Erroll Luna, MD as Consulting Physician (General Surgery) Chauncey Cruel, MD as Consulting Physician (Oncology) Eppie Gibson, MD as Attending Physician (Radiation Oncology) PCP: Harrison Mons, PA-C Chauncey Cruel, MD OTHER MD:  CHIEF COMPLAINT: Estrogen receptor positive breast cancer  CURRENT TREATMENT: Neoadjuvant chemotherapy   BREAST CANCER HISTORY: From the original intake note:  "Gina Navarro" noted a mass in her right axilla 10/20/2015 and brought it to medical attention. She was scheduled for bilateral diagnostic mammography with tomography at [facility] 10/24/2015. The breast composition was category B. Mammogram of the breast showed no suspicious masses. However there was palpable right axillary adenopathy, more specifically a 5 cm "lump beneath the anterior right axilla, and ultrasound was obtained.. On ultrasound, the breast itself was unremarkable. However there were multiple enlarged right axillary lymph nodes the largest measuring 5.7 cm.   Biopsy of one of the enlarged right axillary lymph nodes on 10/25/2015 showed metastatic carcinoma, which was cytokeratin 7 positive, estrogen receptor 95% positive, progesterone receptor 10% positive, both with strong staining intensity, and HER-2 nonamplified, with a signals ratio 1.62, and the number percell 2.10.  MRI of the breasts 11/06/2015 again showed no mass or abnormal enhancement in the right breast or left breast. In the right axilla there were 6 enlarged grossly abnormal lymph nodes.  The patient's subsequent history is as detailed  below  INTERVAL HISTORY: Gina Navarro returns today for neoadjuvant therapy of her breast cancer. Today is day 1 cycle 3 of 4 planned cycles of cyclophosphamide and doxorubicin, to be followed by weekly paclitaxel 12.  REVIEW OF SYSTEMS: Gina Navarro has lost her hair, but has purchased a variety of simple bandanna's, mostly solid color, which are sober and practical. She has a little bit of a summer cold as she puts it, with evidence stuffy nose and mild cough. She has had no fever, no purulence, no shortness of breath and no pleurisy. Her port is working well. She had mouth sores originally but those have resolved and she is using Biotene at this point. She is continuing to work full time and she exercises regularly. A detailed review of systems today was otherwise stable  PAST MEDICAL HISTORY: Past Medical History:  Diagnosis Date  . Arthritis   . Breast cancer metastasized to axillary lymph node ([place]) 11/02/2015  . Diabetes mellitus without complication ([place]) 2952   oral meds until Victoza in 07/2012, now change to Invokana  . Hypertension 2006  . Menopausal state   . Skin cancer    Gina Navarro  PAST SURGICAL HISTORY: Past Surgical History:  Procedure Laterality Date  . MOHS SURGERY  2002   chin x 1 and right forehead X 1 in 2004  . TONSILLECTOMY AND ADENOIDECTOMY      FAMILY HISTORY Family History  Problem Relation Age of Onset  . Adopted: Yes  . Liver cancer Mother   . Lung cancer Mother   . Brain cancer Father   The patient is adopted and knows little about her biological family. Both are adopted parents have died. Her adopted Brother (not biologically related) Gina Navarro, lives in [place].  GYNECOLOGIC HISTORY:  Patient's last menstrual period was 05/13/1996. Menarche  age 27, the patient is GX P0. She went through menopause in her early 85s and did not take hormone replacement. She never used oral contraceptives.  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 She is contracted by group who contracts to the state. She is single and lives alone with 4 dogs. She has a large fasting yard. She attends the [employer]    ADVANCED DIRECTIVES: The patient completed a healthcare part of attorney document and a copy was filed to be scanned 12/01/2015. She has named [employer] representative as her healthcare power of attorney. She can be reached at [place]: Social History  Substance Use Topics  . Smoking status: Former Smoker    Packs/day: 1.00    Years: 5.00    Types: Cigarettes  . Smokeless tobacco: Never Used  . Alcohol use No     Colonoscopy: Never  PAP:  Bone density: Never   Allergies  Allergen Reactions  . Bee Venom   . Statins Other (See Comments)    MYALGIAS with Crestor, Zocor. Tolerating Lipitor (05/2013)  . Victoza [Liraglutide]     Dizziness, peripheral neuropathy, constipation, heart palpitations    Current Outpatient Prescriptions  Medication Sig Dispense Refill  . Blood Glucose Monitoring Suppl (BAYER CONTOUR MONITOR) w/Device KIT Test blood sugar once daily. Dx: E11.40 1 kit 0  . canagliflozin (INVOKANA) 300 MG TABS tablet Take 1 tablet (300 mg total) by mouth daily before breakfast. 90 tablet 3  . cholecalciferol (VITAMIN D) 1000 UNITS tablet Take 1,000 Units by mouth daily.    Marland Kitchen dexamethasone (DECADRON) 4 MG tablet Take 2 tablets by mouth once a day on the day after chemotherapy and then take 2 tablets two times a day for 2 days. Take with food. 30 tablet 1  . glucosamine-chondroitin 500-400 MG tablet Take 1 tablet by mouth 3 (three) times daily.    Marland Kitchen glucose blood (BAYER CONTOUR NEXT TEST) test strip Test blood sugar once daily. Dx: E11.40 100 each 2  . lidocaine-prilocaine (EMLA) cream Apply to port 1-2 hours before procedure 30 g 1  . lisinopril-hydrochlorothiazide  (PRINZIDE,ZESTORETIC) 20-25 MG tablet TAKE 1 TABLET BY MOUTH DAILY 90 tablet 3  . LORazepam (ATIVAN) 0.5 MG tablet Take 1 tablet (0.5 mg total) by mouth at bedtime as needed (Nausea or vomiting). 30 tablet 0  . metFORMIN (GLUCOPHAGE) 1000 MG tablet Take 1 tablet (1,000 mg total)  By mouth 2 (two) times daily with a meal 180 tablet 3  . nitroGLYCERIN (NITROSTAT) 0.4 MG SL tablet Place 1 tablet (0.4 mg total) under the tongue every 5 (five) minutes as needed for chest pain. 25 tablet 1  . prochlorperazine (COMPAZINE) 10 MG tablet Take 1 tablet (10 mg total) by mouth every 6 (six) hours as needed (Nausea or vomiting). 30 tablet 1   No current facility-administered medications for this visit.    Facility-Administered Medications Ordered in Other Visits  Medication Dose Route Frequency Provider Last Rate Last Dose  . sodium chloride flush (NS) 0.9 % injection 10 mL  10 mL Intravenous PRN Chauncey Cruel, MD   10 mL at 12/22/15 2355    OBJECTIVE: Middle-aged white woman Who appears stated age Vitals:   12/22/15 0859  BP: (!) 142/53  Pulse: 95  Resp: 18  Temp: 98.4 F (36.9 C)     Body  mass index is 29.68 kg/m.    ECOG FS:1 - Symptomatic but completely ambulatory  Sclerae unicteric, EOMs intact Oropharynx clear and moist No cervical or supraclavicular adenopathy Lungs no rales or rhonchi Heart regular rate and rhythm Abd soft, nontender, positive bowel sounds MSK no focal spinal tenderness, no upper extremity lymphedema Neuro: nonfocal, well oriented, appropriate affect Breasts: Deferred     LAB RESULTS:  CMP     Component Value Date/Time   NA 137 12/08/2015 0819   K 4.1 12/08/2015 0819   CL 109 11/22/2015 0845   CO2 23 12/08/2015 0819   GLUCOSE 225 (H) 12/08/2015 0819   BUN 19.0 12/08/2015 0819   CREATININE 1.2 (H) 12/08/2015 0819   CALCIUM 9.6 12/08/2015 0819   PROT 7.1 12/08/2015 0819   ALBUMIN 4.0 12/08/2015 0819   AST 16 12/08/2015 0819   ALT 24 12/08/2015 0819    ALKPHOS 81 12/08/2015 0819   BILITOT 0.44 12/08/2015 0819   GFRNONAA 46 (L) 11/22/2015 0845   GFRNONAA 67 08/31/2013 1007   GFRAA 53 (L) 11/22/2015 0845   GFRAA 77 08/31/2013 1007    INo results found for: SPEP, UPEP  Lab Results  Component Value Date   WBC 10.5 (H) 12/22/2015   NEUTROABS 9.4 (H) 12/22/2015   HGB 10.0 (L) 12/22/2015   HCT 29.8 (L) 12/22/2015   MCV 86.3 12/22/2015   PLT 192 12/22/2015      Chemistry      Component Value Date/Time   NA 137 12/08/2015 0819   K 4.1 12/08/2015 0819   CL 109 11/22/2015 0845   CO2 23 12/08/2015 0819   BUN 19.0 12/08/2015 0819   CREATININE 1.2 (H) 12/08/2015 0819      Component Value Date/Time   CALCIUM 9.6 12/08/2015 0819   ALKPHOS 81 12/08/2015 0819   AST 16 12/08/2015 0819   ALT 24 12/08/2015 0819   BILITOT 0.44 12/08/2015 0819       No results found for: LABCA2  No components found for: HLKTG256  No results for input(s): INR in the last 168 hours.  Urinalysis    Component Value Date/Time   BILIRUBINUR n 12/13/2014 1000   PROTEINUR n 12/13/2014 1000   UROBILINOGEN negative 12/13/2014 1000   NITRITE n 12/13/2014 1000   LEUKOCYTESUR Negative 11/05/2011 1201     STUDIES: Ir Fluoro Guide Cv Line Left  Result Date: 11/22/2015 INDICATION: 65 year old female with a history of right-sided breast carcinoma. She tells me she will have chemotherapy for 3-4 months of cycling, then with surgery at with future radiation therapy. Given the interval time for healing, right-sided port is reasonable. EXAM: IMPLANTED PORT A CATH PLACEMENT WITH ULTRASOUND AND FLUOROSCOPIC GUIDANCE MEDICATIONS: 2.0 g Ancef; The antibiotic was administered within an appropriate time interval prior to skin puncture. ANESTHESIA/SEDATION: Moderate (conscious) sedation was employed during this procedure. A total of Versed 1.5 mg and Fentanyl 75 mcg was administered intravenously. Moderate Sedation Time: 19 minutes. The patient's level of consciousness  and vital signs were monitored continuously by radiology nursing throughout the procedure under my direct supervision. FLUOROSCOPY TIME:  Zero minutes, 12 seconds (1 mGy) COMPLICATIONS: None PROCEDURE: The procedure, risks, benefits, and alternatives were explained to the patient. Questions regarding the procedure were encouraged and answered. The patient understands and consents to the procedure. Ultrasound survey was performed with images stored and sent to PACs. The right neck and chest was prepped with chlorhexidine, and draped in the usual sterile fashion using maximum barrier technique (cap and mask,  sterile gown, sterile gloves, large sterile sheet, hand hygiene and cutaneous antiseptic). Antibiotic prophylaxis was provided with 2.0g Ancef administered IV one hour prior to skin incision. Local anesthesia was attained by infiltration with 1% lidocaine without epinephrine. Ultrasound demonstrated patency of the right internal jugular vein, and this was documented with an image. Under real-time ultrasound guidance, this vein was accessed with a 21 gauge micropuncture needle and image documentation was performed. A small dermatotomy was made at the access site with an 11 scalpel. A 0.018" wire was advanced into the SVC and used to estimate the length of the internal catheter. The access needle exchanged for a 84F micropuncture vascular sheath. The 0.018" wire was then removed and a 0.035" wire advanced into the IVC. An appropriate location for the subcutaneous reservoir was selected below the clavicle and an incision was made through the skin and underlying soft tissues. The subcutaneous tissues were then dissected using a combination of blunt and sharp surgical technique and a pocket was formed. A single lumen power injectable portacatheter was then tunneled through the subcutaneous tissues from the pocket to the dermatotomy and the port reservoir placed within the subcutaneous pocket. The venous access site was  then serially dilated and a peel away vascular sheath placed over the wire. The wire was removed and the port catheter advanced into position under fluoroscopic guidance. The catheter tip is positioned in the cavoatrial junction. This was documented with a spot image. The portacatheter was then tested and found to flush and aspirate well. The port was flushed with saline followed by 100 units/mL heparinized saline. The pocket was then closed in two layers using first subdermal inverted interrupted absorbable sutures followed by a running subcuticular suture. The epidermis was then sealed with Dermabond. The dermatotomy at the venous access site was also seal with Dermabond. Patient tolerated the procedure well and remained hemodynamically stable throughout. No complications encountered and no significant blood loss encountered IMPRESSION: Status post right IJ port catheter placement. Catheter ready for use. Signed, Dulcy Fanny. Earleen Newport, DO [facility] Radiology Electronically Signed   By: Corrie Mckusick D.O.   On: 11/22/2015 16:17   Ir US Guide Vasc Access Right  Result Date: 11/22/2015 INDICATION: 65 year old female with a history of right-sided breast carcinoma. She tells me she will have chemotherapy for 3-4 months of cycling, then with surgery at with future radiation therapy. Given the interval time for healing, right-sided port is reasonable. EXAM: IMPLANTED PORT A CATH PLACEMENT WITH ULTRASOUND AND FLUOROSCOPIC GUIDANCE MEDICATIONS: 2.0 g Ancef; The antibiotic was administered within an appropriate time interval prior to skin puncture. ANESTHESIA/SEDATION: Moderate (conscious) sedation was employed during this procedure. A total of Versed 1.5 mg and Fentanyl 75 mcg was administered intravenously. Moderate Sedation Time: 19 minutes. The patient's level of consciousness and vital signs were monitored continuously by radiology nursing throughout the procedure under  my direct supervision. FLUOROSCOPY TIME:  Zero minutes, 12 seconds (1 mGy) COMPLICATIONS: None PROCEDURE: The procedure, risks, benefits, and alternatives were explained to the patient. Questions regarding the procedure were encouraged and answered. The patient understands and consents to the procedure. Ultrasound survey was performed with images stored and sent to PACs. The right neck and chest was prepped with chlorhexidine, and draped in the usual sterile fashion using maximum barrier technique (cap and mask, sterile gown, sterile gloves, large sterile sheet, hand hygiene and cutaneous antiseptic). Antibiotic prophylaxis was provided with 2.0g Ancef administered IV one hour prior to skin incision. Local  anesthesia was attained by infiltration with 1% lidocaine without epinephrine. Ultrasound demonstrated patency of the right internal jugular vein, and this was documented with an image. Under real-time ultrasound guidance, this vein was accessed with a 21 gauge micropuncture needle and image documentation was performed. A small dermatotomy was made at the access site with an 11 scalpel. A 0.018" wire was advanced into the SVC and used to estimate the length of the internal catheter. The access needle exchanged for a 79F micropuncture vascular sheath. The 0.018" wire was then removed and a 0.035" wire advanced into the IVC. An appropriate location for the subcutaneous reservoir was selected below the clavicle and an incision was made through the skin and underlying soft tissues. The subcutaneous tissues were then dissected using a combination of blunt and sharp surgical technique and a pocket was formed. A single lumen power injectable portacatheter was then tunneled through the subcutaneous tissues from the pocket to the dermatotomy and the port reservoir placed within the subcutaneous pocket. The venous access site was then serially dilated and a peel away vascular sheath placed over the wire. The wire was removed  and the port catheter advanced into position under fluoroscopic guidance. The catheter tip is positioned in the cavoatrial junction. This was documented with a spot image. The portacatheter was then tested and found to flush and aspirate well. The port was flushed with saline followed by 100 units/mL heparinized saline. The pocket was then closed in two layers using first subdermal inverted interrupted absorbable sutures followed by a running subcuticular suture. The epidermis was then sealed with Dermabond. The dermatotomy at the venous access site was also seal with Dermabond. Patient tolerated the procedure well and remained hemodynamically stable throughout. No complications encountered and no significant blood loss encountered IMPRESSION: Status post right IJ port catheter placement. Catheter ready for use. Signed, Dulcy Fanny. Earleen Newport, DO [facility] Radiology Electronically Signed   By: Corrie Mckusick D.O.   On: 11/22/2015 16:17    ELIGIBLE FOR AVAILABLE RESEARCH PROTOCOL: no  ASSESSMENT: 65 y.o. [place] woman status post right breast axillary tail lymph node biopsy 10/25/2015 for a clinical  TX N2, stage IIIA invasive ductal carcinoma, estrogen and progesterone receptor positive, HER-2 nonamplified   (1) neoadjuvant chemotherapy started 11/23/2015 consisting of cyclophosphamide and doxorubicin in dose dense fashion 4,  to be followed by weekly paclitaxel 12  (2) axillary lymph node dissection to follow chemotherapy  (3) adjuvant radiation to follow surgery  (4) anti-estrogens to follow at the completion of local treatment  (5) genetics testing pending    PLAN: Gina Navarro is will receive the third of her 4 planned cycles of dose dense doxorubicin and cyclophosphamide today. She is doing well with the treatments and I am making no changes in dosage or interval.  She is moderately anemic. This is expected at this point. It requires no intervention  so far.  We discussed her cold, which is really more of an allergy issue him a and I suggested she go ahead and take her Claritin daily for the next 2-3 weeks and not just for 2 days as scheduled for these bony pains due to Neulasta. I also suggested she start having lots of hot liquids mostly herbal teas. His is not something she enjoys doing but she will give it a try.  Otherwise she will see me again in 2 weeks with her final dose of doxorubicin and cyclophosphamide. She knows to call for any problems that may develop  before that visit. Chauncey Cruel, MD   12/22/2015 9:40 AM Medical Oncology and Hematology [facility] [address] [place], [place] [address] Tel. [phone]    Fax. [phone]

## 2015-12-23 ENCOUNTER — Ambulatory Visit: Payer: BLUE CROSS/BLUE SHIELD

## 2016-01-04 ENCOUNTER — Other Ambulatory Visit: Payer: Self-pay | Admitting: *Deleted

## 2016-01-04 DIAGNOSIS — C773 Secondary and unspecified malignant neoplasm of axilla and upper limb lymph nodes: Principal | ICD-10-CM

## 2016-01-04 DIAGNOSIS — C50919 Malignant neoplasm of unspecified site of unspecified female breast: Secondary | ICD-10-CM

## 2016-01-05 ENCOUNTER — Ambulatory Visit (HOSPITAL_BASED_OUTPATIENT_CLINIC_OR_DEPARTMENT_OTHER): Payer: BLUE CROSS/BLUE SHIELD

## 2016-01-05 ENCOUNTER — Ambulatory Visit: Payer: BLUE CROSS/BLUE SHIELD

## 2016-01-05 ENCOUNTER — Ambulatory Visit (HOSPITAL_BASED_OUTPATIENT_CLINIC_OR_DEPARTMENT_OTHER): Payer: BLUE CROSS/BLUE SHIELD | Admitting: Oncology

## 2016-01-05 VITALS — BP 137/74 | HR 96 | Temp 99.0°F | Resp 18 | Wt 170.4 lb

## 2016-01-05 DIAGNOSIS — C773 Secondary and unspecified malignant neoplasm of axilla and upper limb lymph nodes: Secondary | ICD-10-CM

## 2016-01-05 DIAGNOSIS — C50919 Malignant neoplasm of unspecified site of unspecified female breast: Secondary | ICD-10-CM | POA: Diagnosis not present

## 2016-01-05 DIAGNOSIS — Z5189 Encounter for other specified aftercare: Secondary | ICD-10-CM | POA: Diagnosis not present

## 2016-01-05 DIAGNOSIS — C50611 Malignant neoplasm of axillary tail of right female breast: Secondary | ICD-10-CM

## 2016-01-05 DIAGNOSIS — Z170 Estrogen receptor positive status [ER+]: Secondary | ICD-10-CM | POA: Diagnosis not present

## 2016-01-05 DIAGNOSIS — Z5111 Encounter for antineoplastic chemotherapy: Secondary | ICD-10-CM | POA: Diagnosis not present

## 2016-01-05 DIAGNOSIS — Z95828 Presence of other vascular implants and grafts: Secondary | ICD-10-CM

## 2016-01-05 DIAGNOSIS — C50911 Malignant neoplasm of unspecified site of right female breast: Secondary | ICD-10-CM

## 2016-01-05 LAB — CBC WITH DIFFERENTIAL/PLATELET
BASO%: 0.9 % (ref 0.0–2.0)
BASOS ABS: 0.1 10*3/uL (ref 0.0–0.1)
EOS%: 0.4 % (ref 0.0–7.0)
Eosinophils Absolute: 0 10*3/uL (ref 0.0–0.5)
HEMATOCRIT: 27.2 % — AB (ref 34.8–46.6)
HEMOGLOBIN: 9.2 g/dL — AB (ref 11.6–15.9)
LYMPH#: 0.5 10*3/uL — AB (ref 0.9–3.3)
LYMPH%: 5.7 % — ABNORMAL LOW (ref 14.0–49.7)
MCH: 29.8 pg (ref 25.1–34.0)
MCHC: 33.7 g/dL (ref 31.5–36.0)
MCV: 88.4 fL (ref 79.5–101.0)
MONO#: 0.5 10*3/uL (ref 0.1–0.9)
MONO%: 6.6 % (ref 0.0–14.0)
NEUT%: 86.4 % — ABNORMAL HIGH (ref 38.4–76.8)
NEUTROS ABS: 7.1 10*3/uL — AB (ref 1.5–6.5)
Platelets: 204 10*3/uL (ref 145–400)
RBC: 3.08 10*6/uL — ABNORMAL LOW (ref 3.70–5.45)
RDW: 14.4 % (ref 11.2–14.5)
WBC: 8.2 10*3/uL (ref 3.9–10.3)

## 2016-01-05 LAB — COMPREHENSIVE METABOLIC PANEL
ALK PHOS: 78 U/L (ref 40–150)
ALT: 19 U/L (ref 0–55)
AST: 15 U/L (ref 5–34)
Albumin: 3.6 g/dL (ref 3.5–5.0)
Anion Gap: 12 mEq/L — ABNORMAL HIGH (ref 3–11)
BUN: 19.2 mg/dL (ref 7.0–26.0)
CALCIUM: 9.7 mg/dL (ref 8.4–10.4)
CO2: 23 mEq/L (ref 22–29)
CREATININE: 1.2 mg/dL — AB (ref 0.6–1.1)
Chloride: 103 mEq/L (ref 98–109)
EGFR: 50 mL/min/{1.73_m2} — ABNORMAL LOW (ref >90)
GLUCOSE: 207 mg/dL — AB (ref 70–140)
Potassium: 4 mEq/L (ref 3.5–5.1)
Sodium: 138 mEq/L (ref 136–145)
TOTAL PROTEIN: 6.6 g/dL (ref 6.4–8.3)
Total Bilirubin: 0.41 mg/dL (ref 0.20–1.20)

## 2016-01-05 MED ORDER — PALONOSETRON HCL INJECTION 0.25 MG/5ML
0.2500 mg | Freq: Once | INTRAVENOUS | Status: AC
  Administered 2016-01-05: 0.25 mg via INTRAVENOUS

## 2016-01-05 MED ORDER — PALONOSETRON HCL INJECTION 0.25 MG/5ML
INTRAVENOUS | Status: AC
  Filled 2016-01-05: qty 5

## 2016-01-05 MED ORDER — DOXORUBICIN HCL CHEMO IV INJECTION 2 MG/ML
60.0000 mg/m2 | Freq: Once | INTRAVENOUS | Status: AC
  Administered 2016-01-05: 114 mg via INTRAVENOUS
  Filled 2016-01-05: qty 57

## 2016-01-05 MED ORDER — SODIUM CHLORIDE 0.9 % IV SOLN
Freq: Once | INTRAVENOUS | Status: AC
  Administered 2016-01-05: 11:00:00 via INTRAVENOUS
  Filled 2016-01-05: qty 5

## 2016-01-05 MED ORDER — HEPARIN SOD (PORK) LOCK FLUSH 100 UNIT/ML IV SOLN
500.0000 [IU] | Freq: Once | INTRAVENOUS | Status: AC | PRN
  Administered 2016-01-05: 500 [IU]
  Filled 2016-01-05: qty 5

## 2016-01-05 MED ORDER — SODIUM CHLORIDE 0.9 % IJ SOLN
10.0000 mL | INTRAMUSCULAR | Status: DC | PRN
  Administered 2016-01-05: 10 mL via INTRAVENOUS
  Filled 2016-01-05: qty 10

## 2016-01-05 MED ORDER — SODIUM CHLORIDE 0.9 % IV SOLN
600.0000 mg/m2 | Freq: Once | INTRAVENOUS | Status: AC
  Administered 2016-01-05: 1140 mg via INTRAVENOUS
  Filled 2016-01-05: qty 57

## 2016-01-05 MED ORDER — SODIUM CHLORIDE 0.9% FLUSH
10.0000 mL | INTRAVENOUS | Status: DC | PRN
  Administered 2016-01-05: 10 mL
  Filled 2016-01-05: qty 10

## 2016-01-05 MED ORDER — AZITHROMYCIN 1 G PO PACK: 1.0000 g | PACK | Freq: Once | ORAL | 0 refills | Status: AC

## 2016-01-05 MED ORDER — PEGFILGRASTIM 6 MG/0.6ML ~~LOC~~ PSKT
6.0000 mg | PREFILLED_SYRINGE | Freq: Once | SUBCUTANEOUS | Status: AC
  Administered 2016-01-05: 6 mg via SUBCUTANEOUS
  Filled 2016-01-05: qty 0.6

## 2016-01-05 MED ORDER — SODIUM CHLORIDE 0.9 % IV SOLN
Freq: Once | INTRAVENOUS | Status: AC
  Administered 2016-01-05: 11:00:00 via INTRAVENOUS

## 2016-01-05 NOTE — Patient Instructions (Signed)
[place] Discharge Instructions for Patients Receiving Chemotherapy  Today you received the following chemotherapy agents adriamycin/herceptin  To help prevent nausea and vomiting after your treatment, we encourage you to take your nausea medication as directed.    If you develop nausea and vomiting that is not controlled by your nausea medication, call the clinic.   BELOW ARE SYMPTOMS THAT SHOULD BE REPORTED IMMEDIATELY:  *FEVER GREATER THAN 100.5 F  *CHILLS WITH OR WITHOUT FEVER  NAUSEA AND VOMITING THAT IS NOT CONTROLLED WITH YOUR NAUSEA MEDICATION  *UNUSUAL SHORTNESS OF BREATH  *UNUSUAL BRUISING OR BLEEDING  TENDERNESS IN MOUTH AND THROAT WITH OR WITHOUT PRESENCE OF ULCERS  *URINARY PROBLEMS  *BOWEL PROBLEMS  UNUSUAL RASH Items with * indicate a potential emergency and should be followed up as soon as possible.  Feel free to call the clinic you have any questions or concerns. The clinic phone number is [phone].

## 2016-01-05 NOTE — Progress Notes (Signed)
[place]  [phone]) [phone] [phone]     ID: Gina Navarro DOB: [birth year]  MR#: [id number]  HAL#:[id number]  Patient Care Team: Gina Mons, PA-C as PCP - General (Physician Assistant) Gina Pigg, MD as Attending Physician (Dermatology) Gina Boroughs, FNP as Nurse Practitioner (Obstetrics and Gynecology) Gina Prows, MD as Consulting Physician (Cardiology) Gina Luna, MD as Consulting Physician (General Surgery) Gina Cruel, MD as Consulting Physician (Oncology) Gina Gibson, MD as Attending Physician (Radiation Oncology) PCP: Gina Mons, PA-C Gina Cruel, MD OTHER MD:  CHIEF COMPLAINT: Estrogen receptor positive breast cancer  CURRENT TREATMENT: Neoadjuvant chemotherapy   BREAST CANCER HISTORY: From the original intake note:  "Gina Navarro" noted a mass in her right axilla 10/20/2015 and brought it to medical attention. She was scheduled for bilateral diagnostic mammography with tomography at [facility] 10/24/2015. The breast composition was category B. Mammogram of the breast showed no suspicious masses. However there was palpable right axillary adenopathy, more specifically a 5 cm "lump beneath the anterior right axilla, and ultrasound was obtained.. On ultrasound, the breast itself was unremarkable. However there were multiple enlarged right axillary lymph nodes the largest measuring 5.7 cm.   Biopsy of one of the enlarged right axillary lymph nodes on 10/25/2015 showed metastatic carcinoma, which was cytokeratin 7 positive, estrogen receptor 95% positive, progesterone receptor 10% positive, both with strong staining intensity, and HER-2 nonamplified, with a signals ratio 1.62, and the number percell 2.10.  MRI of the breasts 11/06/2015 again showed no mass or abnormal enhancement in the right breast or left breast. In the right axilla there were 6 enlarged grossly abnormal lymph nodes.  The patient's subsequent history is as detailed  below  INTERVAL HISTORY: Gina Navarro returns today for treatment of her estrogen receptor positive breast cancer. Today is day 1 cycle 4 of 4 planned cycles of cyclophosphamide and doxorubicin, to be followed by weekly Abraxane 12.  REVIEW OF SYSTEMS: Gina Navarro  feels a bit more fatigued and in a couple of occasions found that actually heart to get up from a chair. When she is walking she is fine, but when she stands she feels like she might drop. She has not had chest pain or pressure, or shortness of breath. She has had a persistent cough, with a bronchitic symptoms, which have been present for 3 weeks despite her treatment with Mucinex and Claritin. She is very ready to try an antibiotic. Aside from those symptoms, her blood sugars have been in the 200 range or so. He is managed to continue to work full-time. A detailed review of systems today was otherwise stable.  PAST MEDICAL HISTORY: Past Medical History:  Diagnosis Date  . Arthritis   . Breast cancer metastasized to axillary lymph node ([place]) 11/02/2015  . Diabetes mellitus without complication ([place]) 7353   oral meds until Victoza in 07/2012, now change to Invokana  . Hypertension 2006  . Menopausal state   . Skin cancer    [place]  PAST SURGICAL HISTORY: Past Surgical History:  Procedure Laterality Date  . MOHS SURGERY  2002   chin x 1 and right forehead X 1 in 2004  . TONSILLECTOMY AND ADENOIDECTOMY      FAMILY HISTORY Family History  Problem Relation Age of Onset  . Adopted: Yes  . Liver cancer Mother   . Lung cancer Mother   . Brain cancer Father   The patient is adopted and knows little about her biological family. Both are adopted parents have died. Her adopted  Brother (not biologically related) Lowry Ram, lives in [place].  GYNECOLOGIC HISTORY:  Patient's last menstrual period was 05/13/1996. Menarche age 54, the patient is GX P0. She went through menopause in her early 24s and did not take hormone replacement. She  never used oral contraceptives.  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 She is contracted by group who contracts to the state. She is single and lives alone with 4 dogs. She has a large fasting yard. She attends the [employer]    ADVANCED DIRECTIVES: The patient completed a healthcare part of attorney document and a copy was filed to be scanned 12/01/2015. She has named [employer] representative as her healthcare power of attorney. She can be reached at [place]: Social History  Substance Use Topics  . Smoking status: Former Smoker    Packs/day: 1.00    Years: 5.00    Types: Cigarettes  . Smokeless tobacco: Never Used  . Alcohol use No     Colonoscopy: Never  PAP:  Bone density: Never   Allergies  Allergen Reactions  . Bee Venom   . Statins Other (See Comments)    MYALGIAS with Crestor, Zocor. Tolerating Lipitor (05/2013)  . Victoza [Liraglutide]     Dizziness, peripheral neuropathy, constipation, heart palpitations    Current Outpatient Prescriptions  Medication Sig Dispense Refill  . Blood Glucose Monitoring Suppl (BAYER CONTOUR MONITOR) w/Device KIT Test blood sugar once daily. Dx: E11.40 1 kit 0  . canagliflozin (INVOKANA) 300 MG TABS tablet Take 1 tablet (300 mg total) by mouth daily before breakfast. 90 tablet 3  . cholecalciferol (VITAMIN D) 1000 UNITS tablet Take 1,000 Units by mouth daily.    Marland Kitchen dexamethasone (DECADRON) 4 MG tablet Take 2 tablets by mouth once a day on the day after chemotherapy and then take 2 tablets two times a day for 2 days. Take with food. 30 tablet 1  . glucosamine-chondroitin 500-400 MG tablet Take 1 tablet by mouth 3 (three) times daily.    Marland Kitchen glucose blood (BAYER CONTOUR NEXT TEST) test strip Test blood sugar once daily. Dx: E11.40 100 each 2  . lidocaine-prilocaine  (EMLA) cream Apply to port 1-2 hours before procedure 30 g 1  . lisinopril-hydrochlorothiazide (PRINZIDE,ZESTORETIC) 20-25 MG tablet TAKE 1 TABLET BY MOUTH DAILY 90 tablet 3  . LORazepam (ATIVAN) 0.5 MG tablet Take 1 tablet (0.5 mg total) by mouth at bedtime as needed (Nausea or vomiting). 30 tablet 0  . metFORMIN (GLUCOPHAGE) 1000 MG tablet Take 1 tablet (1,000 mg total)  By mouth 2 (two) times daily with a meal 180 tablet 3  . nitroGLYCERIN (NITROSTAT) 0.4 MG SL tablet Place 1 tablet (0.4 mg total) under the tongue every 5 (five) minutes as needed for chest pain. 25 tablet 1  . prochlorperazine (COMPAZINE) 10 MG tablet Take 1 tablet (10 mg total) by mouth every 6 (six) hours as needed (Nausea or vomiting). 30 tablet 1   No current facility-administered medications for this visit.     OBJECTIVE: Middle-aged white woman In no acute distress Vitals:   01/05/16 0858  BP: 137/74  Pulse: 96  Resp: 18  Temp: 99 F (37.2 C)     Body mass index is 29.71 kg/m.    ECOG FS:1 - Symptomatic but completely ambulatory  Sclerae unicteric, pupils round and equal Oropharynx clear and  moist-- no thrush or other lesions No cervical or supraclavicular adenopathy Lungs no rales or rhonchi Heart regular rate and rhythm Abd soft, nontender, positive bowel sounds MSK no focal spinal tenderness, no upper extremity lymphedema Neuro: nonfocal, well oriented, appropriate affect Breasts: Deferred     LAB RESULTS:  CMP     Component Value Date/Time   NA 133 (L) 12/22/2015 0848   K 4.0 12/22/2015 0848   CL 109 11/22/2015 0845   CO2 20 (L) 12/22/2015 0848   GLUCOSE 214 (H) 12/22/2015 0848   BUN 19.6 12/22/2015 0848   CREATININE 1.2 (H) 12/22/2015 0848   CALCIUM 9.0 12/22/2015 0848   PROT 6.9 12/22/2015 0848   ALBUMIN 3.7 12/22/2015 0848   AST 15 12/22/2015 0848   ALT 21 12/22/2015 0848   ALKPHOS 91 12/22/2015 0848   BILITOT 0.60 12/22/2015 0848   GFRNONAA 46 (L) 11/22/2015 0845   GFRNONAA 67  08/31/2013 1007   GFRAA 53 (L) 11/22/2015 0845   GFRAA 77 08/31/2013 1007    INo results found for: SPEP, UPEP  Lab Results  Component Value Date   WBC 8.2 01/05/2016   NEUTROABS 7.1 (H) 01/05/2016   HGB 9.2 (L) 01/05/2016   HCT 27.2 (L) 01/05/2016   MCV 88.4 01/05/2016   PLT 204 01/05/2016      Chemistry      Component Value Date/Time   NA 133 (L) 12/22/2015 0848   K 4.0 12/22/2015 0848   CL 109 11/22/2015 0845   CO2 20 (L) 12/22/2015 0848   BUN 19.6 12/22/2015 0848   CREATININE 1.2 (H) 12/22/2015 0848      Component Value Date/Time   CALCIUM 9.0 12/22/2015 0848   ALKPHOS 91 12/22/2015 0848   AST 15 12/22/2015 0848   ALT 21 12/22/2015 0848   BILITOT 0.60 12/22/2015 0848       No results found for: LABCA2  No components found for: LABCA125  No results for input(s): INR in the last 168 hours.  Urinalysis    Component Value Date/Time   BILIRUBINUR n 12/13/2014 1000   PROTEINUR n 12/13/2014 1000   UROBILINOGEN negative 12/13/2014 1000   NITRITE n 12/13/2014 1000   LEUKOCYTESUR Negative 11/05/2011 1201     STUDIES: No results found.  ELIGIBLE FOR AVAILABLE RESEARCH PROTOCOL: no  ASSESSMENT: 65 y.o. [place] woman status post right breast axillary tail lymph node biopsy 10/25/2015 for a clinical  TX N2, stage IIIA invasive ductal carcinoma, estrogen and progesterone receptor positive, HER-2 nonamplified   (1) neoadjuvant chemotherapy started 11/23/2015 consisting of cyclophosphamide and doxorubicin in dose dense fashion 4,  to be followed by weekly paclitaxel 12  (2) axillary lymph node dissection to follow chemotherapy  (3) adjuvant radiation to follow surgery  (4) anti-estrogens to follow at the completion of local treatment  (5) genetics testing pending    PLAN: Gina Navarro will complete her fourth cycle of before meals today. She is now moderately anemic and is having symptoms related to this. We discussed the possibility of transfusions, but she  very much once to avoid that and we are not obligated to transfuse since she is not having chest pain or pressure problems. However I am setting her up for repeat lab work in the middle of next week just to make sure things don't become much worse.  I am stopping her lisinopril. I think this will help her not feel like she might faint.  I am adding Zithromax to see if we can clear her upper  respiratory symptoms.  Otherwise she will see me again in 2 weeks. At that time we will start her on Abraxane which she will receive weekly for the next 12 weeks. The advantage of Abraxane of course is that we can avoid the Benadryl and Decadron. The Benadryl means that somebody has to drive her here in the Decadron means her blood sugar goes above 037 which is certainly something we want to aovid       certainly Gina Cruel, MD   01/05/2016 9:11 AM Medical Oncology and Hematology [facility] [place], [place] [address] Tel. [phone]    Fax. [phone]

## 2016-01-05 NOTE — Patient Instructions (Signed)

## 2016-01-06 ENCOUNTER — Ambulatory Visit: Payer: BLUE CROSS/BLUE SHIELD

## 2016-01-07 ENCOUNTER — Telehealth: Payer: Self-pay | Admitting: Oncology

## 2016-01-07 NOTE — Telephone Encounter (Signed)
Lvm advising lab appt 8/31 @ 3.45pm.

## 2016-01-11 ENCOUNTER — Telehealth: Payer: Self-pay | Admitting: *Deleted

## 2016-01-11 ENCOUNTER — Ambulatory Visit (HOSPITAL_BASED_OUTPATIENT_CLINIC_OR_DEPARTMENT_OTHER): Payer: BLUE CROSS/BLUE SHIELD

## 2016-01-11 DIAGNOSIS — Z452 Encounter for adjustment and management of vascular access device: Secondary | ICD-10-CM | POA: Diagnosis not present

## 2016-01-11 DIAGNOSIS — Z95828 Presence of other vascular implants and grafts: Secondary | ICD-10-CM

## 2016-01-11 DIAGNOSIS — C50919 Malignant neoplasm of unspecified site of unspecified female breast: Secondary | ICD-10-CM

## 2016-01-11 DIAGNOSIS — C773 Secondary and unspecified malignant neoplasm of axilla and upper limb lymph nodes: Secondary | ICD-10-CM | POA: Diagnosis not present

## 2016-01-11 LAB — CBC WITH DIFFERENTIAL/PLATELET
BASO%: 0 % (ref 0.0–2.0)
BASOS ABS: 0 10*3/uL (ref 0.0–0.1)
EOS ABS: 0 10*3/uL (ref 0.0–0.5)
EOS%: 1.1 % (ref 0.0–7.0)
HCT: 22.7 % — ABNORMAL LOW (ref 34.8–46.6)
HGB: 7.7 g/dL — ABNORMAL LOW (ref 11.6–15.9)
LYMPH%: 26.3 % (ref 14.0–49.7)
MCH: 29.6 pg (ref 25.1–34.0)
MCHC: 33.9 g/dL (ref 31.5–36.0)
MCV: 87.3 fL (ref 79.5–101.0)
MONO#: 0.1 10*3/uL (ref 0.1–0.9)
MONO%: 11.6 % (ref 0.0–14.0)
NEUT#: 0.6 10*3/uL — ABNORMAL LOW (ref 1.5–6.5)
NEUT%: 61 % (ref 38.4–76.8)
NRBC: 0 % (ref 0–0)
PLATELETS: 116 10*3/uL — AB (ref 145–400)
RBC: 2.6 10*6/uL — AB (ref 3.70–5.45)
RDW: 15.4 % — AB (ref 11.2–14.5)
WBC: 1 10*3/uL — ABNORMAL LOW (ref 3.9–10.3)
lymph#: 0.3 10*3/uL — ABNORMAL LOW (ref 0.9–3.3)

## 2016-01-11 LAB — COMPREHENSIVE METABOLIC PANEL
ALK PHOS: 89 U/L (ref 40–150)
ALT: 19 U/L (ref 0–55)
ANION GAP: 10 meq/L (ref 3–11)
AST: 11 U/L (ref 5–34)
Albumin: 3.3 g/dL — ABNORMAL LOW (ref 3.5–5.0)
BILIRUBIN TOTAL: 0.58 mg/dL (ref 0.20–1.20)
BUN: 24.5 mg/dL (ref 7.0–26.0)
CO2: 21 meq/L — AB (ref 22–29)
Calcium: 8.5 mg/dL (ref 8.4–10.4)
Chloride: 105 mEq/L (ref 98–109)
Creatinine: 1.1 mg/dL (ref 0.6–1.1)
EGFR: 54 mL/min/{1.73_m2} — AB (ref >90)
Glucose: 292 mg/dl — ABNORMAL HIGH (ref 70–140)
Potassium: 3.7 mEq/L (ref 3.5–5.1)
Sodium: 137 mEq/L (ref 136–145)
TOTAL PROTEIN: 5.9 g/dL — AB (ref 6.4–8.3)

## 2016-01-11 MED ORDER — HEPARIN SOD (PORK) LOCK FLUSH 100 UNIT/ML IV SOLN
500.0000 [IU] | Freq: Once | INTRAVENOUS | Status: AC | PRN
  Administered 2016-01-11: 500 [IU] via INTRAVENOUS
  Filled 2016-01-11: qty 5

## 2016-01-11 MED ORDER — SODIUM CHLORIDE 0.9 % IJ SOLN
10.0000 mL | INTRAMUSCULAR | Status: DC | PRN
  Administered 2016-01-11: 10 mL via INTRAVENOUS
  Filled 2016-01-11: qty 10

## 2016-01-11 MED ORDER — AZITHROMYCIN 250 MG PO TABS: ORAL_TABLET | ORAL | 0 refills | Status: DC

## 2016-01-12 ENCOUNTER — Other Ambulatory Visit: Payer: Self-pay | Admitting: *Deleted

## 2016-01-12 ENCOUNTER — Other Ambulatory Visit: Payer: Self-pay | Admitting: Oncology

## 2016-01-19 ENCOUNTER — Encounter (HOSPITAL_COMMUNITY): Payer: Self-pay | Admitting: Emergency Medicine

## 2016-01-19 ENCOUNTER — Emergency Department (HOSPITAL_COMMUNITY): Payer: BLUE CROSS/BLUE SHIELD

## 2016-01-19 ENCOUNTER — Ambulatory Visit (HOSPITAL_BASED_OUTPATIENT_CLINIC_OR_DEPARTMENT_OTHER): Payer: BLUE CROSS/BLUE SHIELD | Admitting: Oncology

## 2016-01-19 ENCOUNTER — Emergency Department (HOSPITAL_COMMUNITY)
Admit: 2016-01-19 | Discharge: 2016-01-19 | Disposition: A | Discharge status: Home / Self Care | Payer: BLUE CROSS/BLUE SHIELD | Attending: Emergency Medicine | Admitting: Emergency Medicine

## 2016-01-19 ENCOUNTER — Ambulatory Visit (HOSPITAL_BASED_OUTPATIENT_CLINIC_OR_DEPARTMENT_OTHER): Payer: BLUE CROSS/BLUE SHIELD

## 2016-01-19 ENCOUNTER — Other Ambulatory Visit: Payer: Self-pay

## 2016-01-19 ENCOUNTER — Ambulatory Visit: Payer: BLUE CROSS/BLUE SHIELD

## 2016-01-19 ENCOUNTER — Inpatient Hospital Stay (HOSPITAL_COMMUNITY)
Admission: EM | Admit: 2016-01-19 | Discharge: 2016-02-11 | DRG: 246 | Disposition: E | Payer: BLUE CROSS/BLUE SHIELD | Attending: Internal Medicine | Admitting: Internal Medicine

## 2016-01-19 ENCOUNTER — Other Ambulatory Visit: Payer: BLUE CROSS/BLUE SHIELD

## 2016-01-19 VITALS — BP 114/53 | HR 130 | Temp 98.3°F | Resp 18 | Ht 63.5 in | Wt 171.4 lb

## 2016-01-19 DIAGNOSIS — J90 Pleural effusion, not elsewhere classified: Secondary | ICD-10-CM

## 2016-01-19 DIAGNOSIS — J810 Acute pulmonary edema: Secondary | ICD-10-CM | POA: Diagnosis not present

## 2016-01-19 DIAGNOSIS — E1142 Type 2 diabetes mellitus with diabetic polyneuropathy: Secondary | ICD-10-CM | POA: Diagnosis present

## 2016-01-19 DIAGNOSIS — Z6830 Body mass index (BMI) 30.0-30.9, adult: Secondary | ICD-10-CM

## 2016-01-19 DIAGNOSIS — R079 Chest pain, unspecified: Secondary | ICD-10-CM

## 2016-01-19 DIAGNOSIS — R778 Other specified abnormalities of plasma proteins: Secondary | ICD-10-CM

## 2016-01-19 DIAGNOSIS — Z79899 Other long term (current) drug therapy: Secondary | ICD-10-CM | POA: Diagnosis not present

## 2016-01-19 DIAGNOSIS — D649 Anemia, unspecified: Secondary | ICD-10-CM

## 2016-01-19 DIAGNOSIS — Z87891 Personal history of nicotine dependence: Secondary | ICD-10-CM

## 2016-01-19 DIAGNOSIS — T451X5A Adverse effect of antineoplastic and immunosuppressive drugs, initial encounter: Secondary | ICD-10-CM | POA: Diagnosis present

## 2016-01-19 DIAGNOSIS — Z780 Asymptomatic menopausal state: Secondary | ICD-10-CM

## 2016-01-19 DIAGNOSIS — N183 Chronic kidney disease, stage 3 unspecified: Secondary | ICD-10-CM | POA: Diagnosis present

## 2016-01-19 DIAGNOSIS — R000 Tachycardia, unspecified: Secondary | ICD-10-CM

## 2016-01-19 DIAGNOSIS — C773 Secondary and unspecified malignant neoplasm of axilla and upper limb lymph nodes: Secondary | ICD-10-CM

## 2016-01-19 DIAGNOSIS — J9601 Acute respiratory failure with hypoxia: Secondary | ICD-10-CM

## 2016-01-19 DIAGNOSIS — D6481 Anemia due to antineoplastic chemotherapy: Secondary | ICD-10-CM | POA: Diagnosis not present

## 2016-01-19 DIAGNOSIS — I255 Ischemic cardiomyopathy: Secondary | ICD-10-CM | POA: Diagnosis present

## 2016-01-19 DIAGNOSIS — C50611 Malignant neoplasm of axillary tail of right female breast: Secondary | ICD-10-CM

## 2016-01-19 DIAGNOSIS — Z452 Encounter for adjustment and management of vascular access device: Secondary | ICD-10-CM

## 2016-01-19 DIAGNOSIS — I10 Essential (primary) hypertension: Secondary | ICD-10-CM

## 2016-01-19 DIAGNOSIS — I6523 Occlusion and stenosis of bilateral carotid arteries: Secondary | ICD-10-CM | POA: Diagnosis present

## 2016-01-19 DIAGNOSIS — C50911 Malignant neoplasm of unspecified site of right female breast: Secondary | ICD-10-CM | POA: Diagnosis not present

## 2016-01-19 DIAGNOSIS — I130 Hypertensive heart and chronic kidney disease with heart failure and stage 1 through stage 4 chronic kidney disease, or unspecified chronic kidney disease: Secondary | ICD-10-CM | POA: Diagnosis not present

## 2016-01-19 DIAGNOSIS — E785 Hyperlipidemia, unspecified: Secondary | ICD-10-CM | POA: Diagnosis present

## 2016-01-19 DIAGNOSIS — Z170 Estrogen receptor positive status [ER+]: Secondary | ICD-10-CM

## 2016-01-19 DIAGNOSIS — Z85828 Personal history of other malignant neoplasm of skin: Secondary | ICD-10-CM

## 2016-01-19 DIAGNOSIS — I214 Non-ST elevation (NSTEMI) myocardial infarction: Secondary | ICD-10-CM | POA: Diagnosis present

## 2016-01-19 DIAGNOSIS — I5043 Acute on chronic combined systolic (congestive) and diastolic (congestive) heart failure: Secondary | ICD-10-CM | POA: Diagnosis present

## 2016-01-19 DIAGNOSIS — C50919 Malignant neoplasm of unspecified site of unspecified female breast: Secondary | ICD-10-CM

## 2016-01-19 DIAGNOSIS — I25110 Atherosclerotic heart disease of native coronary artery with unstable angina pectoris: Secondary | ICD-10-CM | POA: Diagnosis not present

## 2016-01-19 DIAGNOSIS — Z888 Allergy status to other drugs, medicaments and biological substances status: Secondary | ICD-10-CM

## 2016-01-19 DIAGNOSIS — I472 Ventricular tachycardia: Secondary | ICD-10-CM | POA: Diagnosis not present

## 2016-01-19 DIAGNOSIS — Z959 Presence of cardiac and vascular implant and graft, unspecified: Secondary | ICD-10-CM

## 2016-01-19 DIAGNOSIS — Z7984 Long term (current) use of oral hypoglycemic drugs: Secondary | ICD-10-CM

## 2016-01-19 DIAGNOSIS — M7989 Other specified soft tissue disorders: Secondary | ICD-10-CM | POA: Diagnosis not present

## 2016-01-19 DIAGNOSIS — R7989 Other specified abnormal findings of blood chemistry: Secondary | ICD-10-CM | POA: Diagnosis not present

## 2016-01-19 DIAGNOSIS — I2510 Atherosclerotic heart disease of native coronary artery without angina pectoris: Secondary | ICD-10-CM | POA: Diagnosis present

## 2016-01-19 DIAGNOSIS — E1122 Type 2 diabetes mellitus with diabetic chronic kidney disease: Secondary | ICD-10-CM | POA: Diagnosis present

## 2016-01-19 DIAGNOSIS — R0602 Shortness of breath: Secondary | ICD-10-CM | POA: Diagnosis not present

## 2016-01-19 DIAGNOSIS — I469 Cardiac arrest, cause unspecified: Secondary | ICD-10-CM

## 2016-01-19 DIAGNOSIS — I25700 Atherosclerosis of coronary artery bypass graft(s), unspecified, with unstable angina pectoris: Secondary | ICD-10-CM

## 2016-01-19 DIAGNOSIS — R05 Cough: Secondary | ICD-10-CM | POA: Diagnosis not present

## 2016-01-19 DIAGNOSIS — Z95828 Presence of other vascular implants and grafts: Secondary | ICD-10-CM

## 2016-01-19 HISTORY — DX: Chronic kidney disease, stage 3 unspecified: N18.30

## 2016-01-19 HISTORY — DX: Anemia, unspecified: D64.9

## 2016-01-19 HISTORY — DX: Chronic kidney disease, stage 3 (moderate): N18.3

## 2016-01-19 HISTORY — DX: Type 2 diabetes mellitus without complications: E11.9

## 2016-01-19 LAB — COMPREHENSIVE METABOLIC PANEL
ALBUMIN: 3.8 g/dL (ref 3.5–5.0)
ALT: 24 U/L (ref 0–55)
ALT: 26 U/L (ref 14–54)
AST: 16 U/L (ref 5–34)
AST: 20 U/L (ref 15–41)
Albumin: 3.2 g/dL — ABNORMAL LOW (ref 3.5–5.0)
Alkaline Phosphatase: 83 U/L (ref 40–150)
Alkaline Phosphatase: 82 U/L (ref 38–126)
Anion Gap: 16 mEq/L — ABNORMAL HIGH (ref 3–11)
Anion gap: 14 (ref 5–15)
BUN: 20.8 mg/dL (ref 7.0–26.0)
BUN: 23 mg/dL — AB (ref 6–20)
CHLORIDE: 106 meq/L (ref 98–109)
CHLORIDE: 106 mmol/L (ref 101–111)
CO2: 16 meq/L — AB (ref 22–29)
CO2: 18 mmol/L — AB (ref 22–32)
Calcium: 8.9 mg/dL (ref 8.4–10.4)
Calcium: 8.8 mg/dL — ABNORMAL LOW (ref 8.9–10.3)
Creatinine, Ser: 1.21 mg/dL — ABNORMAL HIGH (ref 0.44–1.00)
Creatinine: 1.2 mg/dL — ABNORMAL HIGH (ref 0.6–1.1)
EGFR: 48 mL/min/{1.73_m2} — AB (ref >90)
GFR calc Af Amer: 54 mL/min — ABNORMAL LOW (ref >60)
GFR calc non Af Amer: 47 mL/min — ABNORMAL LOW (ref >60)
GLUCOSE: 239 mg/dL — AB (ref 70–140)
GLUCOSE: 185 mg/dL — AB (ref 65–99)
POTASSIUM: 4 meq/L (ref 3.5–5.1)
POTASSIUM: 4.2 mmol/L (ref 3.5–5.1)
SODIUM: 138 meq/L (ref 136–145)
SODIUM: 138 mmol/L (ref 135–145)
Total Bilirubin: 0.84 mg/dL (ref 0.20–1.20)
Total Bilirubin: 1.1 mg/dL (ref 0.3–1.2)
Total Protein: 6.6 g/dL (ref 6.4–8.3)
Total Protein: 6.9 g/dL (ref 6.5–8.1)

## 2016-01-19 LAB — I-STAT CHEM 8, ED
BUN: 19 mg/dL (ref 6–20)
Calcium, Ion: 1.09 mmol/L — ABNORMAL LOW (ref 1.15–1.40)
Chloride: 105 mmol/L (ref 101–111)
Creatinine, Ser: 1.1 mg/dL — ABNORMAL HIGH (ref 0.44–1.00)
Glucose, Bld: 178 mg/dL — ABNORMAL HIGH (ref 65–99)
HEMATOCRIT: 26 % — AB (ref 36.0–46.0)
HEMOGLOBIN: 8.8 g/dL — AB (ref 12.0–15.0)
Potassium: 4.1 mmol/L (ref 3.5–5.1)
SODIUM: 137 mmol/L (ref 135–145)
TCO2: 19 mmol/L (ref 0–100)

## 2016-01-19 LAB — CBC WITH DIFFERENTIAL/PLATELET
BASO%: 0.3 % (ref 0.0–2.0)
BASOS ABS: 0 10*3/uL (ref 0.0–0.1)
EOS%: 0 % (ref 0.0–7.0)
Eosinophils Absolute: 0 10*3/uL (ref 0.0–0.5)
HCT: 24.7 % — ABNORMAL LOW (ref 34.8–46.6)
HEMOGLOBIN: 8 g/dL — AB (ref 11.6–15.9)
LYMPH%: 2.1 % — AB (ref 14.0–49.7)
MCH: 29.5 pg (ref 25.1–34.0)
MCHC: 32.3 g/dL (ref 31.5–36.0)
MCV: 91.3 fL (ref 79.5–101.0)
MONO#: 0.7 10*3/uL (ref 0.1–0.9)
MONO%: 4.2 % (ref 0.0–14.0)
NEUT#: 14.7 10*3/uL — ABNORMAL HIGH (ref 1.5–6.5)
NEUT%: 93.4 % — AB (ref 38.4–76.8)
Platelets: 275 10*3/uL (ref 145–400)
RBC: 2.7 10*6/uL — AB (ref 3.70–5.45)
RDW: 17.7 % — AB (ref 11.2–14.5)
WBC: 15.7 10*3/uL — ABNORMAL HIGH (ref 3.9–10.3)
lymph#: 0.3 10*3/uL — ABNORMAL LOW (ref 0.9–3.3)

## 2016-01-19 LAB — I-STAT TROPONIN, ED: TROPONIN I, POC: 0.88 ng/mL — AB (ref 0.00–0.08)

## 2016-01-19 LAB — CBC
HCT: 25.4 % — ABNORMAL LOW (ref 36.0–46.0)
HEMOGLOBIN: 8.5 g/dL — AB (ref 12.0–15.0)
MCH: 30.4 pg (ref 26.0–34.0)
MCHC: 33.5 g/dL (ref 30.0–36.0)
MCV: 90.7 fL (ref 78.0–100.0)
PLATELETS: 295 10*3/uL (ref 150–400)
RBC: 2.8 MIL/uL — ABNORMAL LOW (ref 3.87–5.11)
RDW: 17.2 % — AB (ref 11.5–15.5)
WBC: 20.3 10*3/uL — AB (ref 4.0–10.5)

## 2016-01-19 LAB — CBG MONITORING, ED: GLUCOSE-CAPILLARY: 128 mg/dL — AB (ref 65–99)

## 2016-01-19 LAB — APTT: aPTT: 26 seconds (ref 24–36)

## 2016-01-19 LAB — TSH: TSH: 1.851 u[IU]/mL (ref 0.350–4.500)

## 2016-01-19 LAB — GLUCOSE, CAPILLARY
GLUCOSE-CAPILLARY: 145 mg/dL — AB (ref 65–99)
Glucose-Capillary: 236 mg/dL — ABNORMAL HIGH (ref 65–99)

## 2016-01-19 LAB — PROTIME-INR
INR: 1.09
PROTHROMBIN TIME: 14.2 s (ref 11.4–15.2)

## 2016-01-19 LAB — TROPONIN I: TROPONIN I: 1.22 ng/mL — AB (ref <0.03)

## 2016-01-19 LAB — HEPARIN LEVEL (UNFRACTIONATED): HEPARIN UNFRACTIONATED: 0.13 [IU]/mL — AB (ref 0.30–0.70)

## 2016-01-19 LAB — LIPASE, BLOOD: Lipase: 21 U/L (ref 11–51)

## 2016-01-19 MED ORDER — ACETAMINOPHEN 500 MG PO TABS: 1000.0000 mg | ORAL_TABLET | ORAL | Status: AC

## 2016-01-19 MED ORDER — SODIUM CHLORIDE 0.9 % IV SOLN
INTRAVENOUS | Status: DC
  Administered 2016-01-19: 12:00:00 via INTRAVENOUS

## 2016-01-19 MED ORDER — CELECOXIB 400 MG PO CAPS
400.0000 mg | ORAL_CAPSULE | ORAL | Status: AC
  Filled 2016-01-19: qty 1

## 2016-01-19 MED ORDER — ACETAMINOPHEN 325 MG PO TABS: 650.0000 mg | ORAL_TABLET | ORAL | Status: DC | PRN

## 2016-01-19 MED ORDER — HEPARIN SOD (PORK) LOCK FLUSH 100 UNIT/ML IV SOLN: 500.0000 [IU] | Freq: Once | INTRAVENOUS | Status: DC

## 2016-01-19 MED ORDER — NITROGLYCERIN 0.4 MG SL SUBL: 0.4000 mg | SUBLINGUAL_TABLET | SUBLINGUAL | Status: DC | PRN

## 2016-01-19 MED ORDER — HEPARIN (PORCINE) IN NACL 100-0.45 UNIT/ML-% IJ SOLN
1400.0000 [IU]/h | INTRAMUSCULAR | Status: DC
  Administered 2016-01-19 (×2): 850 [IU]/h via INTRAVENOUS
  Administered 2016-01-21: 1400 [IU]/h via INTRAVENOUS
  Filled 2016-01-19 (×4): qty 250

## 2016-01-19 MED ORDER — INSULIN ASPART 100 UNIT/ML ~~LOC~~ SOLN
0.0000 [IU] | Freq: Three times a day (TID) | SUBCUTANEOUS | Status: DC
  Administered 2016-01-20: 2 [IU] via SUBCUTANEOUS
  Administered 2016-01-20 – 2016-01-21 (×3): 1 [IU] via SUBCUTANEOUS
  Administered 2016-01-21: 2 [IU] via SUBCUTANEOUS
  Administered 2016-01-21: 1 [IU] via SUBCUTANEOUS
  Administered 2016-01-22 – 2016-01-23 (×4): 2 [IU] via SUBCUTANEOUS

## 2016-01-19 MED ORDER — ONDANSETRON HCL 4 MG/2ML IJ SOLN: 4.0000 mg | Freq: Four times a day (QID) | INTRAMUSCULAR | Status: DC | PRN

## 2016-01-19 MED ORDER — CHLORHEXIDINE GLUCONATE CLOTH 2 % EX PADS: 6.0000 | MEDICATED_PAD | Freq: Once | CUTANEOUS | Status: DC

## 2016-01-19 MED ORDER — GABAPENTIN 300 MG PO CAPS: 300.0000 mg | ORAL_CAPSULE | ORAL | Status: AC

## 2016-01-19 MED ORDER — HEPARIN BOLUS VIA INFUSION
4000.0000 [IU] | Freq: Once | INTRAVENOUS | Status: AC
  Administered 2016-01-19: 4000 [IU] via INTRAVENOUS
  Filled 2016-01-19: qty 4000

## 2016-01-19 MED ORDER — SODIUM CHLORIDE 0.9 % IJ SOLN
10.0000 mL | INTRAMUSCULAR | Status: DC | PRN
  Administered 2016-01-19: 10 mL via INTRAVENOUS
  Filled 2016-01-19: qty 10

## 2016-01-19 MED ORDER — DEXTROSE 5 % IV SOLN
3.0000 g | INTRAVENOUS | Status: AC
  Filled 2016-01-19: qty 3000

## 2016-01-19 MED ORDER — IOPAMIDOL (ISOVUE-370) INJECTION 76%
100.0000 mL | Freq: Once | INTRAVENOUS | Status: AC | PRN
  Administered 2016-01-19: 52 mL via INTRAVENOUS

## 2016-01-19 MED ORDER — ASPIRIN 81 MG PO CHEW
324.0000 mg | CHEWABLE_TABLET | Freq: Once | ORAL | Status: AC
  Administered 2016-01-19: 324 mg via ORAL
  Filled 2016-01-19: qty 4

## 2016-01-19 MED ORDER — POTASSIUM CHLORIDE CRYS ER 20 MEQ PO TBCR
20.0000 meq | EXTENDED_RELEASE_TABLET | Freq: Once | ORAL | Status: AC
  Administered 2016-01-19: 20 meq via ORAL
  Filled 2016-01-19: qty 1

## 2016-01-19 MED ORDER — FUROSEMIDE 10 MG/ML IJ SOLN
80.0000 mg | Freq: Once | INTRAMUSCULAR | Status: AC
  Administered 2016-01-19: 80 mg via INTRAVENOUS
  Filled 2016-01-19: qty 8

## 2016-01-19 MED ORDER — ASPIRIN EC 81 MG PO TBEC
81.0000 mg | DELAYED_RELEASE_TABLET | Freq: Every day | ORAL | Status: DC
  Administered 2016-01-20 – 2016-01-21 (×2): 81 mg via ORAL
  Filled 2016-01-19 (×3): qty 1

## 2016-01-19 NOTE — Progress Notes (Signed)
VASCULAR LAB PRELIMINARY  PRELIMINARY  PRELIMINARY  PRELIMINARY  Bilateral lower extremity venous duplex completed.    Preliminary report:  Bilateral:  No evidence of DVT, superficial thrombosis, or Baker's Cyst.   Carolee Channell, RVS 01/30/2016, 11:39 AM

## 2016-01-19 NOTE — ED Triage Notes (Signed)
Pt scheduled for chemotherapy this morning and sent here for evaluation of intermittent central chest pain for few days with activity.

## 2016-01-19 NOTE — H&P (Signed)
History and Physical    Gina Navarro JKD:[id number] DOB: 02-27-1951 DOA: 01/29/2016  PCP: Harrison Mons, PA-C  Outpatient Specialists: Oncology, Dr. Jana Hakim Patient coming from: home  Chief Complaint: chest pain and difficulties breathing  HPI: Gina Navarro is a 65 y.o. female with medical history significant of recently diagnosed metastatic breast cancer in June 2017 s/p chemotherapy with cyclophosphamide and doxorubicin x 4 finished 01/05/16, now was about to start cycle 1 of 12 planned cycles of weekly Abraxane 12 is being brought from cancer center with complaints of chest pain and shortness of breath. Patient tells me that over the past week she has been experiencing exertional dyspnea and chest tightness "everytime she does something", and it is relieved by sitting down. Her chest pain is very predictable. She also has noticed bilateral LE swelling left >> right. She complains of occasional palpitations, most recent episode last night. No fever or chills. She has been having a dry cough for few weeks. No abdominal pain, nausea or vomiting. Had 1-2 episodes of loose stools overnight. No lightheadedness or dizziness.  ED Course: in the ED patient has stable vital signs, slightly tachycardic to 110-120, labs pertinent for Cr 1.2, WBC 20, Hb 8.8 and a positive troponin of 0.88. CT angio of the chest without PE, bilateral pleural effusions, R>L, coronary artery calcifications. Chest XR with findings of CHF.  Review of Systems: As per HPI otherwise 10 point review of systems negative.   Past Medical History:  Diagnosis Date  . Arthritis   . Breast cancer metastasized to axillary lymph node ([place]) 11/02/2015  . Diabetes mellitus without complication ([place]) 0998   oral meds until Victoza in 07/2012, now change to Invokana  . Hypertension 2006  . Menopausal state   . Skin cancer     Past Surgical History:  Procedure Laterality Date  . MOHS SURGERY  2002   chin x 1 and right forehead X 1 in  2004  . TONSILLECTOMY AND ADENOIDECTOMY       reports that she has quit smoking. Her smoking use included Cigarettes. She has a 5.00 pack-year smoking history. She has never used smokeless tobacco. She reports that she does not drink alcohol or use drugs.  Allergies  Allergen Reactions  . Bee Venom   . Statins Other (See Comments)    MYALGIAS with Crestor, Zocor. Tolerating Lipitor (05/2013)  . Victoza [Liraglutide]     Dizziness, peripheral neuropathy, constipation, heart palpitations    Family History  Problem Relation Age of Onset  . Adopted: Yes  . Liver cancer Mother   . Lung cancer Mother   . Brain cancer Father     Prior to Admission medications   Medication Sig Start Date End Date Taking? Authorizing Provider  azithromycin (ZITHROMAX) 250 MG tablet 1 po QD x 4 days 01/11/16   Chauncey Cruel, MD  Blood Glucose Monitoring Suppl (BAYER CONTOUR MONITOR) w/Device KIT Test blood sugar once daily. Dx: E11.40 12/19/15   Harrison Mons, PA-C  canagliflozin (INVOKANA) 300 MG TABS tablet Take 1 tablet (300 mg total) by mouth daily before breakfast. 08/15/15   Harrison Mons, PA-C  cholecalciferol (VITAMIN D) 1000 UNITS tablet Take 1,000 Units by mouth daily.    Historical Provider, MD  glucosamine-chondroitin 500-400 MG tablet Take 1 tablet by mouth 3 (three) times daily.    Historical Provider, MD  glucose blood (BAYER CONTOUR NEXT TEST) test strip Test blood sugar once daily. Dx: P38.25 12/19/15   Harrison Mons, PA-C  lidocaine-prilocaine (EMLA) cream Apply to port 1-2 hours before procedure 11/23/15   Chauncey Cruel, MD  metFORMIN (GLUCOPHAGE) 1000 MG tablet Take 1 tablet (1,000 mg total)  By mouth 2 (two) times daily with a meal 08/15/15   Chelle Jeffery, PA-C  nitroGLYCERIN (NITROSTAT) 0.4 MG SL tablet Place 1 tablet (0.4 mg total) under the tongue every 5 (five) minutes as needed for chest pain. 02/16/13   Harrison Mons, PA-C    Physical Exam: Vitals:   02/10/2016 1100 01/21/2016  1136 01/15/2016 1233 01/30/2016 1308  BP: 132/81 142/70 94/63 114/96  Pulse: (!) 123 116 112 120  Resp: (!) 29 22 (!) 28 (!) 35  Temp:      TempSrc:      SpO2: 96% 95% 100% 100%  Weight:          Constitutional: NAD, calm, comfortable Vitals:   01/18/2016 1100 02/02/2016 1136 01/30/2016 1233 01/15/2016 1308  BP: 132/81 142/70 94/63 114/96  Pulse: (!) 123 116 112 120  Resp: (!) 29 22 (!) 28 (!) 35  Temp:      TempSrc:      SpO2: 96% 95% 100% 100%  Weight:       Eyes: PERRL, lids and conjunctivae normal ENMT: Mucous membranes are moist. Posterior pharynx clear of any exudate or lesions.  Neck: normal, supple Respiratory: bibasilar crackles, no wheezing, no rhonchi. Moves air well Cardiovascular: Regular rate and rhythm, no murmurs / rubs / gallops. Tachycardic. 1+ LE edema on right, 1-2+ on left. + JVD Abdomen: no tenderness, no masses palpated. Bowel sounds positive.  Musculoskeletal: no clubbing / cyanosis. Normal muscle tone.  Skin: no rashes, lesions, ulcers. No induration Neurologic: CN 2-12 grossly intact. Strength 5/5 in all 4.  Psychiatric: Normal judgment and insight. Alert and oriented x 3. Normal mood.   Labs on Admission: I have personally reviewed following labs and imaging studies  CBC:  Recent Labs Lab 01/30/2016 0819 02/01/2016 1104 01/30/2016 1114  WBC 15.7* 20.3*  --   NEUTROABS 14.7*  --   --   HGB 8.0* 8.5* 8.8*  HCT 24.7* 25.4* 26.0*  MCV 91.3 90.7  --   PLT 275 295  --    Basic Metabolic Panel:  Recent Labs Lab 02/01/2016 0819 01/27/2016 1104 01/27/2016 1114  NA 138 138 137  K 4.0 4.2 4.1  CL  --  106 105  CO2 16* 18*  --   GLUCOSE 239* 185* 178*  BUN 20.8 23* 19  CREATININE 1.2* 1.21* 1.10*  CALCIUM 8.9 8.8*  --    GFR: Estimated Creatinine Clearance: 52.2 mL/min (by C-G formula based on SCr of 1.1 mg/dL). Liver Function Tests:  Recent Labs Lab 02/10/2016 0819 01/28/2016 1104  AST 16 20  ALT 24 26  ALKPHOS 83 82  BILITOT 0.84 1.1  PROT 6.6 6.9    ALBUMIN 3.2* 3.8    Recent Labs Lab 02/09/2016 1104  LIPASE 21   Urine analysis:    Component Value Date/Time   BILIRUBINUR n 12/13/2014 1000   PROTEINUR n 12/13/2014 1000   UROBILINOGEN negative 12/13/2014 1000   NITRITE n 12/13/2014 1000   LEUKOCYTESUR Negative 11/05/2011 1201   Sepsis Labs: @LABRCNTIP (procalcitonin:4,lacticidven:4) )No results found for this or any previous visit (from the past 240 hour(s)).   Radiological Exams on Admission: Dg Chest 2 View  Result Date: 01/12/2016 CLINICAL DATA:  Shortness of breath and cough for 5 days for history of breast carcinoma. EXAM: CHEST  2 VIEW COMPARISON:  Chest  CT November 17, 2015 FINDINGS: There is interstitial edema, primarily in the mid and lower lung zones bilaterally. There is a minimal pleural effusion on each side. There is mild cardiomegaly with pulmonary venous hypertension. Port-A-Cath tip is in the superior vena cava. No adenopathy. No bone lesions evident. There is calcification in the right carotid artery. IMPRESSION: Findings most consistent with a degree of congestive heart failure. It should be noted that lymphangitic spread of tumor could present in this manner and must be considered a differential consideration given history of breast carcinoma. Both entities could exist concurrently. There is no frank airspace consolidation. Port-A-Cath tip in superior vena cava. Right carotid artery calcification noted. Electronically Signed   By: Lowella Grip III M.D.   On: 01/29/2016 10:08   Ct Angio Chest Pe W And/or Wo Contrast  Result Date: 01/13/2016 CLINICAL DATA:  Chest pain, shortness of breath. History of breast cancer. EXAM: CT ANGIOGRAPHY CHEST WITH CONTRAST TECHNIQUE: Multidetector CT imaging of the chest was performed using the standard protocol during bolus administration of intravenous contrast. Multiplanar CT image reconstructions and MIPs were obtained to evaluate the vascular anatomy. CONTRAST:  52 mL of Isovue 370  intravenously. COMPARISON:  CT scan of November 17, 2015. FINDINGS: No pneumothorax is noted. Mild bilateral pleural effusions are noted, with right greater than left. Bibasilar interstitial densities are noted most consistent with pulmonary edema. There is no definite evidence of pulmonary embolus. Atherosclerosis of thoracic aorta is noted without aneurysm or dissection. Coronary artery calcifications are noted. 8 mm right peritracheal lymph node is noted which is significantly increased compared to prior exam. Also noted is 18 mm right sub carinal lymph node concerning for possible metastatic disease. 15 mm right axillary lymph node is noted which is decreased in size compared to prior exam but concerning for metastatic disease. There is been interval placement of right internal jugular Port-A-Cath with distal tip in expected position of cavoatrial junction. No definite abnormality is seen within the visualized portion of the upper abdomen. Multilevel degenerative disc disease is noted in the lower thoracic spine. Review of the MIP images confirms the above findings. IMPRESSION: No definite evidence of pulmonary embolus. Aortic atherosclerosis. Mild bilateral pleural effusions are noted, with right greater than left. Bibasilar interstitial densities are noted most consistent with pulmonary edema. Coronary artery calcifications are noted suggesting coronary disease. Enlarged right peritracheal and right sub carinal lymph nodes are noted concerning for possible metastatic disease. Right axillary adenopathy noted on prior exam is significantly smaller in size currently, consistent with improving metastatic disease. Electronically Signed   By: Marijo Conception, M.D.   On: 02/01/2016 12:39    EKG: Independently reviewed. Sinus rhythm, no STEMI, non specific ST segment changes V5-6  Assessment/Plan Active Problems:   Type 2 diabetes mellitus with peripheral neuropathy (HCC)   HTN (hypertension)   Hyperlipidemia LDL  goal <70   BMI 30.0-30.9,adult   Breast cancer metastasized to axillary lymph node (HCC)   NSTEMI (non-ST elevated myocardial infarction) (HCC)   CKD (chronic kidney disease), stage III  Chest pain with concern for NSTEMI - patient with typical symptoms with chest pain and dyspnea with activity and relieved by rest, for a week and with positive troponin in the ER - discussed with Dr. Harrington Challenger with cardiology who will see patient in consultation, recommended admission from [facility] ER to [facility], discussed with [facility]ist [facility]  - heparin gtt, cycle troponin x 3, keep NPO until  seen and evaluated by cardiology   Acute CHF, unknown type - clinically she is fluid overload, just received contrast, she is not hypoxic, hold Lasix which can worsen renal function. Defer Lasix use to cardiology once evaluated - 2D echo - concern for ischemic cardiomyopathy vs doxorubicin induced as she just completed chemo 2 weeks ago  CAD - observed on CT scan, patient was evaluated apparently with a stress test by Dr. Einar Gip 3-4 years ago. She would prefer not to see Dr. 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 Will repeat A1C - hold home medications, place pt on SSI  HLD - allergic to statins  HTN - monitor, normotensive in the ER     DVT prophylaxis: Heparin infusion  Code Status: Full  Family Communication: no family bedside, friend present Disposition Plan: admit to telemetry, [facility] Consults called: Cardiology, Dr. Harrington Challenger  Admission status: inpatient    Marzetta Board, MD [facility] Pager [phone]  If 7PM-7AM, please contact night-coverage [email or web address] Password [email or web address]  02/03/2016, 1:47 PM

## 2016-01-19 NOTE — ED Provider Notes (Signed)
[facility] DEPT Provider Note   CSN: [id number] Arrival date & time: 01/30/2016  0920     History   Chief Complaint Chief Complaint  Patient presents with  . Chest Pain    HPI Gina Navarro is a 65 y.o. female.   Chest Pain   The pain does not radiate. Associated symptoms include cough ( started abx recently ), lower extremity edema (left side started recently) and sputum production. Pertinent negatives include no fever. She has tried nothing for the symptoms. The treatment provided moderate relief.  Pertinent negatives for past medical history include no CAD, no DVT and no PE.  She started having pressure in chest mostly with exertion all over her chest.  It would resolve at rest but she felt a different type of discomfort in the upper abdomen when she would lie flat.  No history of heart disease but  She has had trouble in the past.  She saw Dr Einar Gip a couple of years ago.  She had some type of testing.  Her sx resolved after stopping victoza.  She has been fine since then until a few days ago when these sx started again.  Last night she had discomfort in the upper abdomen and chest last night.    Past Medical History:  Diagnosis Date  . Arthritis   . Breast cancer metastasized to axillary lymph node ([place]) 11/02/2015  . Diabetes mellitus without complication ([place]) 2376   oral meds until Victoza in 07/2012, now change to Invokana  . Hypertension 2006  . Menopausal state   . Skin cancer     Patient Active Problem List   Diagnosis Date Noted  . Port catheter in place 01/05/2016  . Cancer of axillary tail of right female breast ([place]) 11/08/2015  . Breast cancer metastasized to axillary lymph node ([place]) 11/02/2015  . BMI 30.0-30.9,adult 08/09/2014  . Carotid stenosis, bilateral 08/31/2013  . Exertional chest pain 08/31/2013  . Type 2 diabetes mellitus with peripheral neuropathy ([place]) 04/28/2012  . HTN (hypertension) 04/28/2012  . Hyperlipidemia LDL goal <70 04/28/2012     Past Surgical History:  Procedure Laterality Date  . MOHS SURGERY  2002   chin x 1 and right forehead X 1 in 2004  . TONSILLECTOMY AND ADENOIDECTOMY      OB History    Gravida Para Term Preterm AB Living   0             SAB TAB Ectopic Multiple Live Births                   Home Medications    Prior to Admission medications   Medication Sig Start Date End Date Taking? Authorizing Provider  azithromycin (ZITHROMAX) 250 MG tablet 1 po QD x 4 days 01/11/16   Chauncey Cruel, MD  Blood Glucose Monitoring Suppl (BAYER CONTOUR MONITOR) w/Device KIT Test blood sugar once daily. Dx: E11.40 12/19/15   Harrison Mons, PA-C  canagliflozin (INVOKANA) 300 MG TABS tablet Take 1 tablet (300 mg total) by mouth daily before breakfast. 08/15/15   Harrison Mons, PA-C  cholecalciferol (VITAMIN D) 1000 UNITS tablet Take 1,000 Units by mouth daily.    Historical Provider, MD  glucosamine-chondroitin 500-400 MG tablet Take 1 tablet by mouth 3 (three) times daily.    Historical Provider, MD  glucose blood (BAYER CONTOUR NEXT TEST) test strip Test blood sugar once daily. Dx: E11.40 12/19/15   Harrison Mons, PA-C  lidocaine-prilocaine (EMLA) cream Apply to port 1-2 hours  before procedure 11/23/15   Chauncey Cruel, MD  metFORMIN (GLUCOPHAGE) 1000 MG tablet Take 1 tablet (1,000 mg total)  By mouth 2 (two) times daily with a meal 08/15/15   Chelle Jeffery, PA-C  nitroGLYCERIN (NITROSTAT) 0.4 MG SL tablet Place 1 tablet (0.4 mg total) under the tongue every 5 (five) minutes as needed for chest pain. 02/16/13   Harrison Mons, PA-C    Family History Family History  Problem Relation Age of Onset  . Adopted: Yes  . Liver cancer Mother   . Lung cancer Mother   . Brain cancer Father     Social History Social History  Substance Use Topics  . Smoking status: Former Smoker    Packs/day: 1.00    Years: 5.00    Types: Cigarettes  . Smokeless tobacco: Never Used  . Alcohol use No     Allergies   Bee  venom; Statins; and Victoza [liraglutide]   Review of Systems Review of Systems  Constitutional: Negative for fever.  Respiratory: Positive for cough ( started abx recently ) and sputum production.   Cardiovascular: Positive for chest pain.  All other systems reviewed and are negative.    Physical Exam Updated Vital Signs BP 114/96   Pulse 120   Temp 97.8 F (36.6 C) (Oral)   Resp (!) 35   Wt 77.6 kg   LMP 05/13/1996   SpO2 100%   BMI 29.82 kg/m   Physical Exam  Constitutional: She appears well-developed and well-nourished. No distress.  HENT:  Head: Normocephalic and atraumatic.  Right Ear: External ear normal.  Left Ear: External ear normal.  Alopecia  Eyes: Conjunctivae are normal. Right eye exhibits no discharge. Left eye exhibits no discharge. No scleral icterus.  Neck: Neck supple. No tracheal deviation present.  Cardiovascular: Regular rhythm and intact distal pulses.  Tachycardia present.   Port-A-Cath in place no erythema around the chest wall  Pulmonary/Chest: Effort normal and breath sounds normal. No stridor. No respiratory distress. She has no wheezes. She has no rales.  Abdominal: Soft. Bowel sounds are normal. She exhibits no distension. There is no tenderness. There is no rebound and no guarding.  Musculoskeletal: She exhibits edema. She exhibits no tenderness.  Neurological: She is alert. She has normal strength. No cranial nerve deficit (no facial droop, extraocular movements intact, no slurred speech) or sensory deficit. She exhibits normal muscle tone. She displays no seizure activity. Coordination normal.  Skin: Skin is warm and dry. No rash noted.  Psychiatric: She has a normal mood and affect.  Nursing note and vitals reviewed.    ED Treatments / Results  Labs (all labs ordered are listed, but only abnormal results are displayed) Labs Reviewed  CBC - Abnormal; Notable for the following:       Result Value   WBC 20.3 (*)    RBC 2.80 (*)     Hemoglobin 8.5 (*)    HCT 25.4 (*)    RDW 17.2 (*)    All other components within normal limits  COMPREHENSIVE METABOLIC PANEL - Abnormal; Notable for the following:    CO2 18 (*)    Glucose, Bld 185 (*)    BUN 23 (*)    Creatinine, Ser 1.21 (*)    Calcium 8.8 (*)    GFR calc non Af Amer 47 (*)    GFR calc Af Amer 54 (*)    All other components within normal limits  I-STAT TROPOININ, ED - Abnormal; Notable for the following:  Troponin i, poc 0.88 (*)    All other components within normal limits  I-STAT CHEM 8, ED - Abnormal; Notable for the following:    Creatinine, Ser 1.10 (*)    Glucose, Bld 178 (*)    Calcium, Ion 1.09 (*)    Hemoglobin 8.8 (*)    HCT 26.0 (*)    All other components within normal limits  LIPASE, BLOOD  APTT  PROTIME-INR  HEPARIN LEVEL (UNFRACTIONATED)    EKG  EKG Interpretation  Date/Time:  Friday January 19 2016 09:33:31 EDT Ventricular Rate:  120 PR Interval:    QRS Duration: 82 QT Interval:  283 QTC Calculation: 400 R Axis:   79 Text Interpretation:  Sinus tachycardia Probable left atrial enlargement Low voltage, extremity and precordial leads Nonspecific repol abnormality, lateral leads Confirmed by YAO  MD, DAVID ([id number]), editor Louise, Joelene Millin [id number]) on 01/28/2016 11:00:29 AM       Radiology Dg Chest 2 View  Result Date: 02/10/2016 CLINICAL DATA:  Shortness of breath and cough for 5 days for history of breast carcinoma. EXAM: CHEST  2 VIEW COMPARISON:  Chest CT November 17, 2015 FINDINGS: There is interstitial edema, primarily in the mid and lower lung zones bilaterally. There is a minimal pleural effusion on each side. There is mild cardiomegaly with pulmonary venous hypertension. Port-A-Cath tip is in the superior vena cava. No adenopathy. No bone lesions evident. There is calcification in the right carotid artery. IMPRESSION: Findings most consistent with a degree of congestive heart failure. It should be noted that lymphangitic spread of  tumor could present in this manner and must be considered a differential consideration given history of breast carcinoma. Both entities could exist concurrently. There is no frank airspace consolidation. Port-A-Cath tip in superior vena cava. Right carotid artery calcification noted. Electronically Signed   By: Lowella Grip III M.D.   On: 01/30/2016 10:08   Ct Angio Chest Pe W And/or Wo Contrast  Result Date: 01/13/2016 CLINICAL DATA:  Chest pain, shortness of breath. History of breast cancer. EXAM: CT ANGIOGRAPHY CHEST WITH CONTRAST TECHNIQUE: Multidetector CT imaging of the chest was performed using the standard protocol during bolus administration of intravenous contrast. Multiplanar CT image reconstructions and MIPs were obtained to evaluate the vascular anatomy. CONTRAST:  52 mL of Isovue 370 intravenously. COMPARISON:  CT scan of November 17, 2015. FINDINGS: No pneumothorax is noted. Mild bilateral pleural effusions are noted, with right greater than left. Bibasilar interstitial densities are noted most consistent with pulmonary edema. There is no definite evidence of pulmonary embolus. Atherosclerosis of thoracic aorta is noted without aneurysm or dissection. Coronary artery calcifications are noted. 8 mm right peritracheal lymph node is noted which is significantly increased compared to prior exam. Also noted is 18 mm right sub carinal lymph node concerning for possible metastatic disease. 15 mm right axillary lymph node is noted which is decreased in size compared to prior exam but concerning for metastatic disease. There is been interval placement of right internal jugular Port-A-Cath with distal tip in expected position of cavoatrial junction. No definite abnormality is seen within the visualized portion of the upper abdomen. Multilevel degenerative disc disease is noted in the lower thoracic spine. Review of the MIP images confirms the above findings. IMPRESSION: No definite evidence of pulmonary  embolus. Aortic atherosclerosis. Mild bilateral pleural effusions are noted, with right greater than left. Bibasilar interstitial densities are noted most consistent with pulmonary edema. Coronary artery calcifications are noted suggesting coronary disease. Enlarged right peritracheal  and right sub carinal lymph nodes are noted concerning for possible metastatic disease. Right axillary adenopathy noted on prior exam is significantly smaller in size currently, consistent with improving metastatic disease. Electronically Signed   By: Marijo Conception, M.D.   On: 02/03/2016 12:39    Procedures Procedures (including critical care time)  Medications Ordered in ED Medications  0.9 %  sodium chloride infusion ( Intravenous New Bag/Given 02/10/2016 1207)  heparin ADULT infusion 100 units/mL (25000 units/259m sodium chloride 0.45%) (850 Units/hr Intravenous New Bag/Given 01/27/2016 1332)  aspirin chewable tablet 324 mg (324 mg Oral Given 01/18/2016 1052)  iopamidol (ISOVUE-370) 76 % injection 100 mL (52 mLs Intravenous Contrast Given 01/12/2016 1215)  heparin bolus via infusion 4,000 Units (4,000 Units Intravenous Bolus from Bag 01/14/2016 1333)     Initial Impression / Assessment and Plan / ED Course  I have reviewed the triage vital signs and the nursing notes.  EKG Sinus tachycardia rate 120 Probable left atrial enlargement Nonspecific ST changes in the lateral leads No significant change compared to the EKG performed earlier this morning at the cancer center at 0 854 Pertinent labs & imaging results that were available during my care of the patient were reviewed by me and considered in my medical decision making (see chart for details).  Clinical Course  Comment By Time  Hgb stable.  Troponin elevated.  Will ct chest as sx concerning for PE JDorie Rank MD 09/08 1122  Heparin ordered JDorie Rank MD 09/08 1259  Discussed with Dr GEinar Gipwho she has seen in the past.  That was over 2 years ago.  Pt requests an  alternative cardiologist.  I will consult with unassigned JDorie Rank MD 09/08 1301   The patient's CT scan does not show a pulmonary embolism. She does have mild bilateral pleural effusions. I dont think this would account for her pain. The patient does have enlarged lymph nodes that are associated with her cancer but I do not see why this would cause any acute symptoms for her. Patient does have an elevated troponin. Patient does have an abnormal EKG but no definite ischemic changes. I think the patient will require admission to hospital for serial cardiac enzymes and cardiology consultation. Sx may be related to ACS.  I will consult with the medical service and cardiology.   Final Clinical Impressions(s) / ED Diagnoses   Final diagnoses:  Chest pain, unspecified chest pain type  Sinus tachycardia (HCC)  Pleural effusion  Elevated troponin      JDorie Rank MD 02/06/2016 1336

## 2016-01-19 NOTE — Progress Notes (Signed)
[facility] for IV heparin Indication: chest pain/ACS  Allergies  Allergen Reactions  . Bee Venom   . Statins Other (See Comments)    MYALGIAS with Crestor, Zocor. Tolerating Lipitor (05/2013)  . Victoza [Liraglutide]     Dizziness, peripheral neuropathy, constipation, heart palpitations    Patient Measurements: Weight: 171 lb (77.6 kg) Heparin Dosing Weight: 70 kg  Vital Signs:  Temp: 97.8 F (36.6 C) (09/08 0933) Temp Source: Oral (09/08 0933) BP: 114/96 (09/08 1308) Pulse Rate: 120 (09/08 1308)  Labs:  Recent Labs  01/18/2016 0819 02/02/2016 1104 01/29/2016 1114  HGB 8.0* 8.5* 8.8*  HCT 24.7* 25.4* 26.0*  PLT 275 295  --   CREATININE 1.2* 1.21* 1.10*    Estimated Creatinine Clearance: 52.2 mL/min (by C-G formula based on SCr of 1.1 mg/dL).   Medical History: Past Medical History:  Diagnosis Date  . Arthritis   . Breast cancer metastasized to axillary lymph node ([place]) 11/02/2015  . Diabetes mellitus without complication ([place]) 123456   oral meds until Victoza in 07/2012, now change to Invokana  . Hypertension 2006  . Menopausal state   . Skin cancer     Medications: no PTA antithrombotic medications  Assessment: 89 yoF with DM, HTN, breast cancer s/p 4 cycles dose dense AC, to begin weekly paclitaxel today, sent to ED from [facility] for chest/epigastric pain and concern for possible ACS/UA d/t chemo-induced anemia. Initial troponins elevated; CT negative for PE. Admitting for serial enzymes and cardiac workup. Pharmacy consulted for heparin dosing.   Baseline INR, aPTT: pending; both normal on 11/22/15 prior to [facility] placement  Prior anticoagulation: none  Significant events:  Today, 01/24/2016:  CBC: Anemia d/t chemotherapy (last given 8/25); Plt wnl  No bleeding or infusion issues per nursing  CrCl: 52; SCr wnl  Initial TnI elevated at 0.88; cycling  Goal of Therapy: Heparin level 0.3-0.7 units/ml Monitor platelets by  anticoagulation protocol: Yes  Plan:  Heparin 400 units IV bolus x 1  Heparin 850 units/hr IV infusion  Check heparin level 6 hrs after start  Daily CBC, daily heparin level once stable  Monitor for signs of bleeding or thrombosis   Reuel Boom, PharmD Pager: [phone] 01/13/2016, 1:23 PM

## 2016-01-19 NOTE — Plan of Care (Signed)
Problem: Education: Goal: Ability to demonstrate managment of disease process will improve Outcome: Progressing Pt given "living better with heart failure" booklet. Reviewed daily weights, sodium restriction, and fluid restriction. Pt states they have a scale at home and weigh themselves daily. Pt placed on a 2g sodium diet with 1800 fluid restriction. Echo, TSH awaiting to be done.

## 2016-01-19 NOTE — ED Notes (Signed)
Patient transported to CT 

## 2016-01-19 NOTE — Progress Notes (Addendum)
[facility] for IV heparin Indication: chest pain/ACS  Allergies  Allergen Reactions  . Bee Venom   . Statins Other (See Comments)    MYALGIAS with Crestor, Zocor. Tolerating Lipitor (05/2013)  . Victoza [Liraglutide]     Dizziness, peripheral neuropathy, constipation, heart palpitations    Patient Measurements: Height: 5\' 4"  (162.6 cm) Weight: 171 lb 11.2 oz (77.9 kg) IBW/kg (Calculated) : 54.7 Heparin Dosing Weight: 70 kg  Vital Signs:  Temp: 98.6 F (37 C) (09/08 2100) Temp Source: Oral (09/08 1637) BP: 129/60 (09/08 2100) Pulse Rate: 112 (09/08 2100)  Labs:  Recent Labs  01/16/2016 0819 01/13/2016 1104 02/04/2016 1114 01/16/2016 1420 01/30/2016 2056  HGB 8.0* 8.5* 8.8*  --   --   HCT 24.7* 25.4* 26.0*  --   --   PLT 275 295  --   --   --   APTT  --  26  --   --   --   LABPROT  --  14.2  --   --   --   INR  --  1.09  --   --   --   HEPARINUNFRC  --   --   --   --  0.13*  CREATININE 1.2* 1.21* 1.10*  --   --   TROPONINI  --   --   --  1.22*  --     Estimated Creatinine Clearance: 52.9 mL/min (by C-G formula based on SCr of 1.1 mg/dL).   Medical History: Past Medical History:  Diagnosis Date  . Anemia, unspecified   . Arthritis   . Breast cancer metastasized to axillary lymph node ([place]) 11/02/2015  . CKD (chronic kidney disease), stage III   . Diabetes mellitus ([place])    oral meds until Victoza in 07/2012, now change to Invokana  . Hypertension 2006  . Menopausal state   . Skin cancer     Medications: no PTA antithrombotic medications  Assessment: 46 yoF with DM, HTN, breast cancer s/p 4 cycles dose dense AC, to begin weekly paclitaxel today, sent to ED from [facility] for chest/epigastric pain and concern for possible ACS/UA d/t chemo-induced anemia. Initial troponins elevated; CT negative for PE. Admitting for serial enzymes and cardiac workup. Pharmacy consulted for heparin dosing.  Initial heparin level 0.13. Noted plans for  possible cath on Monday.   Goal of Therapy: Heparin level 0.3-0.7 units/ml Monitor platelets by anticoagulation protocol: Yes  Plan: 1. Increase heparin infusion to 1050 units/hr 2. Heparin level in am  3. Daily CBC, daily heparin level once stable  Vincenza Hews, PharmD, BCPS 02/08/2016, 9:59 PM Pager: [phone]

## 2016-01-19 NOTE — Progress Notes (Signed)
[place]  Telephone:(336) [phone] Fax:(336) [phone]     ID: [name] DOB: July 18, 1950  MR#: [id number]  JOA#:[id number]  Patient Care Team: Harrison Mons, PA-C as PCP - General (Physician Assistant) Jari Pigg, MD as Attending Physician (Dermatology) Kem Boroughs, FNP as Nurse Practitioner (Obstetrics and Gynecology) Adrian Prows, MD as Consulting Physician (Cardiology) Erroll Luna, MD as Consulting Physician (General Surgery) Chauncey Cruel, MD as Consulting Physician (Oncology) Eppie Gibson, MD as Attending Physician (Radiation Oncology) PCP: Harrison Mons, PA-C Chauncey Cruel, MD OTHER MD:  CHIEF COMPLAINT: Estrogen receptor positive breast cancer  CURRENT TREATMENT: Neoadjuvant chemotherapy   BREAST CANCER HISTORY: From the original intake note:  "Gina Navarro" noted a mass in her right axilla 10/20/2015 and brought it to medical attention. She was scheduled for bilateral diagnostic mammography with tomography at [facility] 10/24/2015. The breast composition was category B. Mammogram of the breast showed no suspicious masses. However there was palpable right axillary adenopathy, more specifically a 5 cm "lump beneath the anterior right axilla, and ultrasound was obtained.. On ultrasound, the breast itself was unremarkable. However there were multiple enlarged right axillary lymph nodes the largest measuring 5.7 cm.   Biopsy of one of the enlarged right axillary lymph nodes on 10/25/2015 showed metastatic carcinoma, which was cytokeratin 7 positive, estrogen receptor 95% positive, progesterone receptor 10% positive, both with strong staining intensity, and HER-2 nonamplified, with a signals ratio 1.62, and the number percell 2.10.  MRI of the breasts 11/06/2015 again showed no mass or abnormal enhancement in the right breast or left breast. In the right axilla there were 6 enlarged grossly abnormal lymph nodes.  The patient's subsequent history is as detailed  below  INTERVAL HISTORY: Arrie Navarro returns today for treatment of her breast cancer. Today is day 1 cycle 1 of 12 planned cycles of weekly Abraxane 12.-- However she tells me for the past several days she has been having chest pressure and shortness of breath. She also has epigastric discomfort. Last night this walker up and she had to sit up in bed. That helped a little bit. She relates this to starting the Zithromax, which she received for an upper respiratory infection. She says she has had similar problems with antibiotics and the past  REVIEW OF SYSTEMS: Gina Navarro also tells me that her left ankle is more swollen than usual. This is chronically slightly swollen because of neuropathy issues. She feels fatigued, is having palpitations, but normal bowel movements, no cough, phlegm production, or pleurisy, no unusual headaches, no nausea or vomiting. A detailed review of systems today was otherwise stable.  PAST MEDICAL HISTORY: Past Medical History:  Diagnosis Date  . Arthritis   . Breast cancer metastasized to axillary lymph node ([place]) 11/02/2015  . Diabetes mellitus without complication ([place]) 6010   oral meds until Victoza in 07/2012, now change to Invokana  . Hypertension 2006  . Menopausal state   . Skin cancer    Gina Navarro  PAST SURGICAL HISTORY: Past Surgical History:  Procedure Laterality Date  . MOHS SURGERY  2002   chin x 1 and right forehead X 1 in 2004  . TONSILLECTOMY AND ADENOIDECTOMY      FAMILY HISTORY Family History  Problem Relation Age of Onset  . Adopted: Yes  . Liver cancer Mother   . Lung cancer Mother   . Brain cancer Father   The patient is adopted and knows little about her biological family. Both are adopted parents have died. Her adopted Brother (not biologically  related) Lowry Ram, lives in [place].  GYNECOLOGIC HISTORY:  Patient's last menstrual period was 05/13/1996. Menarche age 14, the patient is GX P0. She went through menopause in her early 31s  and did not take hormone replacement. She never used oral contraceptives.  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 She is contracted by group who contracts to the state. She is single and lives alone with 4 dogs. She has a large fasting yard. She attends the [employer]    ADVANCED DIRECTIVES: The patient completed a healthcare part of attorney document and a copy was filed to be scanned 12/01/2015. She has named [employer] representative as her healthcare power of attorney. She can be reached at [place]: Social History  Substance Use Topics  . Smoking status: Former Smoker    Packs/day: 1.00    Years: 5.00    Types: Cigarettes  . Smokeless tobacco: Never Used  . Alcohol use No     Colonoscopy: Never  PAP:  Bone density: Never   Allergies  Allergen Reactions  . Bee Venom   . Statins Other (See Comments)    MYALGIAS with Crestor, Zocor. Tolerating Lipitor (05/2013)  . Victoza [Liraglutide]     Dizziness, peripheral neuropathy, constipation, heart palpitations    Current Outpatient Prescriptions  Medication Sig Dispense Refill  . azithromycin (ZITHROMAX) 250 MG tablet 1 po QD x 4 days 4 each 0  . Blood Glucose Monitoring Suppl (BAYER CONTOUR MONITOR) w/Device KIT Test blood sugar once daily. Dx: E11.40 1 kit 0  . canagliflozin (INVOKANA) 300 MG TABS tablet Take 1 tablet (300 mg total) by mouth daily before breakfast. 90 tablet 3  . cholecalciferol (VITAMIN D) 1000 UNITS tablet Take 1,000 Units by mouth daily.    Marland Kitchen glucosamine-chondroitin 500-400 MG tablet Take 1 tablet by mouth 3 (three) times daily.    Marland Kitchen glucose blood (BAYER CONTOUR NEXT TEST) test strip Test blood sugar once daily. Dx: E11.40 100 each 2  . lidocaine-prilocaine (EMLA) cream Apply to port 1-2 hours before procedure 30 g 1  . metFORMIN  (GLUCOPHAGE) 1000 MG tablet Take 1 tablet (1,000 mg total)  By mouth 2 (two) times daily with a meal 180 tablet 3  . nitroGLYCERIN (NITROSTAT) 0.4 MG SL tablet Place 1 tablet (0.4 mg total) under the tongue every 5 (five) minutes as needed for chest pain. 25 tablet 1   No current facility-administered medications for this visit.     OBJECTIVE: Middle-aged white woman  Vitals:   01/21/2016 0839  BP: (!) 114/53  Pulse: (!) 130  Resp: 18  Temp: 98.3 F (36.8 C)     Body mass index is 29.89 kg/m.    ECOG FS:1 - Symptomatic but completely ambulatory  Sclerae unicteric, EOMs intact Oropharynx clear and moist No cervical or supraclavicular adenopathy Lungs no rales or rhonchi Heart regular but rapid rate  Abd soft, nontender, positive bowel sounds MSK no focal spinal tenderness, minimal left ankle lymphedema Neuro: nonfocal, well oriented, appropriate affect Breasts: Deferred      LAB RESULTS:  CMP     Component Value Date/Time   NA 138 01/15/2016 0819   K 4.0 01/18/2016 0819   CL 109 11/22/2015 0845   CO2 16 (L) 02/05/2016 0819   GLUCOSE 239 (H) 01/15/2016 0819   BUN 20.8 01/13/2016 0819  CREATININE 1.2 (H) 02/02/2016 0819   CALCIUM 8.9 01/28/2016 0819   PROT 6.6 02/09/2016 0819   ALBUMIN 3.2 (L) 01/27/2016 0819   AST 16 02/09/2016 0819   ALT 24 01/29/2016 0819   ALKPHOS 83 01/12/2016 0819   BILITOT 0.84 01/24/2016 0819   GFRNONAA 46 (L) 11/22/2015 0845   GFRNONAA 67 08/31/2013 1007   GFRAA 53 (L) 11/22/2015 0845   GFRAA 77 08/31/2013 1007    INo results found for: SPEP, UPEP  Lab Results  Component Value Date   WBC 15.7 (H) 02/02/2016   NEUTROABS 14.7 (H) 01/14/2016   HGB 8.0 (L) 01/18/2016   HCT 24.7 (L) 01/18/2016   MCV 91.3 02/04/2016   PLT 275 01/24/2016      Chemistry      Component Value Date/Time   NA 138 01/31/2016 0819   K 4.0 01/30/2016 0819   CL 109 11/22/2015 0845   CO2 16 (L) 02/07/2016 0819   BUN 20.8 02/09/2016 0819   CREATININE 1.2  (H) 02/10/2016 0819      Component Value Date/Time   CALCIUM 8.9 01/18/2016 0819   ALKPHOS 83 02/03/2016 0819   AST 16 01/31/2016 0819   ALT 24 02/05/2016 0819   BILITOT 0.84 01/16/2016 0819       No results found for: LABCA2  No components found for: BSWHQ759  No results for input(s): INR in the last 168 hours.  Urinalysis    Component Value Date/Time   BILIRUBINUR n 12/13/2014 1000   PROTEINUR n 12/13/2014 1000   UROBILINOGEN negative 12/13/2014 1000   NITRITE n 12/13/2014 1000   LEUKOCYTESUR Negative 11/05/2011 1201     STUDIES: No results found.  ELIGIBLE FOR AVAILABLE RESEARCH PROTOCOL: no  ASSESSMENT: 65 y.o. [place] woman status post right breast axillary tail lymph node biopsy 10/25/2015 for a clinical  TX N2, stage IIIA invasive ductal carcinoma, estrogen and progesterone receptor positive, HER-2 nonamplified   (1) neoadjuvant chemotherapy started 11/23/2015 consisting of cyclophosphamide and doxorubicin in dose dense fashion 4 completed 01/05/2016,  to be followed by weekly paclitaxel 12  (2) axillary lymph node dissection to follow chemotherapy  (3) adjuvant radiation to follow surgery  (4) anti-estrogens to follow at the completion of local treatment  (5) genetics testing pending  (6) presenting with either significant reflux symptoms or unstable angina, or both, in the setting of chemotherapy-induced anemia    PLAN: I obtained an electrocardiogram on Gina Navarro hoping it would be flat normal but it certainly isn't. She has a high heart rate which is only partially explained by her anemia: Her hemoglobin was even lower last week when her heart rate was only 96. While she does have some symptoms consistent with GERD, I am concerned she may be experiencing unstable angina or a pulmonary embolus.  Accordingly she is being taking to the emergency room for further evaluation, and I am postponing her chemotherapy. Hopefully we will be able to start Abraxane  late next week or the following.    Chauncey Cruel, MD   02/01/2016 9:16 AM Medical Oncology and Hematology [facility] [address] [place], [place] [address] Tel. [phone]    Fax. [phone]

## 2016-01-19 NOTE — Consult Note (Signed)
Cardiology Consultation Note    Patient ID: Gina Navarro, MRN: [id number], DOB/AGE: 1951-04-29 65 y.o. Admit date: 01/18/2016   Date of Consult: 01/27/2016 Primary Physician: Harrison Mons, PA-C Primary Cardiologist: New to consulting doctor   Chief Complaint: chest pain Reason for Consultation: chest pain Requesting MD: Dr. Tomi Bamberger  HPI: Gina Navarro is a 65 y.o. female with history of DM, CKD stage III (pt unaware - baseline Cr 1.2-1.4), HTN (lisinopril stopped summer 2017 due to faintness), arthritis, and recently diagnosed breast CA who presented to [facility] upon transfer from [facility] for concern for NSTEMI and acute CHF. She self-discovered axillary lymph node enlargement in 10/2015 and while mammogram was unremarkable at that time, her axillary lymphadenopathy showed metastatic breast carcinoma. She underwent chemotherapy with cyclophosphamide and doxorubin 11/23/15-01/05/16, to be followed by weekly paclitaxel, with further plans for lymph node dissection, adjuvant radiation, and anti-estrogen therapy. She underwent a screening echocardiogram 11/17/15 which showed mild LVH, EF 60-65%, grade 1 DD, trivial pericardial effusion, normal GLS. She saw Dr. Einar Gip in 2014 for episodes of chest pain which resolved after discontinuation of Victoza - echo at that time showed normal EF, and she reports a stress test that was OK. She was actually pending appointment 02/2016 with Dr. Clayborne Dana cardio-onc clinic for monitoring for cardiomyopathy.  1 week ago she began to notice chest pressure with any sort of exertion - walking, going up steps, bending over, relieved with rest. It was described as a tightness. Yesterday she began to notice some coughing and felt very SOB with minimal activity. She was unable to lie flat due to orthopnea. Had to sit up all night long. Saw Dr. Jana Hakim this morning - upon relaying her symptoms she was referred to the ER where she was noted to be tachycardic in the 110-120 (sinus tach), normal  pulse ox, troponin 0.88->1.22, Hgb 8.5 (recent anemia in setting of chemo without evidence for bleeding per patient), WBC 20k, Cr stable at 1.2. LFTs. Wnl. CT angio showed no pulm embolism but did show coronary artery calcifications suggesting coronary disease, bibasilar interstitial densities most c/w pulm edema, enlarged right peritracheal and right sub carinal lymph nodes are noted concerning for possible metastatic disease, right axillary adenopathy noted on prior exam is significantly smaller in size. She was given 326m ASA and was started on heparin per pharmacy. She is asymptomatic so long as she is sitting up resting in bed.   Past Medical History:  Diagnosis Date  . Anemia, unspecified   . Arthritis   . Breast cancer metastasized to [place]illary lymph node ([place] 11/02/2015  . CKD (chronic kidney disease), stage III   . Diabetes mellitus ([place]    oral meds until Victoza in 07/2012, now change to Invokana  . Hypertension 2006  . Menopausal state   . Skin cancer       Surgical History:  Past Surgical History:  Procedure Laterality Date  . MOHS SURGERY  2002   chin x 1 and right forehead X 1 in 2004  . TONSILLECTOMY AND ADENOIDECTOMY       Home Meds: Prior to Admission medications   Medication Sig Start Date End Date Taking? Authorizing Provider  canagliflozin (INVOKANA) 300 MG TABS tablet Take 1 tablet (300 mg total) by mouth daily before breakfast. 08/15/15  Yes Chelle Jeffery, PA-C  cholecalciferol (VITAMIN D) 1000 UNITS tablet Take 1,000 Units by mouth daily.   Yes Historical Provider, MD  glucosamine-chondroitin 500-400 MG tablet Take 1 tablet by mouth 3 (  three) times daily.   Yes Historical Provider, MD  guaiFENesin (MUCINEX) 600 MG 12 hr tablet Take 600 mg by mouth 2 (two) times daily.   Yes Historical Provider, MD  lidocaine-prilocaine (EMLA) cream Apply to port 1-2 hours before procedure 11/23/15  Yes Chauncey Cruel, MD  loratadine (CLARITIN) 10 MG tablet Take 10 mg by mouth  daily.   Yes Historical Provider, MD  metFORMIN (GLUCOPHAGE) 1000 MG tablet Take 1 tablet (1,000 mg total)  By mouth 2 (two) times daily with a meal 08/15/15  Yes Chelle Jeffery, PA-C  nitroGLYCERIN (NITROSTAT) 0.4 MG SL tablet Place 1 tablet (0.4 mg total) under the tongue every 5 (five) minutes as needed for chest pain. 02/16/13  Yes Chelle Jeffery, PA-C  Blood Glucose Monitoring Suppl (BAYER CONTOUR MONITOR) w/Device KIT Test blood sugar once daily. Dx: E11.40 12/19/15   Chelle Jeffery, PA-C  glucose blood (BAYER CONTOUR NEXT TEST) test strip Test blood sugar once daily. Dx: G86.76 12/19/15   Harrison Mons, PA-C    Inpatient Medications:  . [START ON 01/20/2016] acetaminophen  1,000 mg Oral On Call to OR  . [START ON 01/20/2016] aspirin EC  81 mg Oral Daily  . [START ON 01/20/2016]  ceFAZolin (ANCEF) IV  3 g Intravenous On Call to OR  . [START ON 01/20/2016] celecoxib  400 mg Oral On Call to OR  . Chlorhexidine Gluconate Cloth  6 each Topical Once   And  . [START ON 01/20/2016] Chlorhexidine Gluconate Cloth  6 each Topical Once  . [START ON 01/20/2016] gabapentin  300 mg Oral On Call to OR  . insulin aspart  0-9 Units Subcutaneous TID WC   . heparin 850 Units/hr (02/06/2016 1332)    Allergies:  Allergies  Allergen Reactions  . Bee Venom   . Statins Other (See Comments)    MYALGIAS with Crestor, Zocor. Tolerating Lipitor (05/2013)  . Victoza [Liraglutide]     Dizziness, peripheral neuropathy, constipation, heart palpitations    Social History   Social History  . Marital status: Single    Spouse name: n/a  . Number of children: 0  . Years of education: 76   Occupational History  . Psychologist (PhD)    Social History Main Topics  . Smoking status: Former Smoker    Packs/day: 1.00    Years: 5.00    Types: Cigarettes  . Smokeless tobacco: Never Used     Comment: Quit age 86  . Alcohol use No  . Drug use: No  . Sexual activity: Yes    Partners: Female    Birth control/ protection:  Post-menopausal   Other Topics Concern  . Not on file   Social History Narrative   Adoptive mother died in 01-Apr-2012. Lives alone.     Family History  Problem Relation Age of Onset  . Adopted: Yes  . Liver cancer Mother   . Lung cancer Mother   . Brain cancer Father      Review of Systems: No fevers,  All other systems reviewed and are otherwise negative except as noted above.  Labs:  Recent Labs  01/29/2016 1420  TROPONINI 1.22*   Lab Results  Component Value Date   WBC 20.3 (H) 01/21/2016   HGB 8.8 (L) 01/24/2016   HCT 26.0 (L) 01/21/2016   MCV 90.7 01/13/2016   PLT 295 01/29/2016    Recent Labs Lab 01/15/2016 1104 01/31/2016 1114  NA 138 137  K 4.2 4.1  CL 106 105  CO2 18*  --  BUN 23* 19  CREATININE 1.21* 1.10*  CALCIUM 8.8*  --   PROT 6.9  --   BILITOT 1.1  --   ALKPHOS 82  --   ALT 26  --   AST 20  --   GLUCOSE 185* 178*   Lab Results  Component Value Date   CHOL 230 (H) 08/15/2015   HDL 35 (L) 08/15/2015   LDLCALC NOT CALC 08/15/2015   TRIG 417 (H) 08/15/2015   No results found for: DDIMER  Radiology/Studies:  Dg Chest 2 View  Result Date: 01/16/2016 CLINICAL DATA:  Shortness of breath and cough for 5 days for history of breast carcinoma. EXAM: CHEST  2 VIEW COMPARISON:  Chest CT November 17, 2015 FINDINGS: There is interstitial edema, primarily in the mid and lower lung zones bilaterally. There is a minimal pleural effusion on each side. There is mild cardiomegaly with pulmonary venous hypertension. Port-A-Cath tip is in the superior vena cava. No adenopathy. No bone lesions evident. There is calcification in the right carotid artery. IMPRESSION: Findings most consistent with a degree of congestive heart failure. It should be noted that lymphangitic spread of tumor could present in this manner and must be considered a differential consideration given history of breast carcinoma. Both entities could exist concurrently. There is no frank airspace  consolidation. Port-A-Cath tip in superior vena cava. Right carotid artery calcification noted. Electronically Signed   By: Lowella Grip III M.D.   On: 02/09/2016 10:08   Ct Angio Chest Pe W And/or Wo Contrast  Result Date: 01/25/2016 CLINICAL DATA:  Chest pain, shortness of breath. History of breast cancer. EXAM: CT ANGIOGRAPHY CHEST WITH CONTRAST TECHNIQUE: Multidetector CT imaging of the chest was performed using the standard protocol during bolus administration of intravenous contrast. Multiplanar CT image reconstructions and MIPs were obtained to evaluate the vascular anatomy. CONTRAST:  52 mL of Isovue 370 intravenously. COMPARISON:  CT scan of November 17, 2015. FINDINGS: No pneumothorax is noted. Mild bilateral pleural effusions are noted, with right greater than left. Bibasilar interstitial densities are noted most consistent with pulmonary edema. There is no definite evidence of pulmonary embolus. Atherosclerosis of thoracic aorta is noted without aneurysm or dissection. Coronary artery calcifications are noted. 8 mm right peritracheal lymph node is noted which is significantly increased compared to prior exam. Also noted is 18 mm right sub carinal lymph node concerning for possible metastatic disease. 15 mm right axillary lymph node is noted which is decreased in size compared to prior exam but concerning for metastatic disease. There is been interval placement of right internal jugular Port-A-Cath with distal tip in expected position of cavoatrial junction. No definite abnormality is seen within the visualized portion of the upper abdomen. Multilevel degenerative disc disease is noted in the lower thoracic spine. Review of the MIP images confirms the above findings. IMPRESSION: No definite evidence of pulmonary embolus. Aortic atherosclerosis. Mild bilateral pleural effusions are noted, with right greater than left. Bibasilar interstitial densities are noted most consistent with pulmonary edema.  Coronary artery calcifications are noted suggesting coronary disease. Enlarged right peritracheal and right sub carinal lymph nodes are noted concerning for possible metastatic disease. Right axillary adenopathy noted on prior exam is significantly smaller in size currently, consistent with improving metastatic disease. Electronically Signed   By: Marijo Conception, M.D.   On: 01/31/2016 12:39    Wt Readings from Last 3 Encounters:  02/03/2016 171 lb 11.2 oz (77.9 kg)  01/29/2016 171 lb 6.4 oz (77.7 kg)  01/05/16 170 lb 6.4 oz (77.3 kg)   EKG: sinus tachycardia, nonspecific ST-T changes, mild ST depression inferiorly, V5-v6   Physical Exam: Blood pressure 116/70, pulse 113, temperature 98.7 F (37.1 C), temperature source Oral, resp. rate 23, height 5' 4"  (1.626 m), weight 171 lb 11.2 oz (77.9 kg), last menstrual period 05/13/1996, SpO2 100 %. Body mass index is 29.47 kg/m. General: Well developed, well nourished WF, in no acute distress.  Head: Normocephalic, atraumatic, sclera non-icteric, no xanthomas, nares are without discharge. Alopecia. Neck: Negative for carotid bruits. JVD not elevated. Lungs:Coarse BS at bases bilaterally. Breathing is unlabored. Heart: Reg rhythm, elevated rate, with S1 S2. No murmurs, rubs, or gallops appreciated. Abdomen: Soft, non-tender, non-distended with normoactive bowel sounds. No hepatomegaly. No rebound/guarding. No obvious abdominal masses. Msk:  Strength and tone appear normal for age. Extremities: No clubbing or cyanosis. No edema.  Distal pedal pulses are 2+ and equal bilaterally. Neuro: Alert and oriented X 3. No facial asymmetry. No focal deficit. Moves all extremities spontaneously. Psych:  Responds to questions appropriately with a normal affect.     Assessment and Plan  81F with DM, CKD stage III (pt unaware - baseline Cr 1.2-1.4), HTN (lisinopril stopped summer 2017 due to faintness), arthritis, and recently diagnosed metastatic breast CA to  axillary lymph nodes ( tx with cyclophosphamide/doxorubicin this summer), anemia felt related to chemo who presented to [facility] upon transfer from [facility] for concern for NSTEMI and acute CHF. Screening echo prior to chemo 11/2015 with normal EF. Troponin up to 1.22 on admission. CTA without PE but + suspicion of CHF, coronary calcifications, and enlarged right peritracheal and right sub carinal lymph nodes are noted concerning for possible metastatic disease.  1. NSTEMI with suspected acute CHF - etiology could be ischemic in nature versus chemotherapy induced. Agree with aspirin, heparin. Hold off BB given acute decompensation. BP too soft to add ACEI just yet. Lipids in AM. Chest pain free at present time. Check 2D echo, TSH. Will review further eval with MD.  2. CKD III - will need to follow given contrast with CT.  3. Breast CA - may need oncology input regarding peritracheal/subcarinal findings on CT - will defer to primary team to review.  4. HTN - taken off ACEI due to faintness/BP dropping. Follow.  Signed, Charlie Pitter PA-C 01/25/2016, 5:06 PM Pager: [phone]  Patient seen and examined  Agree with findings as noted by D Dunn above Pt with history of breast CA recently diagnosed  Undergoing chemo therapy with cyclophosphamid and doxorubicin and paclitaxel.   Note she said she had a cold about 1 month ago  Has had cough  Felt like never fully got over. Today came to ER with 1 wk history of increased SOB and chest pressure  Sent to ER form G Magrinats office  Troponin elevate at 1.22  On exam, pt is comfortable at a 60 degree elevation in bed  Neck  JVP is increased  Lungs Decreased  BS at bases  Cardiac exam RRR  Tachy S1, S2  Soft S3  Abd is benign  Ext with tr edema  CT angio scan today showed no PE   Pleural effusions Coronary calcifications  ON my review pericardial effusion (not large)  Plan  Heparin IV lasix  Follow response to 80 mg tonight  Check Cr in AM and dose accordingly Echo in  am to define LV/RV function and effusion Will prob need L heart cath on Monday to define anatomy  Follow heart rate  I would not add other meds today  Follow BP

## 2016-01-20 ENCOUNTER — Inpatient Hospital Stay (HOSPITAL_COMMUNITY): Payer: BLUE CROSS/BLUE SHIELD

## 2016-01-20 DIAGNOSIS — R079 Chest pain, unspecified: Secondary | ICD-10-CM

## 2016-01-20 DIAGNOSIS — I5021 Acute systolic (congestive) heart failure: Secondary | ICD-10-CM

## 2016-01-20 DIAGNOSIS — C773 Secondary and unspecified malignant neoplasm of axilla and upper limb lymph nodes: Secondary | ICD-10-CM

## 2016-01-20 DIAGNOSIS — C50919 Malignant neoplasm of unspecified site of unspecified female breast: Secondary | ICD-10-CM

## 2016-01-20 DIAGNOSIS — I214 Non-ST elevation (NSTEMI) myocardial infarction: Principal | ICD-10-CM

## 2016-01-20 LAB — HEPARIN LEVEL (UNFRACTIONATED)
HEPARIN UNFRACTIONATED: 0.17 [IU]/mL — AB (ref 0.30–0.70)
Heparin Unfractionated: 0.26 IU/mL — ABNORMAL LOW (ref 0.30–0.70)

## 2016-01-20 LAB — URINE MICROSCOPIC-ADD ON
RBC / HPF: NONE SEEN RBC/hpf (ref 0–5)
Squamous Epithelial / LPF: NONE SEEN

## 2016-01-20 LAB — HEMOGLOBIN A1C
Hgb A1c MFr Bld: 8 % — ABNORMAL HIGH (ref 4.8–5.6)
Mean Plasma Glucose: 183 mg/dL

## 2016-01-20 LAB — BASIC METABOLIC PANEL
Anion gap: 14 (ref 5–15)
BUN: 25 mg/dL — ABNORMAL HIGH (ref 6–20)
CALCIUM: 8.5 mg/dL — AB (ref 8.9–10.3)
CHLORIDE: 106 mmol/L (ref 101–111)
CO2: 20 mmol/L — AB (ref 22–32)
CREATININE: 1.22 mg/dL — AB (ref 0.44–1.00)
GFR calc Af Amer: 53 mL/min — ABNORMAL LOW (ref >60)
GFR calc non Af Amer: 46 mL/min — ABNORMAL LOW (ref >60)
GLUCOSE: 134 mg/dL — AB (ref 65–99)
Potassium: 4 mmol/L (ref 3.5–5.1)
Sodium: 140 mmol/L (ref 135–145)

## 2016-01-20 LAB — LIPID PANEL
CHOL/HDL RATIO: 6.5 ratio
Cholesterol: 175 mg/dL (ref 0–200)
HDL: 27 mg/dL — ABNORMAL LOW (ref >40)
LDL CALC: 112 mg/dL — AB (ref 0–99)
Triglycerides: 178 mg/dL — ABNORMAL HIGH (ref <150)
VLDL: 36 mg/dL (ref 0–40)

## 2016-01-20 LAB — ECHOCARDIOGRAM COMPLETE
E/e' ratio: 12.39
FS: 22 % — AB (ref 28–44)
HEIGHTINCHES: 64 in
IVS/LV PW RATIO, ED: 0.95
LA diam index: 2.38 cm/m2
LA vol A4C: 48.7 ml
LASIZE: 43 mm
LAVOL: 54.8 mL
LAVOLIN: 30.3 mL/m2
LEFT ATRIUM END SYS DIAM: 43 mm
LV PW d: 9.16 mm — AB (ref 0.6–1.1)
LV TDI E'MEDIAL: 6.2
LVEEAVG: 12.39
LVEEMED: 12.39
LVELAT: 7.83 cm/s
LVOT area: 2.54 cm2
LVOT diameter: 18 mm
Lateral S' vel: 14.3 cm/s
MV Peak grad: 4 mmHg
MVPKEVEL: 97 m/s
TAPSE: 21.2 mm
TDI e' lateral: 7.83
WEIGHTICAEL: 2670.4 [oz_av]

## 2016-01-20 LAB — CBC
HEMATOCRIT: 23.4 % — AB (ref 36.0–46.0)
Hemoglobin: 7.4 g/dL — ABNORMAL LOW (ref 12.0–15.0)
MCH: 29.2 pg (ref 26.0–34.0)
MCHC: 31.6 g/dL (ref 30.0–36.0)
MCV: 92.5 fL (ref 78.0–100.0)
Platelets: 287 10*3/uL (ref 150–400)
RBC: 2.53 MIL/uL — ABNORMAL LOW (ref 3.87–5.11)
RDW: 17.4 % — AB (ref 11.5–15.5)
WBC: 11.3 10*3/uL — ABNORMAL HIGH (ref 4.0–10.5)

## 2016-01-20 LAB — GLUCOSE, CAPILLARY
GLUCOSE-CAPILLARY: 134 mg/dL — AB (ref 65–99)
Glucose-Capillary: 162 mg/dL — ABNORMAL HIGH (ref 65–99)
Glucose-Capillary: 150 mg/dL — ABNORMAL HIGH (ref 65–99)
Glucose-Capillary: 203 mg/dL — ABNORMAL HIGH (ref 65–99)

## 2016-01-20 LAB — URINALYSIS, ROUTINE W REFLEX MICROSCOPIC
Bilirubin Urine: NEGATIVE
Hgb urine dipstick: NEGATIVE
KETONES UR: 40 mg/dL — AB
LEUKOCYTES UA: NEGATIVE
NITRITE: NEGATIVE
PH: 5.5 (ref 5.0–8.0)
Protein, ur: NEGATIVE mg/dL
SPECIFIC GRAVITY, URINE: 1.036 — AB (ref 1.005–1.030)

## 2016-01-20 LAB — TROPONIN I: TROPONIN I: 1.55 ng/mL — AB (ref <0.03)

## 2016-01-20 MED ORDER — FUROSEMIDE 10 MG/ML IJ SOLN
40.0000 mg | Freq: Once | INTRAMUSCULAR | Status: AC
Start: 2016-01-20 — End: 2016-01-20
  Administered 2016-01-20: 40 mg via INTRAVENOUS
  Filled 2016-01-20: qty 4

## 2016-01-20 NOTE — Progress Notes (Signed)
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)   NSTEMI (non-ST elevated myocardial infarction) ([place])   CKD (chronic kidney disease), stage III   NSTEMI , presented with chest pain, currently chest pain free - patient with typical symptoms with chest pain and dyspnea with activity and relieved by rest, for a week and with positive troponin in the ER -  heparin gtt, asa, not a candidate for statin due to allergy, avoid initiating betablocker in the acute setting per cardiology recommendation, plan to cath on 9/11   Acute CHF, unknown type - clinically she is fluid overload, with pulmonary edema on chest x ray, lower extremity edema, DOE, now cough , worse at night - 2D echo pending - concern for ischemic cardiomyopathy vs doxorubicin induced as she just completed chemo 2 weeks ago -trial of iv lasix, monitor blood pressure and cr  CAD - observed on CT scan, patient was evaluated apparently with a stress test by Dr. Einar Gip 3-4 years ago. She would prefer not to see Dr. 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 repeat A1C 8.0  on 9/8 - hold home medications, place pt on SSI, carb modified diet  HLD - ldl 112, triglyceride 178 -allergic to statins, consider zetia  H/o HTN -  normotensive in the ER, not on bp meds at home  State IIIA inavasive ductal carcinoma, ER+PR+ breast cancer ,under neoadjuvant chemotheray last chemo with doxorubicin/cytoxan on  8/25 with supportive GCSF injection  per Oncologist Dr Jana Hakim plan to finish 4cycles of Ac then, paclitazel x2, then surgery, then adjuvant XRT.   DVT prophylaxis: Heparin infusion  Code Status: Full  Family Communication: no family bedside, friend present Disposition Plan: remain in patient   Consultants:  cardiology  Procedures:  Cardiac cath planned on 9/11  Antibiotics:  none   Objective: BP 127/62 (BP Location: Left Arm)   Pulse (!) 106   Temp 97.5 F (36.4 C) (Oral)   Resp 16   Ht 5\' 4"  (1.626 m)   Wt 75.7 kg (166 lb 14.4 oz)   LMP 05/13/1996   SpO2 100%   BMI 28.65 kg/m   Intake/Output Summary (Last 24 hours) at 01/20/16 1029 Last data filed at 01/20/16 0845  Gross per 24 hour  Intake          1565.82 ml  Output             2800 ml  Net         -1234.18 ml   Filed Weights   01/28/2016 0934 02/09/2016 1637 01/20/16 0500  Weight: 77.6 kg (171 lb) 77.9 kg (171 lb 11.2 oz) 75.7 kg (166 lb 14.4 oz)    Exam:   General:  NAD, allopecia  Cardiovascular: sinsus tachycardia  Respiratory: diminished at basis  Abdomen: Soft/ND/NT, positive BS  Musculoskeletal: +Edema, more on the left, (negative DVT by venous doppler)  Neuro: aaox3  Data Reviewed: Basic Metabolic Panel:  Recent Labs Lab 01/29/2016  [id number] 02/02/2016 1104 02/10/2016 1114 01/20/16 0527  NA 138 138 137 140  K 4.0 4.2 4.1 4.0  CL  --  106 105 106  CO2 16* 18*  --  20*  GLUCOSE 239* 185* 178* 134*  BUN 20.8 23* 19 25*  CREATININE 1.2* 1.21* 1.10* 1.22*  CALCIUM 8.9 8.8*  --  8.5*   Liver Function Tests:  Recent Labs Lab 01/22/2016 0819 02/10/2016 1104  AST 16 20  ALT 24 26  ALKPHOS 83 82  BILITOT 0.84 1.1  PROT 6.6 6.9  ALBUMIN 3.2* 3.8    Recent Labs Lab 02/04/2016 1104  LIPASE 21   No results for input(s): AMMONIA in the last 168 hours. CBC:  Recent Labs Lab 01/26/2016 0819 01/28/2016 1104 01/18/2016 1114 01/20/16 0527  WBC 15.7* 20.3*  --  11.3*  NEUTROABS 14.7*  --    --   --   HGB 8.0* 8.5* 8.8* 7.4*  HCT 24.7* 25.4* 26.0* 23.4*  MCV 91.3 90.7  --  92.5  PLT 275 295  --  287   Cardiac Enzymes:    Recent Labs Lab 02/05/2016 1420 01/20/16 0527  TROPONINI 1.22* 1.55*   BNP (last 3 results) No results for input(s): BNP in the last 8760 hours.  ProBNP (last 3 results) No results for input(s): PROBNP in the last 8760 hours.  CBG:  Recent Labs Lab 02/01/2016 1559 02/05/2016 1649 01/14/2016 2132 01/20/16 0725  GLUCAP 128* 145* 236* 134*    No results found for this or any previous visit (from the past 240 hour(s)).   Studies: Ct Angio Chest Pe W And/or Wo Contrast  Result Date: 02/07/2016 CLINICAL DATA:  Chest pain, shortness of breath. History of breast cancer. EXAM: CT ANGIOGRAPHY CHEST WITH CONTRAST TECHNIQUE: Multidetector CT imaging of the chest was performed using the standard protocol during bolus administration of intravenous contrast. Multiplanar CT image reconstructions and MIPs were obtained to evaluate the vascular anatomy. CONTRAST:  52 mL of Isovue 370 intravenously. COMPARISON:  CT scan of November 17, 2015. FINDINGS: No pneumothorax is noted. Mild bilateral pleural effusions are noted, with right greater than left. Bibasilar interstitial densities are noted most consistent with pulmonary edema. There is no definite evidence of pulmonary embolus. Atherosclerosis of thoracic aorta is noted without aneurysm or dissection. Coronary artery calcifications are noted. 8 mm right peritracheal lymph node is noted which is significantly increased compared to prior exam. Also noted is 18 mm right sub carinal lymph node concerning for possible metastatic disease. 15 mm right axillary lymph node is noted which is decreased in size compared to prior exam but concerning for metastatic disease. There is been interval placement of right internal jugular Port-A-Cath with distal tip in expected position of cavoatrial junction. No definite abnormality is seen within the  visualized portion of the upper abdomen. Multilevel degenerative disc disease is noted in the lower thoracic spine. Review of the MIP images confirms the above findings. IMPRESSION: No definite evidence of pulmonary embolus. Aortic atherosclerosis. Mild bilateral pleural effusions are noted, with right greater than left. Bibasilar interstitial densities are noted most consistent with pulmonary edema. Coronary artery calcifications are noted suggesting coronary disease. Enlarged right peritracheal and right sub carinal lymph nodes are noted concerning for possible metastatic disease. Right axillary adenopathy noted on prior exam is significantly smaller in size currently, consistent with improving metastatic disease. Electronically Signed   By: Marijo Conception, M.D.   On: 02/02/2016 12:39    Scheduled Meds: . acetaminophen  1,000 mg Oral On Call to OR  . aspirin EC  81 mg Oral Daily  .  ceFAZolin (ANCEF) IV  3 g Intravenous On Call to OR  . celecoxib  400 mg Oral On Call to OR  . Chlorhexidine Gluconate Cloth  6 each Topical Once   And  . Chlorhexidine Gluconate Cloth  6 each Topical Once  . gabapentin  300 mg Oral On Call to OR  . insulin aspart  0-9 Units Subcutaneous TID WC    Continuous Infusions: . heparin 1,250 Units/hr (01/20/16 0708)     Time spent: 97mins  Author Hatlestad MD, PhD  [facility] Pager [phone]. If 7PM-7AM, please contact night-coverage at [email or web address], password [facility] 01/20/2016, 10:29 AM  LOS: 1 day

## 2016-01-20 NOTE — Plan of Care (Signed)
Problem: Cardiac: Goal: Ability to achieve and maintain adequate cardiopulmonary perfusion will improve Outcome: Not Met (add Reason) Pt lungs clear at bases, still has dyspnea with exertion. HR jumped up to 130's with ambulation in room. Unsustained. Heparin continues at 12.5.    

## 2016-01-20 NOTE — Progress Notes (Signed)
troponin 1.55, heme 7.4, cards made aware

## 2016-01-20 NOTE — Progress Notes (Signed)
ANTICOAGULATION CONSULT NOTE - Follow Up Consult  Pharmacy Consult for heparin Indication: NSTEMI  Labs:  Recent Labs  01/18/2016 0819  02/01/2016 1104 01/14/2016 1114 01/31/2016 1420 01/21/2016 2056 01/20/16 0527  HGB 8.0*  < > 8.5* 8.8*  --   --  7.4*  HCT 24.7*  --  25.4* 26.0*  --   --  23.4*  PLT 275  --  295  --   --   --  287  APTT  --   --  26  --   --   --   --   LABPROT  --   --  14.2  --   --   --   --   INR  --   --  1.09  --   --   --   --   HEPARINUNFRC  --   --   --   --   --  0.13* 0.17*  CREATININE 1.2*  --  1.21* 1.10*  --   --   --   TROPONINI  --   --   --   --  1.22*  --   --   < > = values in this interval not displayed.   Assessment: 65yo female remains subtherapeutic on heparin after rate increase.  Goal of Therapy:  Heparin level 0.3-0.7 units/ml   Plan:  Will increase heparin gtt by 3 units/kg/hr to 1250 units/hr and check level in 6hr.  Wynona Neat, PharmD, BCPS  01/20/2016,6:24 AM

## 2016-01-20 NOTE — Progress Notes (Signed)
  Echocardiogram 2D Echocardiogram has been performed.  Gina Navarro 01/20/2016, 4:51 PM

## 2016-01-20 NOTE — Progress Notes (Signed)
Report received via Denice Paradise RN in patient's room using SBAR format, reviewed orders, labs, VS, tests, meds and patient's general condition, assumed care of patient.

## 2016-01-20 NOTE — Progress Notes (Signed)
Updated report received in patient's room via Denice Paradise RN using SBAR format, updated on events of the day, assumed care of patient.

## 2016-01-20 NOTE — Progress Notes (Addendum)
SUBJECTIVE: Says SOB has significantly improved. Denies chest pain.   ROS: Other than pertinent positives in "Subjective", all others were reviewed and found to be negative.   Intake/Output Summary (Last 24 hours) at 01/20/16 0932 Last data filed at 01/20/16 0845  Gross per 24 hour  Intake          1565.82 ml  Output             2800 ml  Net         -1234.18 ml    Current Facility-Administered Medications  Medication Dose Route Frequency Provider Last Rate Last Dose  . acetaminophen (TYLENOL) tablet 1,000 mg  1,000 mg Oral On Call to [place], MD      . acetaminophen (TYLENOL) tablet 650 mg  650 mg Oral Q4H PRN Costin Karlyne Greenspan, MD      . aspirin EC tablet 81 mg  81 mg Oral Daily Costin Karlyne Greenspan, MD      . ceFAZolin (ANCEF) 3 g in dextrose 5 % 50 mL IVPB  3 g Intravenous On Call to [place], MD      . celecoxib (CELEBREX) 400 MG capsule 400 mg  400 mg Oral On Call to [place], MD      . Chlorhexidine Gluconate Cloth 2 % PADS 6 each  6 each Topical Once Erroll Luna, MD       And  . Chlorhexidine Gluconate Cloth 2 % PADS 6 each  6 each Topical Once Erroll Luna, MD      . gabapentin (NEURONTIN) capsule 300 mg  300 mg Oral On Call to [place], MD      . heparin ADULT infusion 100 units/mL (25000 units/288mL sodium chloride 0.45%)  1,250 Units/hr Intravenous Continuous Laren Everts, RPH 12.5 mL/hr at 01/20/16 0708 1,250 Units/hr at 01/20/16 0708  . insulin aspart (novoLOG) injection 0-9 Units  0-9 Units Subcutaneous TID WC Caren Griffins, MD   1 Units at 01/20/16 [phone]  . nitroGLYCERIN (NITROSTAT) SL tablet 0.4 mg  0.4 mg Sublingual Q5 Min x 3 PRN Caren Griffins, MD      . ondansetron [facility]) injection 4 mg  4 mg Intravenous Q6H PRN Caren Griffins, MD        Vitals:   01/28/2016 1637 01/20/2016 2100 01/20/16 0500 01/20/16 0738  BP: 116/70 129/60 117/60 127/62  Pulse:  (!) 112 (!) 110 (!) 106  Resp:  14 16   Temp: 98.7 F (37.1  C) 98.6 F (37 C) 97.8 F (36.6 C) 97.5 F (36.4 C)  TempSrc: Oral   Oral  SpO2: 100% 98% 98% 100%  Weight: 171 lb 11.2 oz (77.9 kg)  166 lb 14.4 oz (75.7 kg)   Height: 5\' 4"  (1.626 m)       PHYSICAL EXAM General: Well developed, well nourished WF, in no acute distress.  Head: Normocephalic, atraumatic, sclera non-icteric, no xanthomas, nares are without discharge. Alopecia. Neck: Negative for carotid bruits. JVD not elevated. Lungs:Coarse BS at bases bilaterally. Breathing is unlabored. Heart: Reg rhythm, elevated rate, with S1 S2. No murmurs, rubs, or gallops appreciated. Abdomen: Soft, non-tender, non-distended with normoactive bowel sounds. No hepatomegaly. No rebound/guarding. No obvious abdominal masses. Msk:  Strength and tone appear normal for age. Extremities: No clubbing or cyanosis. No edema.  Distal pedal pulses are 2+ and equal bilaterally. Neuro: Alert and oriented X 3. No facial asymmetry. No focal deficit. Moves all  extremities spontaneously. Psych:  Responds to questions appropriately with a normal affect.  TELEMETRY: Reviewed telemetry pt in sinus tach.  LABS: Basic Metabolic Panel:  Recent Labs  02/04/2016 1104 02/10/2016 1114 01/20/16 0527  NA 138 137 140  K 4.2 4.1 4.0  CL 106 105 106  CO2 18*  --  20*  GLUCOSE 185* 178* 134*  BUN 23* 19 25*  CREATININE 1.21* 1.10* 1.22*  CALCIUM 8.8*  --  8.5*   Liver Function Tests:  Recent Labs  01/15/2016 0819 01/22/2016 1104  AST 16 20  ALT 24 26  ALKPHOS 83 82  BILITOT 0.84 1.1  PROT 6.6 6.9  ALBUMIN 3.2* 3.8    Recent Labs  01/31/2016 1104  LIPASE 21   CBC:  Recent Labs  02/04/2016 0819  01/20/2016 1104 01/18/2016 1114 01/20/16 0527  WBC 15.7*  --  20.3*  --  11.3*  NEUTROABS 14.7*  --   --   --   --   HGB 8.0*  < > 8.5* 8.8* 7.4*  HCT 24.7*  < > 25.4* 26.0* 23.4*  MCV 91.3  --  90.7  --  92.5  PLT 275  --  295  --  287  < > = values in this interval not displayed. Cardiac Enzymes:  Recent  Labs  01/13/2016 1420 01/20/16 0527  TROPONINI 1.22* 1.55*   BNP: Invalid input(s): POCBNP D-Dimer: No results for input(s): DDIMER in the last 72 hours. Hemoglobin A1C:  Recent Labs  02/04/2016 1420  HGBA1C 8.0*   Fasting Lipid Panel:  Recent Labs  01/20/16 0527  CHOL 175  HDL 27*  LDLCALC 112*  TRIG 178*  CHOLHDL 6.5   Thyroid Function Tests:  Recent Labs  01/21/2016 1955  TSH 1.851   Anemia Panel: No results for input(s): VITAMINB12, FOLATE, FERRITIN, TIBC, IRON, RETICCTPCT in the last 72 hours.  RADIOLOGY: Dg Chest 2 View  Result Date: 02/09/2016 CLINICAL DATA:  Shortness of breath and cough for 5 days for history of breast carcinoma. EXAM: CHEST  2 VIEW COMPARISON:  Chest CT November 17, 2015 FINDINGS: There is interstitial edema, primarily in the mid and lower lung zones bilaterally. There is a minimal pleural effusion on each side. There is mild cardiomegaly with pulmonary venous hypertension. Port-A-Cath tip is in the superior vena cava. No adenopathy. No bone lesions evident. There is calcification in the right carotid artery. IMPRESSION: Findings most consistent with a degree of congestive heart failure. It should be noted that lymphangitic spread of tumor could present in this manner and must be considered a differential consideration given history of breast carcinoma. Both entities could exist concurrently. There is no frank airspace consolidation. Port-A-Cath tip in superior vena cava. Right carotid artery calcification noted. Electronically Signed   By: Lowella Grip III M.D.   On: 02/02/2016 10:08   Ct Angio Chest Pe W And/or Wo Contrast  Result Date: 02/10/2016 CLINICAL DATA:  Chest pain, shortness of breath. History of breast cancer. EXAM: CT ANGIOGRAPHY CHEST WITH CONTRAST TECHNIQUE: Multidetector CT imaging of the chest was performed using the standard protocol during bolus administration of intravenous contrast. Multiplanar CT image reconstructions and MIPs  were obtained to evaluate the vascular anatomy. CONTRAST:  52 mL of Isovue 370 intravenously. COMPARISON:  CT scan of November 17, 2015. FINDINGS: No pneumothorax is noted. Mild bilateral pleural effusions are noted, with right greater than left. Bibasilar interstitial densities are noted most consistent with pulmonary edema. There is no definite evidence of pulmonary  embolus. Atherosclerosis of thoracic aorta is noted without aneurysm or dissection. Coronary artery calcifications are noted. 8 mm right peritracheal lymph node is noted which is significantly increased compared to prior exam. Also noted is 18 mm right sub carinal lymph node concerning for possible metastatic disease. 15 mm right axillary lymph node is noted which is decreased in size compared to prior exam but concerning for metastatic disease. There is been interval placement of right internal jugular Port-A-Cath with distal tip in expected position of cavoatrial junction. No definite abnormality is seen within the visualized portion of the upper abdomen. Multilevel degenerative disc disease is noted in the lower thoracic spine. Review of the MIP images confirms the above findings. IMPRESSION: No definite evidence of pulmonary embolus. Aortic atherosclerosis. Mild bilateral pleural effusions are noted, with right greater than left. Bibasilar interstitial densities are noted most consistent with pulmonary edema. Coronary artery calcifications are noted suggesting coronary disease. Enlarged right peritracheal and right sub carinal lymph nodes are noted concerning for possible metastatic disease. Right axillary adenopathy noted on prior exam is significantly smaller in size currently, consistent with improving metastatic disease. Electronically Signed   By: Marijo Conception, M.D.   On: 02/01/2016 12:39      ASSESSMENT AND PLAN: 41F with DM, CKD stage III (pt unaware - baseline Cr 1.2-1.4), HTN (lisinopril stopped summer 2017 due to faintness),  arthritis, and recently diagnosed metastatic breast CA to axillary lymph nodes ( tx with cyclophosphamide/doxorubicin this summer), anemia felt related to chemo who presented to [facility] upon transfer from [facility] for concern for NSTEMI and acute CHF. Screening echo prior to chemo 11/2015 with normal EF. Troponin up to 1.22 on admission, up to 1.55. CTA without PE but + suspicion of CHF, coronary calcifications, and enlarged right peritracheal and right sub carinal lymph nodes are noted concerning for possible metastatic disease.  1. NSTEMI with suspected acute CHF - etiology could be ischemic in nature versus chemotherapy induced. Continue aspirin and heparin. Hold off BB given acute decompensation. BP too soft to add ACEI just yet. Lipids show LDL 112. Chest pain free at present time. Check 2D echo, TSH normal.  Cath Monday.  2. CKD III - will need to follow given contrast with CT. SCr 1.22 today.  3. Breast CA - may need oncology input regarding peritracheal/subcarinal findings on CT - will defer to primary team to review.  4. HTN - taken off ACEI due to faintness/BP dropping. Follow.  5. Anemia: Hgb 8.8-->7.4. Follow.    Kate Sable, M.D., F.A.C.C.

## 2016-01-20 NOTE — Progress Notes (Signed)
ANTICOAGULATION CONSULT NOTE - Follow Up Consult  Pharmacy Consult for heparin Indication: NSTEMI  Labs:  Recent Labs  01/29/2016 0819  01/30/2016 1104 02/06/2016 1114 02/10/2016 1420 02/05/2016 2056 01/20/16 0527 01/20/16 1514  HGB 8.0*  < > 8.5* 8.8*  --   --  7.4*  --   HCT 24.7*  --  25.4* 26.0*  --   --  23.4*  --   PLT 275  --  295  --   --   --  287  --   APTT  --   --  26  --   --   --   --   --   LABPROT  --   --  14.2  --   --   --   --   --   INR  --   --  1.09  --   --   --   --   --   HEPARINUNFRC  --   --   --   --   --  0.13* 0.17* 0.26*  CREATININE 1.2*  --  1.21* 1.10*  --   --  1.22*  --   TROPONINI  --   --   --   --  1.22*  --  1.55*  --   < > = values in this interval not displayed.   Assessment: 65yo female remain on heparin for ACS/NSTEMI. Heparin level subtherapeutic at 0.26 on heparin infusion at 1250 units/hr. Noted plans for cath Monday.   Goal of Therapy:  Heparin level 0.3-0.7 units/ml   Plan:  1. Increase heparin to 1400 units/hr 2. Heparin level in 6 hours   Vincenza Hews, PharmD, BCPS 01/20/2016, 4:52 PM Pager: [phone]

## 2016-01-21 ENCOUNTER — Other Ambulatory Visit: Payer: Self-pay

## 2016-01-21 DIAGNOSIS — D649 Anemia, unspecified: Secondary | ICD-10-CM

## 2016-01-21 LAB — BASIC METABOLIC PANEL
ANION GAP: 14 (ref 5–15)
BUN: 26 mg/dL — ABNORMAL HIGH (ref 6–20)
CHLORIDE: 107 mmol/L (ref 101–111)
CO2: 18 mmol/L — AB (ref 22–32)
CREATININE: 1.09 mg/dL — AB (ref 0.44–1.00)
Calcium: 8.7 mg/dL — ABNORMAL LOW (ref 8.9–10.3)
GFR calc non Af Amer: 53 mL/min — ABNORMAL LOW (ref >60)
Glucose, Bld: 131 mg/dL — ABNORMAL HIGH (ref 65–99)
Potassium: 3.7 mmol/L (ref 3.5–5.1)
Sodium: 139 mmol/L (ref 135–145)

## 2016-01-21 LAB — CBC
HEMATOCRIT: 25.6 % — AB (ref 36.0–46.0)
HEMOGLOBIN: 8 g/dL — AB (ref 12.0–15.0)
MCH: 29.6 pg (ref 26.0–34.0)
MCHC: 31.3 g/dL (ref 30.0–36.0)
MCV: 94.8 fL (ref 78.0–100.0)
Platelets: 356 10*3/uL (ref 150–400)
RBC: 2.7 MIL/uL — ABNORMAL LOW (ref 3.87–5.11)
RDW: 17.5 % — ABNORMAL HIGH (ref 11.5–15.5)
WBC: 8.6 10*3/uL (ref 4.0–10.5)

## 2016-01-21 LAB — PROTIME-INR
INR: 1.02
PROTHROMBIN TIME: 13.4 s (ref 11.4–15.2)

## 2016-01-21 LAB — URINE CULTURE

## 2016-01-21 LAB — GLUCOSE, CAPILLARY
GLUCOSE-CAPILLARY: 165 mg/dL — AB (ref 65–99)
Glucose-Capillary: 150 mg/dL — ABNORMAL HIGH (ref 65–99)
Glucose-Capillary: 153 mg/dL — ABNORMAL HIGH (ref 65–99)
Glucose-Capillary: 277 mg/dL — ABNORMAL HIGH (ref 65–99)

## 2016-01-21 LAB — HEPARIN LEVEL (UNFRACTIONATED)
HEPARIN UNFRACTIONATED: 0.32 [IU]/mL (ref 0.30–0.70)
Heparin Unfractionated: 0.52 IU/mL (ref 0.30–0.70)

## 2016-01-21 LAB — MAGNESIUM: Magnesium: 2.2 mg/dL (ref 1.7–2.4)

## 2016-01-21 MED ORDER — SODIUM CHLORIDE 0.9% FLUSH: 3.0000 mL | INTRAVENOUS | Status: DC | PRN

## 2016-01-21 MED ORDER — SODIUM CHLORIDE 0.9 % IV SOLN: 250.0000 mL | INTRAVENOUS | Status: DC | PRN

## 2016-01-21 MED ORDER — SODIUM CHLORIDE 0.9 % WEIGHT BASED INFUSION
3.0000 mL/kg/h | INTRAVENOUS | Status: DC
Start: 2016-01-22 — End: 2016-01-22

## 2016-01-21 MED ORDER — ASPIRIN 81 MG PO CHEW
81.0000 mg | CHEWABLE_TABLET | ORAL | Status: AC
  Administered 2016-01-22: 81 mg via ORAL
  Filled 2016-01-21: qty 1

## 2016-01-21 MED ORDER — SODIUM CHLORIDE 0.9% FLUSH
3.0000 mL | Freq: Two times a day (BID) | INTRAVENOUS | Status: DC
Start: 2016-01-21 — End: 2016-01-22
  Administered 2016-01-21: 3 mL via INTRAVENOUS

## 2016-01-21 MED ORDER — SODIUM CHLORIDE 0.9 % WEIGHT BASED INFUSION: 1.0000 mL/kg/h | INTRAVENOUS | Status: DC

## 2016-01-21 NOTE — Progress Notes (Signed)
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 Einar Gip 3-4 years ago. She would prefer not to see Dr. Einar Gip this hospitalization  -patient presented with typical symptoms with chest pain and dyspnea with activity and relieved by rest, for a week and with positive troponin  -  heparin gtt, asa, not a candidate for statin due to allergy, avoid initiating betablocker in the acute setting per cardiology recommendation, plan to cath on 9/11   Acute CHF, unknown type - clinically she is fluid overload, with pulmonary edema on chest x ray, lower extremity edema, DOE, now cough , worse at night - 2D echo pending - concern for ischemic cardiomyopathy vs doxorubicin induced as she just completed chemo 2 weeks ago -trial of iv lasixx1 on 9/9, on 9/10 lower extremity edema has resolved, reported less cough. Cr improving from 1.22 on 9/9 to 1.09 on 9/10.   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 repeat A1C 8.0  on 9/8 - hold home medications, place pt on SSI, carb modified diet  HLD - ldl 112, triglyceride 178 -allergic to statins, consider zetia  H/o HTN -  normotensive in the ER, not on bp meds at home  State IIIA inavasive ductal carcinoma,  ER+PR+ breast cancer ,under neoadjuvant chemotheray last chemo with doxorubicin/cytoxan on 8/25 with supportive GCSF injection  per Oncologist Dr Jana Hakim plan to finish 4cycles of Ac then, paclitazel x2, then surgery, then adjuvant XRT.  CTA chest this admission showed : Enlarged right peritracheal and right sub carinal lymph nodes are noted concerning for possible metastatic disease.  Right axillary adenopathy noted on prior exam is significantly smaller in size currently, consistent with improving metastatic disease.  Will discuss result with Dr Jana Hakim on Monday  DVT prophylaxis: Heparin infusion  Code Status: Full  Family Communication: no family bedside, friend present Disposition Plan: remain in patient   Consultants:  cardiology  Procedures:  Cardiac cath planned on 9/11  Antibiotics:  none   Objective: BP 127/61 (BP Location: Left Arm)   Pulse (!) 103   Temp 97.5 F (36.4 C) (Oral)   Resp 18   Ht 5\' 4"  (1.626 m)   Wt 74.5 kg (164 lb 4.8 oz)   LMP 05/13/1996   SpO2 99%   BMI 28.20 kg/m   Intake/Output Summary (Last 24 hours) at 01/21/16 0809 Last data filed at 01/21/16 0700  Gross per 24 hour  Intake          1740.69 ml  Output             2500 ml  Net          -759.31 ml   Filed Weights   01/12/2016 1637 01/20/16 0500 01/21/16 0500  Weight:  77.9 kg (171 lb 11.2 oz) 75.7 kg (166 lb 14.4 oz) 74.5 kg (164 lb 4.8 oz)    Exam:   General:  NAD, allopecia  Cardiovascular: sinsus tachycardia  Respiratory: diminished at basis  Abdomen: Soft/ND/NT, positive BS  Musculoskeletal: edema observed on admission, has resolved.  Neuro: aaox3  Data Reviewed: Basic Metabolic Panel:  Recent Labs Lab 02/04/2016 0819 01/12/2016 1104 02/09/2016 1114 01/20/16 0527 01/21/16 0618  NA 138 138 137 140 139  K 4.0 4.2 4.1 4.0 3.7  CL  --  106 105 106 107  CO2 16* 18*  --  20* 18*  GLUCOSE 239* 185* 178* 134* 131*  BUN 20.8 23* 19 25* 26*  CREATININE 1.2*  1.21* 1.10* 1.22* 1.09*  CALCIUM 8.9 8.8*  --  8.5* 8.7*  MG  --   --   --   --  2.2   Liver Function Tests:  Recent Labs Lab 02/01/2016 0819 02/05/2016 1104  AST 16 20  ALT 24 26  ALKPHOS 83 82  BILITOT 0.84 1.1  PROT 6.6 6.9  ALBUMIN 3.2* 3.8    Recent Labs Lab 01/21/2016 1104  LIPASE 21   No results for input(s): AMMONIA in the last 168 hours. CBC:  Recent Labs Lab 01/14/2016 0819 01/20/2016 1104 02/08/2016 1114 01/20/16 0527 01/21/16 0618  WBC 15.7* 20.3*  --  11.3* 8.6  NEUTROABS 14.7*  --   --   --   --   HGB 8.0* 8.5* 8.8* 7.4* 8.0*  HCT 24.7* 25.4* 26.0* 23.4* 25.6*  MCV 91.3 90.7  --  92.5 94.8  PLT 275 295  --  287 356   Cardiac Enzymes:    Recent Labs Lab 01/28/2016 1420 01/20/16 0527  TROPONINI 1.22* 1.55*   BNP (last 3 results) No results for input(s): BNP in the last 8760 hours.  ProBNP (last 3 results) No results for input(s): PROBNP in the last 8760 hours.  CBG:  Recent Labs Lab 01/20/16 0725 01/20/16 1151 01/20/16 1635 01/20/16 2103 01/21/16 0757  GLUCAP 134* 162* 150* 203* 150*    No results found for this or any previous visit (from the past 240 hour(s)).   Studies: No results found.  Scheduled Meds: . aspirin EC  81 mg Oral Daily  . Chlorhexidine Gluconate Cloth  6 each Topical Once   And  . Chlorhexidine Gluconate Cloth  6 each Topical Once  . insulin aspart  0-9 Units Subcutaneous TID WC    Continuous Infusions: . heparin 1,400 Units/hr (01/21/16 0400)     Time spent: 9mins  Vallerie Hentz MD, PhD  [facility] Pager [phone]. If 7PM-7AM, please contact night-coverage at [email or web address], password [facility] 01/21/2016, 8:09 AM  LOS: 2 days

## 2016-01-21 NOTE — Plan of Care (Signed)
Problem: Consults Goal: Cardiac Cath Patient Education (See Patient Education module for education specifics.) Outcome: Progressing Pt educated on cardiac catheterization with possible percutaneous coronary intervention. Pt refuses to watch cardiac cath video. Pt has no further questions at this time. No pain. Will continue to monitor.

## 2016-01-21 NOTE — Progress Notes (Signed)
ANTICOAGULATION CONSULT NOTE - Follow Up Consult  Pharmacy Consult for heparin Indication: NSTEMI  Labs:  Recent Labs  02/02/2016 1104 01/22/2016 1114 02/07/2016 1420  01/20/16 0527 01/20/16 1514 01/20/16 2338 01/21/16 0618  HGB 8.5* 8.8*  --   --  7.4*  --   --  8.0*  HCT 25.4* 26.0*  --   --  23.4*  --   --  25.6*  PLT 295  --   --   --  287  --   --  356  APTT 26  --   --   --   --   --   --   --   LABPROT 14.2  --   --   --   --   --   --   --   INR 1.09  --   --   --   --   --   --   --   HEPARINUNFRC  --   --   --   < > 0.17* 0.26* 0.32 0.52  CREATININE 1.21* 1.10*  --   --  1.22*  --   --  1.09*  TROPONINI  --   --  1.22*  --  1.55*  --   --   --   < > = values in this interval not displayed.   Assessment: 65yo female remain on heparin for ACS/NSTEMI. Heparin level therapeutic on 1400 units/hr. Noted plans for cath Monday. hgb low stable, pltc wnl.   Goal of Therapy:  Heparin level 0.3-0.7 units/ml   Plan:  1. Continue heparin at 1400 units/hr 2. Daily heparin level, CBC 3. Monitor for s/sx bleeding 3.   F/u heparin post-cath monday   Carlean Jews, Pharm.D. PGY1 Pharmacy Resident 9/10/201711:19 AM Pager [phone]

## 2016-01-21 NOTE — Progress Notes (Signed)
ANTICOAGULATION CONSULT NOTE - Follow Up Consult  Pharmacy Consult for heparin Indication: NSTEMI  Labs:  Recent Labs  01/20/2016 0819  01/17/2016 1104 01/22/2016 1114 01/13/2016 1420  01/20/16 0527 01/20/16 1514 01/20/16 2338  HGB 8.0*  < > 8.5* 8.8*  --   --  7.4*  --   --   HCT 24.7*  --  25.4* 26.0*  --   --  23.4*  --   --   PLT 275  --  295  --   --   --  287  --   --   APTT  --   --  26  --   --   --   --   --   --   LABPROT  --   --  14.2  --   --   --   --   --   --   INR  --   --  1.09  --   --   --   --   --   --   HEPARINUNFRC  --   --   --   --   --   < > 0.17* 0.26* 0.32  CREATININE 1.2*  --  1.21* 1.10*  --   --  1.22*  --   --   TROPONINI  --   --   --   --  1.22*  --  1.55*  --   --   < > = values in this interval not displayed.   Assessment: 65yo female remain on heparin for ACS/NSTEMI. Heparin level therapeutic on 1400 units/hr. Noted plans for cath Monday. hgb 7.4, plts wnl.  Goal of Therapy:  Heparin level 0.3-0.7 units/ml   Plan:  1. Continue heparin at 1400 units/hr 2. Daily HL, CBC 3.   Monitor plans for cath    Hughes Better, PharmD, BCPS Clinical Pharmacist 01/21/2016 12:49 AM

## 2016-01-21 NOTE — Progress Notes (Signed)
SUBJECTIVE: Dyspneic with exertion, no chest pain.   ROS: Other than pertinent positives in "Subjective", all others were reviewed and found to be negative.   Intake/Output Summary (Last 24 hours) at 01/21/16 1040 Last data filed at 01/21/16 [id number]  Gross per 24 hour  Intake          1512.17 ml  Output             2000 ml  Net          -487.83 ml    Current Facility-Administered Medications  Medication Dose Route Frequency Provider Last Rate Last Dose  . acetaminophen (TYLENOL) tablet 650 mg  650 mg Oral Q4H PRN Costin Karlyne Greenspan, MD      . aspirin EC tablet 81 mg  81 mg Oral Daily Caren Griffins, MD   81 mg at 01/21/16 0810  . Chlorhexidine Gluconate Cloth 2 % PADS 6 each  6 each Topical Once Erroll Luna, MD       And  . Chlorhexidine Gluconate Cloth 2 % PADS 6 each  6 each Topical Once Erroll Luna, MD      . heparin ADULT infusion 100 units/mL (25000 units/263mL sodium chloride 0.45%)  1,400 Units/hr Intravenous Continuous Florencia Reasons, MD 14 mL/hr at 01/21/16 0400 1,400 Units/hr at 01/21/16 0400  . insulin aspart (novoLOG) injection 0-9 Units  0-9 Units Subcutaneous TID WC Costin Karlyne Greenspan, MD   1 Units at 01/21/16 0810  . nitroGLYCERIN (NITROSTAT) SL tablet 0.4 mg  0.4 mg Sublingual Q5 Min x 3 PRN Caren Griffins, MD      . ondansetron [facility]) injection 4 mg  4 mg Intravenous Q6H PRN Caren Griffins, MD        Vitals:   01/20/16 1131 01/20/16 1433 01/20/16 2103 01/21/16 0500  BP: (!) 111/59 (!) 112/57 130/63 127/61  Pulse: (!) 110 (!) 111 (!) 106 (!) 103  Resp: 18 18    Temp:  98.6 F (37 C) 97.6 F (36.4 C) 97.5 F (36.4 C)  TempSrc:  Oral Oral Oral  SpO2: 100% 94% 100% 99%  Weight:    164 lb 4.8 oz (74.5 kg)  Height:        PHYSICAL EXAM General: Well developed, well nourished WF, in no acute distress.  Head: Normocephalic, atraumatic, sclera non-icteric, no xanthomas, nares are without discharge. Alopecia. Neck:Negative for carotid bruits. JVD not  elevated. Lungs:Coarse BS at bases bilaterally. Breathing is unlabored. Heart: Reg rhythm, elevated rate,with S1 S2. No murmurs, rubs, or gallops appreciated. Abdomen: Soft, non-tender, non-distended with normoactive bowel sounds. No hepatomegaly. No rebound/guarding. No obvious abdominal masses. [id number] and tone appear normal for age. Extremities: No clubbing or cyanosis. No edema. Distal pedal pulses are 2+ and equal bilaterally. Neuro:Alert and oriented X 3. No facial asymmetry. No focal deficit. Moves all extremities spontaneously. Psych: Responds to questions appropriately with a normal affect.  LABS: Basic Metabolic Panel:  Recent Labs  01/20/16 0527 01/21/16 0618  NA 140 139  K 4.0 3.7  CL 106 107  CO2 20* 18*  GLUCOSE 134* 131*  BUN 25* 26*  CREATININE 1.22* 1.09*  CALCIUM 8.5* 8.7*  MG  --  2.2   Liver Function Tests:  Recent Labs  01/25/2016 0819 01/13/2016 1104  AST 16 20  ALT 24 26  ALKPHOS 83 82  BILITOT 0.84 1.1  PROT 6.6 6.9  ALBUMIN 3.2* 3.8    Recent Labs  01/27/2016 1104  LIPASE  21   CBC:  Recent Labs  01/28/2016 0819  01/20/16 0527 01/21/16 0618  WBC 15.7*  < > 11.3* 8.6  NEUTROABS 14.7*  --   --   --   HGB 8.0*  < > 7.4* 8.0*  HCT 24.7*  < > 23.4* 25.6*  MCV 91.3  < > 92.5 94.8  PLT 275  < > 287 356  < > = values in this interval not displayed. Cardiac Enzymes:  Recent Labs  01/18/2016 1420 01/20/16 0527  TROPONINI 1.22* 1.55*   BNP: Invalid input(s): POCBNP D-Dimer: No results for input(s): DDIMER in the last 72 hours. Hemoglobin A1C:  Recent Labs  01/29/2016 1420  HGBA1C 8.0*   Fasting Lipid Panel:  Recent Labs  01/20/16 0527  CHOL 175  HDL 27*  LDLCALC 112*  TRIG 178*  CHOLHDL 6.5   Thyroid Function Tests:  Recent Labs  02/09/2016 1955  TSH 1.851   Anemia Panel: No results for input(s): VITAMINB12, FOLATE, FERRITIN, TIBC, IRON, RETICCTPCT in the last 72 hours.  RADIOLOGY: Dg Chest 2 View  Result  Date: 02/03/2016 CLINICAL DATA:  Shortness of breath and cough for 5 days for history of breast carcinoma. EXAM: CHEST  2 VIEW COMPARISON:  Chest CT November 17, 2015 FINDINGS: There is interstitial edema, primarily in the mid and lower lung zones bilaterally. There is a minimal pleural effusion on each side. There is mild cardiomegaly with pulmonary venous hypertension. Port-A-Cath tip is in the superior vena cava. No adenopathy. No bone lesions evident. There is calcification in the right carotid artery. IMPRESSION: Findings most consistent with a degree of congestive heart failure. It should be noted that lymphangitic spread of tumor could present in this manner and must be considered a differential consideration given history of breast carcinoma. Both entities could exist concurrently. There is no frank airspace consolidation. Port-A-Cath tip in superior vena cava. Right carotid artery calcification noted. Electronically Signed   By: Lowella Grip III M.D.   On: 02/02/2016 10:08   Ct Angio Chest Pe W And/or Wo Contrast  Result Date: 01/18/2016 CLINICAL DATA:  Chest pain, shortness of breath. History of breast cancer. EXAM: CT ANGIOGRAPHY CHEST WITH CONTRAST TECHNIQUE: Multidetector CT imaging of the chest was performed using the standard protocol during bolus administration of intravenous contrast. Multiplanar CT image reconstructions and MIPs were obtained to evaluate the vascular anatomy. CONTRAST:  52 mL of Isovue 370 intravenously. COMPARISON:  CT scan of November 17, 2015. FINDINGS: No pneumothorax is noted. Mild bilateral pleural effusions are noted, with right greater than left. Bibasilar interstitial densities are noted most consistent with pulmonary edema. There is no definite evidence of pulmonary embolus. Atherosclerosis of thoracic aorta is noted without aneurysm or dissection. Coronary artery calcifications are noted. 8 mm right peritracheal lymph node is noted which is significantly increased compared  to prior exam. Also noted is 18 mm right sub carinal lymph node concerning for possible metastatic disease. 15 mm right axillary lymph node is noted which is decreased in size compared to prior exam but concerning for metastatic disease. There is been interval placement of right internal jugular Port-A-Cath with distal tip in expected position of cavoatrial junction. No definite abnormality is seen within the visualized portion of the upper abdomen. Multilevel degenerative disc disease is noted in the lower thoracic spine. Review of the MIP images confirms the above findings. IMPRESSION: No definite evidence of pulmonary embolus. Aortic atherosclerosis. Mild bilateral pleural effusions are noted, with right greater than left. Bibasilar  interstitial densities are noted most consistent with pulmonary edema. Coronary artery calcifications are noted suggesting coronary disease. Enlarged right peritracheal and right sub carinal lymph nodes are noted concerning for possible metastatic disease. Right axillary adenopathy noted on prior exam is significantly smaller in size currently, consistent with improving metastatic disease. Electronically Signed   By: Marijo Conception, M.D.   On: 01/22/2016 12:39      ASSESSMENT AND PLAN: 38F with DM, CKD stage III (pt unaware - baseline Cr 1.2-1.4), HTN (lisinopril stopped summer 2017 due to faintness), arthritis, and recently diagnosed metastatic breast CA to axillary lymph nodes ( tx with cyclophosphamide/doxorubicin this summer), anemia felt related to chemo who presented to [facility] upon transfer from [facility] for concern for NSTEMI and acute CHF. Screening Echo prior to chemo 11/2015 with normal EF, down to 40-45%. Troponin up to 1.22 on admission, peaked at 1.55. CTA without PE but + suspicion of CHF, coronary calcifications, and enlarged right peritracheal and right sub carinal lymph nodes are noted concerning for possible metastatic disease.  1. NSTEMI with suspected acute CHF -  etiology could be ischemic in nature versus chemotherapy induced. Continue aspirin and heparin. Hold off BB given acute decompensation. BP had been slightly low to add ACEI just yet. Lipids show LDL 112. Chest pain free at present time. Echo showed EF has declined to 40-45%.  Cath Monday.  2. CKD III - will need to follow given contrast with CT. SCr 1.09 today.  3. Breast CA - may need oncology input regarding peritracheal/subcarinal findings on CT - will defer to primary team to review.  4. HTN - taken off ACEI due to faintness/BP dropping. Follow.  5. Anemia: Hgb 8.8-->7.4.-->8 Follow.     Kate Sable, M.D., F.A.C.C.

## 2016-01-22 ENCOUNTER — Inpatient Hospital Stay (HOSPITAL_COMMUNITY): Admission: EM | Disposition: E | Payer: Self-pay | Source: Home / Self Care | Attending: Internal Medicine

## 2016-01-22 ENCOUNTER — Encounter (HOSPITAL_COMMUNITY): Payer: Self-pay | Admitting: Cardiovascular Disease

## 2016-01-22 ENCOUNTER — Other Ambulatory Visit: Payer: Self-pay | Admitting: Oncology

## 2016-01-22 ENCOUNTER — Other Ambulatory Visit: Payer: Self-pay | Admitting: *Deleted

## 2016-01-22 DIAGNOSIS — R778 Other specified abnormalities of plasma proteins: Secondary | ICD-10-CM

## 2016-01-22 DIAGNOSIS — I2510 Atherosclerotic heart disease of native coronary artery without angina pectoris: Secondary | ICD-10-CM

## 2016-01-22 DIAGNOSIS — C50919 Malignant neoplasm of unspecified site of unspecified female breast: Secondary | ICD-10-CM

## 2016-01-22 DIAGNOSIS — R7989 Other specified abnormal findings of blood chemistry: Secondary | ICD-10-CM

## 2016-01-22 DIAGNOSIS — R079 Chest pain, unspecified: Secondary | ICD-10-CM

## 2016-01-22 DIAGNOSIS — R05 Cough: Secondary | ICD-10-CM

## 2016-01-22 DIAGNOSIS — R0602 Shortness of breath: Secondary | ICD-10-CM

## 2016-01-22 DIAGNOSIS — I25110 Atherosclerotic heart disease of native coronary artery with unstable angina pectoris: Secondary | ICD-10-CM

## 2016-01-22 DIAGNOSIS — R000 Tachycardia, unspecified: Secondary | ICD-10-CM

## 2016-01-22 HISTORY — PX: CARDIAC CATHETERIZATION: SHX172

## 2016-01-22 LAB — CBC
HCT: 25.8 % — ABNORMAL LOW (ref 36.0–46.0)
HEMOGLOBIN: 8.2 g/dL — AB (ref 12.0–15.0)
MCH: 29.6 pg (ref 26.0–34.0)
MCHC: 31.8 g/dL (ref 30.0–36.0)
MCV: 93.1 fL (ref 78.0–100.0)
PLATELETS: 379 10*3/uL (ref 150–400)
RBC: 2.77 MIL/uL — AB (ref 3.87–5.11)
RDW: 17.3 % — ABNORMAL HIGH (ref 11.5–15.5)
WBC: 7.5 10*3/uL (ref 4.0–10.5)

## 2016-01-22 LAB — BASIC METABOLIC PANEL
ANION GAP: 10 (ref 5–15)
BUN: 23 mg/dL — AB (ref 6–20)
CALCIUM: 8.9 mg/dL (ref 8.9–10.3)
CO2: 23 mmol/L (ref 22–32)
Chloride: 108 mmol/L (ref 101–111)
Creatinine, Ser: 1.03 mg/dL — ABNORMAL HIGH (ref 0.44–1.00)
GFR calc Af Amer: >60 mL/min (ref >60)
GFR, EST NON AFRICAN AMERICAN: 57 mL/min — AB (ref >60)
GLUCOSE: 176 mg/dL — AB (ref 65–99)
Potassium: 3.8 mmol/L (ref 3.5–5.1)
Sodium: 141 mmol/L (ref 135–145)

## 2016-01-22 LAB — GLUCOSE, CAPILLARY
GLUCOSE-CAPILLARY: 153 mg/dL — AB (ref 65–99)
GLUCOSE-CAPILLARY: 166 mg/dL — AB (ref 65–99)
Glucose-Capillary: 182 mg/dL — ABNORMAL HIGH (ref 65–99)
Glucose-Capillary: 242 mg/dL — ABNORMAL HIGH (ref 65–99)

## 2016-01-22 LAB — HEPARIN LEVEL (UNFRACTIONATED): HEPARIN UNFRACTIONATED: 0.48 [IU]/mL (ref 0.30–0.70)

## 2016-01-22 SURGERY — LEFT HEART CATH AND CORONARY ANGIOGRAPHY
Anesthesia: LOCAL

## 2016-01-22 MED ORDER — FENTANYL CITRATE (PF) 100 MCG/2ML IJ SOLN
INTRAMUSCULAR | Status: AC
  Filled 2016-01-22: qty 2

## 2016-01-22 MED ORDER — VERAPAMIL HCL 2.5 MG/ML IV SOLN
INTRAVENOUS | Status: AC
  Filled 2016-01-22: qty 2

## 2016-01-22 MED ORDER — LIDOCAINE HCL (PF) 1 % IJ SOLN
INTRAMUSCULAR | Status: AC
Start: 2016-01-22 — End: 2016-01-22
  Filled 2016-01-22: qty 30

## 2016-01-22 MED ORDER — IOPAMIDOL (ISOVUE-370) INJECTION 76%
INTRAVENOUS | Status: AC
  Filled 2016-01-22: qty 100

## 2016-01-22 MED ORDER — IOPAMIDOL (ISOVUE-370) INJECTION 76%
INTRAVENOUS | Status: AC
  Filled 2016-01-22: qty 50

## 2016-01-22 MED ORDER — HEPARIN SODIUM (PORCINE) 1000 UNIT/ML IJ SOLN
INTRAMUSCULAR | Status: DC | PRN
  Administered 2016-01-22: 4000 [IU] via INTRAVENOUS

## 2016-01-22 MED ORDER — HEPARIN SODIUM (PORCINE) 1000 UNIT/ML IJ SOLN
INTRAMUSCULAR | Status: AC
  Filled 2016-01-22: qty 1

## 2016-01-22 MED ORDER — SODIUM CHLORIDE 0.9% FLUSH: 3.0000 mL | INTRAVENOUS | Status: DC | PRN

## 2016-01-22 MED ORDER — IOPAMIDOL (ISOVUE-370) INJECTION 76%
INTRAVENOUS | Status: DC | PRN
  Administered 2016-01-22: 90 mL

## 2016-01-22 MED ORDER — HEPARIN (PORCINE) IN NACL 2-0.9 UNIT/ML-% IJ SOLN
INTRAMUSCULAR | Status: AC
  Filled 2016-01-22: qty 1500

## 2016-01-22 MED ORDER — SODIUM CHLORIDE 0.9 % IV SOLN: 250.0000 mL | INTRAVENOUS | Status: DC | PRN

## 2016-01-22 MED ORDER — MIDAZOLAM HCL 2 MG/2ML IJ SOLN
INTRAMUSCULAR | Status: AC
  Filled 2016-01-22: qty 2

## 2016-01-22 MED ORDER — SODIUM CHLORIDE 0.9 % IV SOLN: INTRAVENOUS | Status: AC

## 2016-01-22 MED ORDER — FENTANYL CITRATE (PF) 100 MCG/2ML IJ SOLN
INTRAMUSCULAR | Status: DC | PRN
  Administered 2016-01-22 (×2): 25 ug via INTRAVENOUS

## 2016-01-22 MED ORDER — HEPARIN (PORCINE) IN NACL 2-0.9 UNIT/ML-% IJ SOLN
INTRAMUSCULAR | Status: DC | PRN
  Administered 2016-01-22: 10 mL via INTRA_ARTERIAL

## 2016-01-22 MED ORDER — SODIUM CHLORIDE 0.9% FLUSH
3.0000 mL | Freq: Two times a day (BID) | INTRAVENOUS | Status: DC
  Administered 2016-01-22: 3 mL via INTRAVENOUS

## 2016-01-22 MED ORDER — MIDAZOLAM HCL 2 MG/2ML IJ SOLN
INTRAMUSCULAR | Status: DC | PRN
  Administered 2016-01-22 (×2): 1 mg via INTRAVENOUS

## 2016-01-22 MED ORDER — HEPARIN (PORCINE) IN NACL 100-0.45 UNIT/ML-% IJ SOLN
1400.0000 [IU]/h | INTRAMUSCULAR | Status: DC
  Administered 2016-01-22: 1400 [IU]/h via INTRAVENOUS
  Filled 2016-01-22 (×2): qty 250

## 2016-01-22 MED ORDER — HEPARIN (PORCINE) IN NACL 2-0.9 UNIT/ML-% IJ SOLN
INTRAMUSCULAR | Status: DC | PRN
  Administered 2016-01-22: 1500 mL

## 2016-01-22 SURGICAL SUPPLY — 10 items
CATH INFINITI 5 FR JL3.5 (CATHETERS) ×1 IMPLANT
CATH INFINITI JR4 5F (CATHETERS) ×1 IMPLANT
DEVICE RAD COMP TR BAND LRG (VASCULAR PRODUCTS) ×1 IMPLANT
GLIDESHEATH SLEND SS 6F .021 (SHEATH) ×1 IMPLANT
KIT HEART LEFT (KITS) ×2 IMPLANT
PACK CARDIAC CATHETERIZATION (CUSTOM PROCEDURE TRAY) ×2 IMPLANT
TRANSDUCER W/STOPCOCK (MISCELLANEOUS) ×2 IMPLANT
TUBING CIL FLEX 10 FLL-RA (TUBING) ×2 IMPLANT
WIRE EMERALD 3MM-J .035X260CM (WIRE) ×1 IMPLANT
WIRE HI TORQ VERSACORE-J 145CM (WIRE) ×1 IMPLANT

## 2016-01-22 NOTE — Progress Notes (Signed)
ANTICOAGULATION CONSULT NOTE - Follow Up Consult  Pharmacy Consult for Heparin Indication: chest pain/ACS   Assessment: Gina Navarro is a 37 yoF who was admitted on 9/8 for NSTEMI. Heparin level is therapeutic today at 0.48. Hemoglobin and platelets are stable with no bleeding noted.  S/p cath this morning, found to have multivessel CAD. Surgery consulted to weigh in on CABG vs. High risk pci. Difficult situation due to need for possible breat surgery soon making a drug eluding stent not optimal.  Orders post-cath to resume heparin for now. Level was at goal this morning will restart at that rate and checking level with morning labs.   Goal of Therapy:  Heparin level 0.3-0.7 units/ml Monitor platelets by anticoagulation protocol: Yes   Plan:  Restart heparin at 1400 units/hr tonight  Monitor daily heparin level, CBC, and s/sx' of bleeding Follow-up surgery consult   Allergies  Allergen Reactions  . Bee Venom   . Statins Other (See Comments)    MYALGIAS with Crestor, Zocor. Tolerating Lipitor (05/2013)  . Victoza [Liraglutide]     Dizziness, peripheral neuropathy, constipation, heart palpitations    Patient Measurements: Height: 5\' 4"  (162.6 cm) Weight: 164 lb 6.4 oz (74.6 kg) IBW/kg (Calculated) : 54.7 Heparin Dosing Weight: 71.2 kg  Vital Signs: Temp: 97.8 F (36.6 C) (09/11 0508) Temp Source: Oral (09/11 0508) BP: 133/64 (09/11 1155) Pulse Rate: 116 (09/11 1155)  Labs:  Recent Labs  01/12/2016 1420  01/20/16 0527  01/20/16 2338 01/21/16 0618 01/21/16 1059 02/04/2016 0605  HGB  --   < > 7.4*  --   --  8.0*  --  8.2*  HCT  --   --  23.4*  --   --  25.6*  --  25.8*  PLT  --   --  287  --   --  356  --  379  LABPROT  --   --   --   --   --   --  13.4  --   INR  --   --   --   --   --   --  1.02  --   HEPARINUNFRC  --   < > 0.17*  < > 0.32 0.52  --  0.48  CREATININE  --   --  1.22*  --   --  1.09*  --  1.03*  TROPONINI 1.22*  --  1.55*  --   --   --   --   --   < > =  values in this interval not displayed.  Estimated Creatinine Clearance: 55.3 mL/min (by C-G formula based on SCr of 1.03 mg/dL).   Medications:  Infusions:  . sodium chloride    . heparin     Erin Hearing PharmD., BCPS Clinical Pharmacist Pager [phone] 01/21/2016 12:12 PM

## 2016-01-22 NOTE — Consult Note (Signed)
[address]       [place] [address]             [phone]        Gina Navarro [facility] Medical Record #[id number] Date of Birth: 29-Nov-1950  Referring: Dr Angelena Form Primary Care: Harrison Mons, PA-C  Chief Complaint:    Chief Complaint  Patient presents with  . Chest Pain    History of Present Illness:      Gina Navarro is a 65 yo white female currently undergoing treatment for breast cancer.  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  She completed neoadjuvant chemotherapy of cyclophosphamide and doxorubicin in dose dense fashion 4 completed on 01/05/2016.  She presented to the Cancer center on 01/18/2016 to start her first of 12 cycles of Paclitaxel.  At that time the patient admitted to a several day history of chest pain and shortness of breath.  She states this awoke her from sleep the night before and her symptoms were relieved after sitting up in bed.  An EKG was obtained and showed the patient to have a fast heart rate.  There was concern she may be suffering angina vs a Pulmonary embolism.  She was sent to the ED for evaluation and her chemotherapy was postponed.  In the ED CT scan was negative for Pulmonary embolism.  Her enzymes were elevated and she was ruled in for NSTEMI.  She was transferred to [facility] for further care.  She was taken to the cath lab today and found to have multivessel CAD.  Consult being requested for possible Coronary bypass.  Currently the patient is chest pain free.  She does have shortness of breath.  She expresses that she does not want to have bypass surgery.  She states she would not be able to tolerate recovering from surgery and possibly undergoing chemotherapy at the same time.  She does not want to delay her chemotherapy either.  She states she wants to give the stent a shot and get her breast cancer treated as  quickly and effectively as possible.  Current Activity/ Functional Status: Patient is independent with mobility/ambulation, transfers, ADL's, IADL's.   Zubrod Score: At the time of surgery this patient's most appropriate activity status/level should be described as: []     0    Normal activity, no symptoms [x]     1    Restricted in physical strenuous activity but ambulatory, able to do out light work []     2    Ambulatory and capable of self care, unable to do work activities, up and about                 more than 50%  Of the time                            []     3    Only limited self care, in bed greater than 50% of waking hours []     4    Completely disabled, no self care, confined to bed or chair []     5    Moribund  Past Medical History:  Diagnosis Date  . Anemia, unspecified   . Arthritis   . Breast cancer metastasized to axillary lymph node ([place]) 11/02/2015  . CKD (chronic kidney disease), stage III   . Diabetes  mellitus ([place])    oral meds until Victoza in 07/2012, now change to Invokana  . Hypertension 2006  . Menopausal state   . Skin cancer     Past Surgical History:  Procedure Laterality Date  . MOHS SURGERY  2002   chin x 1 and right forehead X 1 in 2004  . TONSILLECTOMY AND ADENOIDECTOMY      History  Smoking Status  . Former Smoker  . Packs/day: 1.00  . Years: 5.00  . Types: Cigarettes  Smokeless Tobacco  . Never Used    Comment: Quit age 63    History  Alcohol Use No    Social History   Social History  . Marital status: Single    Spouse name: n/a  . Number of children: 0  . Years of education: 68   Occupational History  . Psychologist (PhD)    Social History Main Topics  . Smoking status: Former Smoker    Packs/day: 1.00    Years: 5.00    Types: Cigarettes  . Smokeless tobacco: Never Used     Comment: Quit age 43  . Alcohol use No  . Drug use: No  . Sexual activity: Yes    Partners: Female    Birth control/ protection: Post-menopausal    Other Topics Concern  . Not on file   Social History Narrative   Adoptive mother died in 03/24/12. Lives alone.    Allergies  Allergen Reactions  . Bee Venom   . Statins Other (See Comments)    MYALGIAS with Crestor, Zocor. Tolerating Lipitor (05/2013)  . Victoza [Liraglutide]     Dizziness, peripheral neuropathy, constipation, heart palpitations    Current Facility-Administered Medications  Medication Dose Route Frequency Provider Last Rate Last Dose  . 0.9 %  sodium chloride infusion  250 mL Intravenous PRN Burnell Blanks, MD      . 0.9 %  sodium chloride infusion   Intravenous Continuous Burnell Blanks, MD      . acetaminophen (TYLENOL) tablet 650 mg  650 mg Oral Q4H PRN Caren Griffins, MD      . aspirin EC tablet 81 mg  81 mg Oral Daily Caren Griffins, MD   81 mg at 01/21/16 0810  . heparin ADULT infusion 100 units/mL (25000 units/227m sodium chloride 0.45%)  1,400 Units/hr Intravenous Continuous FLyndee Leo RPH      . insulin aspart (novoLOG) injection 0-9 Units  0-9 Units Subcutaneous TID WC Costin MKarlyne Greenspan MD   2 Units at 02/05/2016 0800  . nitroGLYCERIN (NITROSTAT) SL tablet 0.4 mg  0.4 mg Sublingual Q5 Min x 3 PRN Costin MKarlyne Greenspan MD      . ondansetron (ZOFRAN) injection 4 mg  4 mg Intravenous Q6H PRN Costin MKarlyne Greenspan MD      . sodium chloride flush (NS) 0.9 % injection 3 mL  3 mL Intravenous Q12H CBurnell Blanks MD      . sodium chloride flush (NS) 0.9 % injection 3 mL  3 mL Intravenous PRN CBurnell Blanks MD        Prescriptions Prior to Admission  Medication Sig Dispense Refill Last Dose  . canagliflozin (INVOKANA) 300 MG TABS tablet Take 1 tablet (300 mg total) by mouth daily before breakfast. 90 tablet 3 02/03/2016 at Unknown time  . cholecalciferol (VITAMIN D) 1000 UNITS tablet Take 1,000 Units by mouth daily.   01/18/2016 at Unknown time  . glucosamine-chondroitin 500-400 MG tablet Take 1 tablet by  mouth 3 (three) times daily.    01/18/2016 at Unknown time  . guaiFENesin (MUCINEX) 600 MG 12 hr tablet Take 600 mg by mouth 2 (two) times daily.   01/26/2016 at Unknown time  . lidocaine-prilocaine (EMLA) cream Apply to port 1-2 hours before procedure 30 g 1 01/20/2016 at Unknown time  . loratadine (CLARITIN) 10 MG tablet Take 10 mg by mouth daily.   01/18/2016 at Unknown time  . metFORMIN (GLUCOPHAGE) 1000 MG tablet Take 1 tablet (1,000 mg total)  By mouth 2 (two) times daily with a meal 180 tablet 3 01/24/2016 at Unknown time  . nitroGLYCERIN (NITROSTAT) 0.4 MG SL tablet Place 1 tablet (0.4 mg total) under the tongue every 5 (five) minutes as needed for chest pain. 25 tablet 1 never  . Blood Glucose Monitoring Suppl (BAYER CONTOUR MONITOR) w/Device KIT Test blood sugar once daily. Dx: E11.40 1 kit 0   . glucose blood (BAYER CONTOUR NEXT TEST) test strip Test blood sugar once daily. Dx: E11.40 100 each 2     Family History  Problem Relation Age of Onset  . Adopted: Yes  . Liver cancer Mother   . Lung cancer Mother   . Brain cancer Father      Review of Systems:   Constitutional: negative Eyes: negative Respiratory: positive for dyspnea on exertion and pleurisy/chest pain Cardiovascular: positive for chest pain and on admission Gastrointestinal: negative Neurological: negative     Cardiac Review of Systems: Y or N  Chest Pain [  y  ]  Resting SOB [ y  ] Exertional SOB  [ y ]  Orthopnea [  ]   Pedal Edema [   ]    Palpitations [  ] Syncope  [  ]   Presyncope [   ]  General Review of Systems: [Y] = yes [  ]=no Constitional: recent weight change [  ]; anorexia [  ]; fatigue [  ]; nausea [  ]; night sweats [  ]; fever [  ]; or chills [  ]                                                               Dental: poor dentition[  ]; Last Dentist visit:   Eye : blurred vision [  ]; diplopia [   ]; vision changes [  ];  Amaurosis fugax[  ]; Resp: cough [  ];  wheezing[  ];  hemoptysis[  ]; shortness of breath[y  ]; paroxysmal  nocturnal dyspnea[  ]; dyspnea on exertion[y  ]; or orthopnea[  ];  GI:  gallstones[  ], vomiting[  ];  dysphagia[y  ]; melena[  ];  hematochezia [  ]; heartburn[  ];   Hx of  Colonoscopy[  ]; GU: kidney stones [  ]; hematuria[  ];   dysuria [  ];  nocturia[  ];  history of     obstruction [  ]; urinary frequency [  ]             Skin: rash, swelling[  ];, hair loss[  ];  peripheral edema[  ];  or itching[  ]; Musculosketetal: myalgias[  ];  joint swelling[  ];  joint erythema[  ];  joint pain[  ];  back pain[  ];  Heme/Lymph: bruising[  ];  bleeding[  ];  anemia[  ];  Neuro: TIA[  ];  headaches[  ];  stroke[  ];  vertigo[  ];  seizures[  ];   paresthesias[  ];  difficulty walking[  ];  Psych:depression[  ]; anxiety[  ];  Endocrine: diabetes[  ];  thyroid dysfunction[  ];  Immunizations: Flu [  ]; Pneumococcal[  ];  Other:  Physical Exam: BP 133/64 (BP Location: Left Arm)   Pulse (!) 116   Temp 97.8 F (36.6 C) (Oral)   Resp (!) 30   Ht 5' 4"  (1.626 m)   Wt 164 lb 6.4 oz (74.6 kg)   LMP 05/13/1996   SpO2 96%   BMI 28.22 kg/m   General appearance: alert, cooperative and no distress Head: Normocephalic, without obvious abnormality, atraumatic Resp: clear to auscultation bilaterally Cardio: regular rate and rhythm and tachy GI: soft, non-tender; bowel sounds normal; no masses,  no organomegaly Extremities: extremities normal, atraumatic, no cyanosis or edema Neurologic: Grossly normal Well healed right subclavian port  Diagnostic Studies & Laboratory data:     Recent Radiology Findings:   No results found.   I have independently reviewed the above radiologic studies.  Recent Lab Findings: Lab Results  Component Value Date   WBC 7.5 01/21/2016   HGB 8.2 (L) 01/15/2016   HCT 25.8 (L) 01/14/2016   PLT 379 01/16/2016   GLUCOSE 176 (H) 01/15/2016   CHOL 175 01/20/2016   TRIG 178 (H) 01/20/2016   HDL 27 (L) 01/20/2016   LDLCALC 112 (H) 01/20/2016   ALT 26 01/21/2016    AST 20 01/16/2016   NA 141 01/15/2016   K 3.8 01/17/2016   CL 108 01/26/2016   CREATININE 1.03 (H) 01/31/2016   BUN 23 (H) 02/01/2016   CO2 23 01/18/2016   TSH 1.851 01/12/2016   INR 1.02 01/21/2016   HGBA1C 8.0 (H) 01/16/2016   Cath: Procedures   Left Heart Cath and Coronary Angiography  Conclusion     Prox RCA lesion, 80 %stenosed.  Mid RCA lesion, 80 %stenosed.  RPDA lesion, 30 %stenosed.  Dist RCA lesion, 30 %stenosed.  Ost LM to LM lesion, 50 %stenosed.  Ost Cx to Prox Cx lesion, 60 %stenosed.  Ost LAD to Prox LAD lesion, 90 %stenosed.  Dist LAD lesion, 50 %stenosed.  Mid LAD lesion, 90 %stenosed.   1. 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  2. The left main artery appears to have diffuse moderate disease. The catheter dampens upon engagement of the left main artery 3. The proximal LAD appears to have an ulcerated severe stenosis. The remainder of the mid and distal LAD has diffuse disease and become small caliber beyond the mid vessel.  4. The ostial Circumflex has a moderate stenosis.  5. The proximal and mid RCA has moderately severe stenosis.   Recommendations: This is a complex clinical scenario. She has metastatic breast cancer and is undergoing chemotherapy. She will need breast surgery in the next 3-4 months. She is now admitted with a NSTEMI and has diffuse three vessel CAD. The proximal LAD lesion is likely an ulcerated plaque and her culprit lesion. There is at least moderate disease in her short left main artery as well as moderately severe disease in the ostial Circumflex and several segments of the RCA. The proximal LAD could be addressed with PCI/stenting but this is complicated by the fact that she  will need to have anti-platelet therapy interrupted in several months for her breast surgery. Placing a bare metal stent in the moderate caliber proximal LAD would not be  optimal in regards to restenosis risk. A drug eluting stent in the proximal LAD would be high risk for thrombosis if anti-platelet therapy were stopped in 3 months. The distal LAD becomes small, is diffusely diseased and is not a great target for bypass. The distal RCA is a favorable target for bypass.  I will ask the CT surgery team to see her to discuss CABG. If she is not felt to be a good candidate for CABG, will have to discuss options for PCI/stenting of the LAD and RCA. As above, multi-vessel stenting is not an optimal approach due to the need to stop anti-platelet therapy in several months as well as the location of the disease in the left main, ostial Circumflex and LAD.          Assessment / Plan:      1. CAD- CABG vs. PCI-- however patient does not wish to proceed with CABG 2. 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 With her recent chemotherapy, she would be at increased risk for major cardiac intervention but probably not prohibitive. In discussing with the patient her major concern is not interrupting her current treatment of her breast cancer, she prefers a PCI intervention to coronary artery bypass grafting even if it's temporizing to complete her chemotherapy treatment. She she listened and asked questions about bypass surgery but she was very adamant she did not one of proceed with this route.   I  spent 40 minutes counseling the patient face to face and 50% or more the  time was spent in counseling and coordination of care. The total time spent in the appointment was 60 minutes.  Grace Isaac MD      [address] [place] [address] Office [phone]   [place]

## 2016-01-22 NOTE — Progress Notes (Signed)
Gina Navarro   DOB:27-Apr-1951   KW#:[id number]   JME#:[id number]  Subjective: Gina Navarro is still SOB with activity--walking to BR for instance; has a dry cough; denies chest pain or pressure; rest of ROS today is stable; no family in room   Objective: Middle-aged white woman examined in bed Vitals:   01/21/16 2145 02/01/2016 0508  BP: 117/69 (!) 114/58  Pulse: (!) 116 (!) 104  Resp:    Temp: 98 F (36.7 C) 97.8 F (36.6 C)    Body mass index is 28.22 kg/m.  Intake/Output Summary (Last 24 hours) at 02/01/2016 0849 Last data filed at 02/10/2016 0600  Gross per 24 hour  Intake           1265.2 ml  Output              300 ml  Net            965.2 ml     Sclerae unicteric  Oropharynx shows no thrush or other lesions  No cervical or supraclavicular adenopathy  Lungs no rales or wheezes--auscultated anterolaterally  Heart regular rate and rhythm  Abdomen soft, +BS  Neuro nonfocal  Breast exam: Deferred  CBG (last 3)   Recent Labs  01/21/16 1622 01/21/16 2152 02/07/2016 0734  GLUCAP 165* 277* 182*     Labs:  Lab Results  Component Value Date   WBC 7.5 01/12/2016   HGB 8.2 (L) 01/20/2016   HCT 25.8 (L) 02/04/2016   MCV 93.1 02/03/2016   PLT 379 01/15/2016   NEUTROABS 14.7 (H) 01/20/2016    _0 @  Urine Studies No results for input(s): UHGB, CRYS in the last 72 hours.  Invalid input(s): UACOL, UAPR, USPG, UPH, UTP, UGL, UKET, UBIL, UNIT, UROB, [place], UEPI, UWBC, Junie Panning [place], [place], [place]  Basic Metabolic Panel:  Recent Labs Lab 02/01/2016 [phone]  02/04/2016 1104 01/17/2016 1114 01/20/16 0527 01/21/16 0618 02/03/2016 0605  NA 138  --  138 137 140 139 141  K 4.0  < > 4.2 4.1 4.0 3.7 3.8  CL  --   --  106 105 106 107 108  CO2 16*  --  18*  --  20* 18* 23  GLUCOSE 239*  --  185* 178* 134* 131* 176*  BUN 20.8  --  23* 19 25* 26* 23*  CREATININE 1.2*  --  1.21* 1.10* 1.22* 1.09* 1.03*  CALCIUM 8.9  --  8.8*  --  8.5* 8.7* 8.9  MG  --   --   --   --   --  2.2  --    < > = values in this interval not displayed. GFR Estimated Creatinine Clearance: 55.3 mL/min (by C-G formula based on SCr of 1.03 mg/dL). Liver Function Tests:  Recent Labs Lab 01/31/2016 0819 01/18/2016 1104  AST 16 20  ALT 24 26  ALKPHOS 83 82  BILITOT 0.84 1.1  PROT 6.6 6.9  ALBUMIN 3.2* 3.8    Recent Labs Lab 01/25/2016 1104  LIPASE 21   No results for input(s): AMMONIA in the last 168 hours. Coagulation profile  Recent Labs Lab 01/18/2016 1104 01/21/16 1059  INR 1.09 1.02    CBC:  Recent Labs Lab 01/18/2016 0819 01/29/2016 1104 02/04/2016 1114 01/20/16 0527 01/21/16 0618 02/01/2016 0605  WBC 15.7* 20.3*  --  11.3* 8.6 7.5  NEUTROABS 14.7*  --   --   --   --   --   HGB 8.0* 8.5* 8.8* 7.4* 8.0* 8.2*  HCT 24.7*  25.4* 26.0* 23.4* 25.6* 25.8*  MCV 91.3 90.7  --  92.5 94.8 93.1  PLT 275 295  --  287 356 379   Cardiac Enzymes:  Recent Labs Lab 01/14/2016 1420 01/20/16 0527  TROPONINI 1.22* 1.55*   BNP: Invalid input(s): POCBNP CBG:  Recent Labs Lab 01/21/16 0757 01/21/16 1144 01/21/16 1622 01/21/16 2152 01/28/2016 0734  GLUCAP 150* 153* 165* 277* 182*   D-Dimer No results for input(s): DDIMER in the last 72 hours. Hgb A1c  Recent Labs  01/12/2016 1420  HGBA1C 8.0*   Lipid Profile  Recent Labs  01/20/16 0527  CHOL 175  HDL 27*  LDLCALC 112*  TRIG 178*  CHOLHDL 6.5   Thyroid function studies  Recent Labs  02/05/2016 1955  TSH 1.851   Anemia work up No results for input(s): VITAMINB12, FOLATE, FERRITIN, TIBC, IRON, RETICCTPCT in the last 72 hours. Microbiology Recent Results (from the past 240 hour(s))  Urine culture     Status: Abnormal   Collection Time: 01/20/16  1:29 PM  Result Value Ref Range Status   Specimen Description URINE, RANDOM  Final   Special Requests NONE  Final   Culture MULTIPLE SPECIES PRESENT, SUGGEST RECOLLECTION (A)  Final   Report Status 01/21/2016 FINAL  Final      Studies:  Dg Chest 2 View  Result Date:  01/25/2016 CLINICAL DATA:  Shortness of breath and cough for 5 days for history of breast carcinoma. EXAM: CHEST  2 VIEW COMPARISON:  Chest CT November 17, 2015 FINDINGS: There is interstitial edema, primarily in the mid and lower lung zones bilaterally. There is a minimal pleural effusion on each side. There is mild cardiomegaly with pulmonary venous hypertension. Port-A-Cath tip is in the superior vena cava. No adenopathy. No bone lesions evident. There is calcification in the right carotid artery. IMPRESSION: Findings most consistent with a degree of congestive heart failure. It should be noted that lymphangitic spread of tumor could present in this manner and must be considered a differential consideration given history of breast carcinoma. Both entities could exist concurrently. There is no frank airspace consolidation. Port-A-Cath tip in superior vena cava. Right carotid artery calcification noted. Electronically Signed   By: Lowella Grip III M.D.   On: 01/31/2016 10:08   Ct Angio Chest Pe W And/or Wo Contrast  Result Date: 01/30/2016 CLINICAL DATA:  Chest pain, shortness of breath. History of breast cancer. EXAM: CT ANGIOGRAPHY CHEST WITH CONTRAST TECHNIQUE: Multidetector CT imaging of the chest was performed using the standard protocol during bolus administration of intravenous contrast. Multiplanar CT image reconstructions and MIPs were obtained to evaluate the vascular anatomy. CONTRAST:  52 mL of Isovue 370 intravenously. COMPARISON:  CT scan of November 17, 2015. FINDINGS: No pneumothorax is noted. Mild bilateral pleural effusions are noted, with right greater than left. Bibasilar interstitial densities are noted most consistent with pulmonary edema. There is no definite evidence of pulmonary embolus. Atherosclerosis of thoracic aorta is noted without aneurysm or dissection. Coronary artery calcifications are noted. 8 mm right peritracheal lymph node is noted which is significantly increased compared to  prior exam. Also noted is 18 mm right sub carinal lymph node concerning for possible metastatic disease. 15 mm right axillary lymph node is noted which is decreased in size compared to prior exam but concerning for metastatic disease. There is been interval placement of right internal jugular Port-A-Cath with distal tip in expected position of cavoatrial junction. No definite abnormality is seen within the visualized portion of  the upper abdomen. Multilevel degenerative disc disease is noted in the lower thoracic spine. Review of the MIP images confirms the above findings. IMPRESSION: No definite evidence of pulmonary embolus. Aortic atherosclerosis. Mild bilateral pleural effusions are noted, with right greater than left. Bibasilar interstitial densities are noted most consistent with pulmonary edema. Coronary artery calcifications are noted suggesting coronary disease. Enlarged right peritracheal and right sub carinal lymph nodes are noted concerning for possible metastatic disease. Right axillary adenopathy noted on prior exam is significantly smaller in size currently, consistent with improving metastatic disease. Electronically Signed   By: Marijo Conception, M.D.   On: 01/18/2016 12:39    Assessment: 65 y.o.  woman status post right breast axillary tail lymph node biopsy 10/25/2015 for a clinical  TX N2, stage IIIA invasive ductal carcinoma, estrogen and progesterone receptor positive, HER-2 nonamplified   (1) neoadjuvant chemotherapy started 11/23/2015 consisting of cyclophosphamide and doxorubicin in dose dense fashion 4 completed 01/05/2016,  to be followed by weekly paclitaxel 12  (2) axillary lymph node dissection to follow chemotherapy  (3) adjuvant radiation to follow surgery  (4) anti-estrogens to follow at the completion of local treatment  (5) genetics testing pending  (6) s/p NSTEMI?-- cath planned for 01/30/2016  (7) CT angio Chest 01/27/2016 shows R paratracheal  and subcarinal lymph nodes which will need follow-up  Plan:  [place] is repeat echocardiogram 01/20/2016 shows a drop in her ejection fraction from 60-65% to 40-45% with mid apical/anteroseptal akinesis and grade 2 diastolic dysfunction. She is scheduled for a cardiac cath today which will give [place] more definitive information regarding the etiology of this, but she does have significant risk factors for coronary artery disease.  In my experience with doxorubicin, it is possible to have a transient weakening of systolic function during or shortly after chemotherapy, which recovers. Permanent damage to the myocardium from doxorubicin tends to occur much later. The incidence of heart damage at the dose this patient has received (240 mg/m) is significantly less than 1%.  Roxy has some right paratracheal and subcarinal lymph nodes which appear slightly increased whereas her known cancer is shrinking with treatment. My suspicion is that these do not her percent tumor but at this point there is no way to tell and we are going to continue to treat her with curative intent. That means that we will continue her chemotherapy plan and follow-up with surgery and radiation is planned. She will have a restaging PET scan in December. We can address the possibility of stage IV disease with better information at that time.  However I do not intend to proceed with chemotherapy this week. She will see me again September 22 and I hope to be able to resume her paclitaxel at that time.  Greatly appreciate your help to this patient!   Chauncey Cruel, MD 02/07/2016  8:49 AM Medical Oncology and Hematology [facility] [address] [place], [place] [address] Tel. [phone]    Fax. [phone]

## 2016-01-22 NOTE — Progress Notes (Signed)
ANTICOAGULATION CONSULT NOTE - Follow Up Consult  Pharmacy Consult for Heparin Indication: chest pain/ACS   Assessment: Gina Navarro is a 34 yoF who was admitted on 9/8 for NSTEMI. Heparin level is therapeutic today at 0.48. Hemoglobin and platelets are stable with no bleeding noted. Patient has a cath procedure planned for today.   Goal of Therapy:  Heparin level 0.3-0.7 units/ml Monitor platelets by anticoagulation protocol: Yes   Plan:  Continue heparin 1400 units/hr Monitor daily heparin level, CBC, and s/sx' of bleeding Follow-up post-cath anticoagulation plan  _______________________________________________________________________________________________________________________________________________  Allergies  Allergen Reactions  . Bee Venom   . Statins Other (See Comments)    MYALGIAS with Crestor, Zocor. Tolerating Lipitor (05/2013)  . Victoza [Liraglutide]     Dizziness, peripheral neuropathy, constipation, heart palpitations    Patient Measurements: Height: 5\' 4"  (162.6 cm) Weight: 164 lb 6.4 oz (74.6 kg) IBW/kg (Calculated) : 54.7 Heparin Dosing Weight: 71.2 kg  Vital Signs: Temp: 97.8 F (36.6 C) (09/11 0508) Temp Source: Oral (09/11 0508) BP: 114/58 (09/11 0508) Pulse Rate: 104 (09/11 0508)  Labs:  Recent Labs  02/06/2016 1104  02/08/2016 1420  01/20/16 0527  01/20/16 2338 01/21/16 0618 01/21/16 1059 01/28/2016 0605  HGB 8.5*  < >  --   --  7.4*  --   --  8.0*  --  8.2*  HCT 25.4*  < >  --   --  23.4*  --   --  25.6*  --  25.8*  PLT 295  --   --   --  287  --   --  356  --  379  APTT 26  --   --   --   --   --   --   --   --   --   LABPROT 14.2  --   --   --   --   --   --   --  13.4  --   INR 1.09  --   --   --   --   --   --   --  1.02  --   HEPARINUNFRC  --   --   --   < > 0.17*  < > 0.32 0.52  --  0.48  CREATININE 1.21*  < >  --   --  1.22*  --   --  1.09*  --  1.03*  TROPONINI  --   --  1.22*  --  1.55*  --   --   --   --   --   < > = values in this  interval not displayed.  Estimated Creatinine Clearance: 55.3 mL/min (by C-G formula based on SCr of 1.03 mg/dL).   Medications:  Infusions:  . sodium chloride    . heparin 1,400 Units/hr (01/21/16 0400)    Belia Heman, PharmD PGY1 Pharmacy Resident [phone] (Pager) 01/21/2016 7:45 AM

## 2016-01-22 NOTE — Progress Notes (Addendum)
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 Einar Gip 3-4 years ago. She would prefer not to see Dr. Einar Gip this hospitalization  -patient presented with typical symptoms with chest pain and dyspnea with activity and relieved by rest, for a week and with positive troponin  -  heparin gtt, asa, not a candidate for statin due to allergy, avoid initiating betablocker in the acute setting per cardiology recommendation, plan to cath on 9/11 ,  -cards plan to consult cardiothoracic surgery for possible CAGB, detail please see cath report on [id number]  Acute systolic CHF, worsening of chronic diastolic chf - clinically she is fluid overload, with pulmonary edema on chest x ray, lower extremity edema, DOE, now cough , worse at night - 2D echo pending - concern for ischemic cardiomyopathy vs doxorubicin induced as she just completed chemo 2 weeks ago -trial of iv lasixx1 on 9/9, on 9/10 lower extremity edema has resolved, reported less cough. Cr improving from 1.22 on 9/9 to 1.09 on 9/10.   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 repeat A1C 8.0  on 9/8 - hold home medications, place pt on SSI, carb modified  diet  HLD - ldl 112, triglyceride 178 -allergic to statins, consider zetia  H/o HTN -  normotensive in the ER, not on bp meds at home  State IIIA inavasive ductal carcinoma, ER+PR+ breast cancer ,under neoadjuvant chemotheray last chemo with doxorubicin/cytoxan on 8/25 with supportive GCSF injection  per Oncologist Dr Jana Hakim plan to finish 4cycles of Ac then, paclitazel x2, then surgery, then adjuvant XRT.  CTA chest this admission showed : Enlarged right peritracheal and right sub carinal lymph nodes are noted concerning for possible metastatic disease.  Right axillary adenopathy noted on prior exam is significantly smaller in size currently, consistent with improving metastatic disease.  I have discussed result with Dr Jana Hakim on Monday (9/11), Dr Jana Hakim is aware of the CTA result, and recommend patient to continue cancer treatment in the next few weeks with attempt to cure.   DVT prophylaxis: Heparin infusion  Code Status: Full  Family Communication: no family bedside, Disposition Plan: remain in patient   Consultants:  Cardiology  Oncology  Cardiothoracic surgery  Procedures:  Cardiac cath on 9/11  Antibiotics:  none   Objective: BP 133/64 (BP Location: Left Arm)   Pulse (!) 116   Temp 97.8 F (36.6 C) (Oral)   Resp (!) 30   Ht 5\' 4"  (1.626 m)   Wt 74.6 kg (164 lb 6.4 oz)   LMP 05/13/1996   SpO2 96%   BMI 28.22 kg/m   Intake/Output Summary (Last 24 hours) at 02/10/2016 1247 Last data filed at 01/26/2016 0600  Gross per 24  hour  Intake             1226 ml  Output              300 ml  Net              926 ml   Filed Weights   01/20/16 0500 01/21/16 0500 01/20/2016 0508  Weight: 75.7 kg (166 lb 14.4 oz) 74.5 kg (164 lb 4.8 oz) 74.6 kg (164 lb 6.4 oz)    Exam:   General:  NAD, allopecia  Cardiovascular: sinsus tachycardia  Respiratory: diminished at basis  Abdomen: Soft/ND/NT, positive BS  Musculoskeletal: edema observed on  admission, has resolved.  Neuro: aaox3  Data Reviewed: Basic Metabolic Panel:  Recent Labs Lab 01/31/2016 0819 02/03/2016 1104 01/21/2016 1114 01/20/16 0527 01/21/16 0618 02/08/2016 0605  NA 138 138 137 140 139 141  K 4.0 4.2 4.1 4.0 3.7 3.8  CL  --  106 105 106 107 108  CO2 16* 18*  --  20* 18* 23  GLUCOSE 239* 185* 178* 134* 131* 176*  BUN 20.8 23* 19 25* 26* 23*  CREATININE 1.2* 1.21* 1.10* 1.22* 1.09* 1.03*  CALCIUM 8.9 8.8*  --  8.5* 8.7* 8.9  MG  --   --   --   --  2.2  --    Liver Function Tests:  Recent Labs Lab 02/06/2016 0819 02/05/2016 1104  AST 16 20  ALT 24 26  ALKPHOS 83 82  BILITOT 0.84 1.1  PROT 6.6 6.9  ALBUMIN 3.2* 3.8    Recent Labs Lab 02/01/2016 1104  LIPASE 21   No results for input(s): AMMONIA in the last 168 hours. CBC:  Recent Labs Lab 02/07/2016 0819 01/31/2016 1104 01/18/2016 1114 01/20/16 0527 01/21/16 0618 01/18/2016 0605  WBC 15.7* 20.3*  --  11.3* 8.6 7.5  NEUTROABS 14.7*  --   --   --   --   --   HGB 8.0* 8.5* 8.8* 7.4* 8.0* 8.2*  HCT 24.7* 25.4* 26.0* 23.4* 25.6* 25.8*  MCV 91.3 90.7  --  92.5 94.8 93.1  PLT 275 295  --  287 356 379   Cardiac Enzymes:    Recent Labs Lab 01/15/2016 1420 01/20/16 0527  TROPONINI 1.22* 1.55*   BNP (last 3 results) No results for input(s): BNP in the last 8760 hours.  ProBNP (last 3 results) No results for input(s): PROBNP in the last 8760 hours.  CBG:  Recent Labs Lab 01/21/16 1144 01/21/16 1622 01/21/16 2152 02/07/2016 0734 01/22/16 1150  GLUCAP 153* 165* 277* 182* 153*    Recent Results (from the past 240 hour(s))  Urine culture     Status: Abnormal   Collection Time: 01/20/16  1:29 PM  Result Value Ref Range Status   Specimen Description URINE, RANDOM  Final   Special Requests NONE  Final   Culture MULTIPLE SPECIES PRESENT, SUGGEST RECOLLECTION (A)  Final   Report Status 01/21/2016 FINAL  Final     Studies: No results found.  Scheduled Meds: . aspirin EC  81 mg Oral Daily  .  insulin aspart  0-9 Units Subcutaneous TID WC  . sodium chloride flush  3 mL Intravenous Q12H    Continuous Infusions: . sodium chloride    . heparin       Time spent: 70mins  Charlot Gouin MD, PhD  [facility] Pager [phone]. If 7PM-7AM, please contact night-coverage at [email or web address], password [facility] 01/22/2016, 12:47 PM  LOS: 3 days

## 2016-01-22 NOTE — Interval H&P Note (Signed)
History and Physical Interval Note:  01/13/2016 10:21 AM  Jory Ee  has presented today for cardiac cath with the diagnosis of NSTEMI/unstable angina  The various methods of treatment have been discussed with the patient and family. After consideration of risks, benefits and other options for treatment, the patient has consented to  Procedure(s): Left Heart Cath and Coronary Angiography (N/A) as a surgical intervention .  The patient's history has been reviewed, patient examined, no change in status, stable for surgery.  I have reviewed the patient's chart and labs.  Questions were answered to the patient's satisfaction.  Cath Lab Visit (complete for each Cath Lab visit)  Clinical Evaluation Leading to the Procedure:   ACS: Yes.    Non-ACS:    Anginal Classification: CCS III  Anti-ischemic medical therapy: No Therapy  Non-Invasive Test Results: No non-invasive testing performed  Prior CABG: No previous CABG           Lauree Chandler

## 2016-01-22 NOTE — Progress Notes (Signed)
SUBJECTIVE:  No further CP  OBJECTIVE:   Vitals:   Vitals:   01/21/16 1419 01/21/16 1420 01/21/16 2145 02/07/2016 0508  BP:  137/77 117/69 (!) 114/58  Pulse: 100  (!) 116 (!) 104  Resp:      Temp: 98.3 F (36.8 C)  98 F (36.7 C) 97.8 F (36.6 C)  TempSrc: Oral  Oral Oral  SpO2: 100%  100% 100%  Weight:    164 lb 6.4 oz (74.6 kg)  Height:       I&O's:   Intake/Output Summary (Last 24 hours) at 02/02/2016 0959 Last data filed at 01/14/2016 0600  Gross per 24 hour  Intake           1265.2 ml  Output              300 ml  Net            965.2 ml   TELEMETRY: Reviewed telemetry pt in sinus tachycardia:     PHYSICAL EXAM General: Well developed, well nourished, in no acute distress Head: Eyes PERRLA, No xanthomas.   Normal cephalic and atramatic  Lungs:   Clear bilaterally to auscultation and percussion. Heart:   tachycardic S1 S2 Pulses are 2+ & equal. Abdomen: Bowel sounds are positive, abdomen soft and non-tender without masses  Msk:  Back normal, normal gait. Normal strength and tone for age. Extremities:   No clubbing, cyanosis or edema.  DP +1 Neuro: Alert and oriented X 3. Psych:  Good affect, responds appropriately   LABS: Basic Metabolic Panel:  Recent Labs  01/21/16 0618 02/06/2016 0605  NA 139 141  K 3.7 3.8  CL 107 108  CO2 18* 23  GLUCOSE 131* 176*  BUN 26* 23*  CREATININE 1.09* 1.03*  CALCIUM 8.7* 8.9  MG 2.2  --    Liver Function Tests:  Recent Labs  01/21/2016 1104  AST 20  ALT 26  ALKPHOS 82  BILITOT 1.1  PROT 6.9  ALBUMIN 3.8    Recent Labs  01/16/2016 1104  LIPASE 21   CBC:  Recent Labs  01/21/16 0618 02/06/2016 0605  WBC 8.6 7.5  HGB 8.0* 8.2*  HCT 25.6* 25.8*  MCV 94.8 93.1  PLT 356 379   Cardiac Enzymes:  Recent Labs  01/31/2016 1420 01/20/16 0527  TROPONINI 1.22* 1.55*   BNP: Invalid input(s): POCBNP D-Dimer: No results for input(s): DDIMER in the last 72 hours. Hemoglobin A1C:  Recent Labs  01/24/2016 1420    HGBA1C 8.0*   Fasting Lipid Panel:  Recent Labs  01/20/16 0527  CHOL 175  HDL 27*  LDLCALC 112*  TRIG 178*  CHOLHDL 6.5   Thyroid Function Tests:  Recent Labs  01/25/2016 1955  TSH 1.851   Anemia Panel: No results for input(s): VITAMINB12, FOLATE, FERRITIN, TIBC, IRON, RETICCTPCT in the last 72 hours. Coag Panel:   Lab Results  Component Value Date   INR 1.02 01/21/2016   INR 1.09 02/10/2016   INR 1.01 11/22/2015    RADIOLOGY: Dg Chest 2 View  Result Date: 02/04/2016 CLINICAL DATA:  Shortness of breath and cough for 5 days for history of breast carcinoma. EXAM: CHEST  2 VIEW COMPARISON:  Chest CT November 17, 2015 FINDINGS: There is interstitial edema, primarily in the mid and lower lung zones bilaterally. There is a minimal pleural effusion on each side. There is mild cardiomegaly with pulmonary venous hypertension. Port-A-Cath tip is in the superior vena cava. No adenopathy. No bone lesions evident.  There is calcification in the right carotid artery. IMPRESSION: Findings most consistent with a degree of congestive heart failure. It should be noted that lymphangitic spread of tumor could present in this manner and must be considered a differential consideration given history of breast carcinoma. Both entities could exist concurrently. There is no frank airspace consolidation. Port-A-Cath tip in superior vena cava. Right carotid artery calcification noted. Electronically Signed   By: Lowella Grip III M.D.   On: 01/31/2016 10:08   Ct Angio Chest Pe W And/or Wo Contrast  Result Date: 01/16/2016 CLINICAL DATA:  Chest pain, shortness of breath. History of breast cancer. EXAM: CT ANGIOGRAPHY CHEST WITH CONTRAST TECHNIQUE: Multidetector CT imaging of the chest was performed using the standard protocol during bolus administration of intravenous contrast. Multiplanar CT image reconstructions and MIPs were obtained to evaluate the vascular anatomy. CONTRAST:  52 mL of Isovue 370  intravenously. COMPARISON:  CT scan of November 17, 2015. FINDINGS: No pneumothorax is noted. Mild bilateral pleural effusions are noted, with right greater than left. Bibasilar interstitial densities are noted most consistent with pulmonary edema. There is no definite evidence of pulmonary embolus. Atherosclerosis of thoracic aorta is noted without aneurysm or dissection. Coronary artery calcifications are noted. 8 mm right peritracheal lymph node is noted which is significantly increased compared to prior exam. Also noted is 18 mm right sub carinal lymph node concerning for possible metastatic disease. 15 mm right axillary lymph node is noted which is decreased in size compared to prior exam but concerning for metastatic disease. There is been interval placement of right internal jugular Port-A-Cath with distal tip in expected position of cavoatrial junction. No definite abnormality is seen within the visualized portion of the upper abdomen. Multilevel degenerative disc disease is noted in the lower thoracic spine. Review of the MIP images confirms the above findings. IMPRESSION: No definite evidence of pulmonary embolus. Aortic atherosclerosis. Mild bilateral pleural effusions are noted, with right greater than left. Bibasilar interstitial densities are noted most consistent with pulmonary edema. Coronary artery calcifications are noted suggesting coronary disease. Enlarged right peritracheal and right sub carinal lymph nodes are noted concerning for possible metastatic disease. Right axillary adenopathy noted on prior exam is significantly smaller in size currently, consistent with improving metastatic disease. Electronically Signed   By: Marijo Conception, M.D.   On: 01/14/2016 12:39   ASSESSMENT AND PLAN: 64F with DM, CKD stage III (pt unaware - baseline Cr 1.2-1.4), HTN (lisinopril stopped summer 2017 due to faintness), arthritis, and recently diagnosed metastatic breast CA to axillary lymph nodes ( tx with  cyclophosphamide/doxorubicin this summer), anemia felt related to chemo who presented to [facility] upon transfer from [facility] for concern for NSTEMI and acute CHF. Screening Echo prior to chemo 11/2015 with normal EF, down to 40-45%. Troponin up to 1.22 on admission, peaked at 1.55. CTA without PE but + suspicion of CHF, coronary calcifications, and enlarged right peritracheal and right sub carinal lymph nodes are noted concerning for possible metastatic disease.  1. NSTEMI with suspected acute CHF - etiology could be ischemic in nature versus chemotherapy induced. Continueaspirin andheparin. Hold off BB given acute decompensation. BP had been slightly low to add ACEI just yet. Lipids show LDL 112.Chest pain free at present time. Echo showed EF has declined to 40-45%.  Cath Today.  Continue IV Heparin gtt/ASA.  She is mildly tachycardic.  Does not appear volume overloaded on exam.  Will consider starting low dose BB if BP stable post cath.  2. CKD III - will need to follow given contrast with CT. SCr 1.09 today.  3. Breast CA - may need oncology input regarding peritracheal/subcarinal findings on CT - will defer to primary team to review.  4. HTN - taken off ACEI due to faintness/BP dropping. Follow.  5. Anemia: Hgb 8.8-->7.4.-->8-->8.2 Follow.     Fransico Him, MD  01/19/2016  9:59 AM

## 2016-01-22 NOTE — H&P (View-Only) (Signed)
SUBJECTIVE:  No further CP  OBJECTIVE:   Vitals:   Vitals:   01/21/16 1419 01/21/16 1420 01/21/16 2145 02/02/2016 0508  BP:  137/77 117/69 (!) 114/58  Pulse: 100  (!) 116 (!) 104  Resp:      Temp: 98.3 F (36.8 C)  98 F (36.7 C) 97.8 F (36.6 C)  TempSrc: Oral  Oral Oral  SpO2: 100%  100% 100%  Weight:    164 lb 6.4 oz (74.6 kg)  Height:       I&O's:   Intake/Output Summary (Last 24 hours) at 02/02/2016 0959 Last data filed at 01/12/2016 0600  Gross per 24 hour  Intake           1265.2 ml  Output              300 ml  Net            965.2 ml   TELEMETRY: Reviewed telemetry pt in sinus tachycardia:     PHYSICAL EXAM General: Well developed, well nourished, in no acute distress Head: Eyes PERRLA, No xanthomas.   Normal cephalic and atramatic  Lungs:   Clear bilaterally to auscultation and percussion. Heart:   tachycardic S1 S2 Pulses are 2+ & equal. Abdomen: Bowel sounds are positive, abdomen soft and non-tender without masses  Msk:  Back normal, normal gait. Normal strength and tone for age. Extremities:   No clubbing, cyanosis or edema.  DP +1 Neuro: Alert and oriented X 3. Psych:  Good affect, responds appropriately   LABS: Basic Metabolic Panel:  Recent Labs  01/21/16 0618 01/30/2016 0605  NA 139 141  K 3.7 3.8  CL 107 108  CO2 18* 23  GLUCOSE 131* 176*  BUN 26* 23*  CREATININE 1.09* 1.03*  CALCIUM 8.7* 8.9  MG 2.2  --    Liver Function Tests:  Recent Labs  02/03/2016 1104  AST 20  ALT 26  ALKPHOS 82  BILITOT 1.1  PROT 6.9  ALBUMIN 3.8    Recent Labs  01/20/2016 1104  LIPASE 21   CBC:  Recent Labs  01/21/16 0618 01/22/16 0605  WBC 8.6 7.5  HGB 8.0* 8.2*  HCT 25.6* 25.8*  MCV 94.8 93.1  PLT 356 379   Cardiac Enzymes:  Recent Labs  01/13/2016 1420 01/20/16 0527  TROPONINI 1.22* 1.55*   BNP: Invalid input(s): POCBNP D-Dimer: No results for input(s): DDIMER in the last 72 hours. Hemoglobin A1C:  Recent Labs  01/29/2016 1420    HGBA1C 8.0*   Fasting Lipid Panel:  Recent Labs  01/20/16 0527  CHOL 175  HDL 27*  LDLCALC 112*  TRIG 178*  CHOLHDL 6.5   Thyroid Function Tests:  Recent Labs  02/05/2016 1955  TSH 1.851   Anemia Panel: No results for input(s): VITAMINB12, FOLATE, FERRITIN, TIBC, IRON, RETICCTPCT in the last 72 hours. Coag Panel:   Lab Results  Component Value Date   INR 1.02 01/21/2016   INR 1.09 01/15/2016   INR 1.01 11/22/2015    RADIOLOGY: Dg Chest 2 View  Result Date: 01/28/2016 CLINICAL DATA:  Shortness of breath and cough for 5 days for history of breast carcinoma. EXAM: CHEST  2 VIEW COMPARISON:  Chest CT November 17, 2015 FINDINGS: There is interstitial edema, primarily in the mid and lower lung zones bilaterally. There is a minimal pleural effusion on each side. There is mild cardiomegaly with pulmonary venous hypertension. Port-A-Cath tip is in the superior vena cava. No adenopathy. No bone lesions evident.  There is calcification in the right carotid artery. IMPRESSION: Findings most consistent with a degree of congestive heart failure. It should be noted that lymphangitic spread of tumor could present in this manner and must be considered a differential consideration given history of breast carcinoma. Both entities could exist concurrently. There is no frank airspace consolidation. Port-A-Cath tip in superior vena cava. Right carotid artery calcification noted. Electronically Signed   By: Lowella Grip III M.D.   On: 01/24/2016 10:08   Ct Angio Chest Pe W And/or Wo Contrast  Result Date: 02/03/2016 CLINICAL DATA:  Chest pain, shortness of breath. History of breast cancer. EXAM: CT ANGIOGRAPHY CHEST WITH CONTRAST TECHNIQUE: Multidetector CT imaging of the chest was performed using the standard protocol during bolus administration of intravenous contrast. Multiplanar CT image reconstructions and MIPs were obtained to evaluate the vascular anatomy. CONTRAST:  52 mL of Isovue 370  intravenously. COMPARISON:  CT scan of November 17, 2015. FINDINGS: No pneumothorax is noted. Mild bilateral pleural effusions are noted, with right greater than left. Bibasilar interstitial densities are noted most consistent with pulmonary edema. There is no definite evidence of pulmonary embolus. Atherosclerosis of thoracic aorta is noted without aneurysm or dissection. Coronary artery calcifications are noted. 8 mm right peritracheal lymph node is noted which is significantly increased compared to prior exam. Also noted is 18 mm right sub carinal lymph node concerning for possible metastatic disease. 15 mm right axillary lymph node is noted which is decreased in size compared to prior exam but concerning for metastatic disease. There is been interval placement of right internal jugular Port-A-Cath with distal tip in expected position of cavoatrial junction. No definite abnormality is seen within the visualized portion of the upper abdomen. Multilevel degenerative disc disease is noted in the lower thoracic spine. Review of the MIP images confirms the above findings. IMPRESSION: No definite evidence of pulmonary embolus. Aortic atherosclerosis. Mild bilateral pleural effusions are noted, with right greater than left. Bibasilar interstitial densities are noted most consistent with pulmonary edema. Coronary artery calcifications are noted suggesting coronary disease. Enlarged right peritracheal and right sub carinal lymph nodes are noted concerning for possible metastatic disease. Right axillary adenopathy noted on prior exam is significantly smaller in size currently, consistent with improving metastatic disease. Electronically Signed   By: Marijo Conception, M.D.   On: 01/16/2016 12:39   ASSESSMENT AND PLAN: 80F with DM, CKD stage III (pt unaware - baseline Cr 1.2-1.4), HTN (lisinopril stopped summer 2017 due to faintness), arthritis, and recently diagnosed metastatic breast CA to axillary lymph nodes ( tx with  cyclophosphamide/doxorubicin this summer), anemia felt related to chemo who presented to [facility] upon transfer from [facility] for concern for NSTEMI and acute CHF. Screening Echo prior to chemo 11/2015 with normal EF, down to 40-45%. Troponin up to 1.22 on admission, peaked at 1.55. CTA without PE but + suspicion of CHF, coronary calcifications, and enlarged right peritracheal and right sub carinal lymph nodes are noted concerning for possible metastatic disease.  1. NSTEMI with suspected acute CHF - etiology could be ischemic in nature versus chemotherapy induced. Continueaspirin andheparin. Hold off BB given acute decompensation. BP had been slightly low to add ACEI just yet. Lipids show LDL 112.Chest pain free at present time. Echo showed EF has declined to 40-45%.  Cath Today.  Continue IV Heparin gtt/ASA.  She is mildly tachycardic.  Does not appear volume overloaded on exam.  Will consider starting low dose BB if BP stable post cath.  2. CKD III - will need to follow given contrast with CT. SCr 1.09 today.  3. Breast CA - may need oncology input regarding peritracheal/subcarinal findings on CT - will defer to primary team to review.  4. HTN - taken off ACEI due to faintness/BP dropping. Follow.  5. Anemia: Hgb 8.8-->7.4.-->8-->8.2 Follow.     Fransico Him, MD  01/14/2016  9:59 AM

## 2016-01-23 ENCOUNTER — Inpatient Hospital Stay (HOSPITAL_COMMUNITY): Payer: BLUE CROSS/BLUE SHIELD

## 2016-01-23 ENCOUNTER — Inpatient Hospital Stay (HOSPITAL_COMMUNITY): Payer: BLUE CROSS/BLUE SHIELD | Admitting: Anesthesiology

## 2016-01-23 ENCOUNTER — Other Ambulatory Visit: Payer: Self-pay

## 2016-01-23 ENCOUNTER — Encounter (HOSPITAL_COMMUNITY): Payer: Self-pay | Admitting: Anesthesiology

## 2016-01-23 ENCOUNTER — Encounter (HOSPITAL_COMMUNITY): Payer: Self-pay | Admitting: Cardiovascular Disease

## 2016-01-23 ENCOUNTER — Encounter (HOSPITAL_COMMUNITY): Admission: EM | Disposition: E | Payer: Self-pay | Source: Home / Self Care | Attending: Internal Medicine

## 2016-01-23 DIAGNOSIS — I25700 Atherosclerosis of coronary artery bypass graft(s), unspecified, with unstable angina pectoris: Secondary | ICD-10-CM

## 2016-01-23 DIAGNOSIS — J810 Acute pulmonary edema: Secondary | ICD-10-CM

## 2016-01-23 DIAGNOSIS — I469 Cardiac arrest, cause unspecified: Secondary | ICD-10-CM

## 2016-01-23 DIAGNOSIS — J9601 Acute respiratory failure with hypoxia: Secondary | ICD-10-CM

## 2016-01-23 HISTORY — PX: CARDIAC CATHETERIZATION: SHX172

## 2016-01-23 LAB — GLUCOSE, CAPILLARY
Glucose-Capillary: 198 mg/dL — ABNORMAL HIGH (ref 65–99)
Glucose-Capillary: 196 mg/dL — ABNORMAL HIGH (ref 65–99)

## 2016-01-23 LAB — BASIC METABOLIC PANEL
Anion gap: 11 (ref 5–15)
BUN: 16 mg/dL (ref 6–20)
CALCIUM: 9.2 mg/dL (ref 8.9–10.3)
CO2: 21 mmol/L — AB (ref 22–32)
Chloride: 107 mmol/L (ref 101–111)
Creatinine, Ser: 1.04 mg/dL — ABNORMAL HIGH (ref 0.44–1.00)
GFR calc Af Amer: >60 mL/min (ref >60)
GFR, EST NON AFRICAN AMERICAN: 56 mL/min — AB (ref >60)
GLUCOSE: 208 mg/dL — AB (ref 65–99)
POTASSIUM: 3.6 mmol/L (ref 3.5–5.1)
Sodium: 139 mmol/L (ref 135–145)

## 2016-01-23 LAB — CBC
HEMATOCRIT: 29.3 % — AB (ref 36.0–46.0)
Hemoglobin: 9 g/dL — ABNORMAL LOW (ref 12.0–15.0)
MCH: 29 pg (ref 26.0–34.0)
MCHC: 30.7 g/dL (ref 30.0–36.0)
MCV: 94.5 fL (ref 78.0–100.0)
Platelets: 424 10*3/uL — ABNORMAL HIGH (ref 150–400)
RBC: 3.1 MIL/uL — ABNORMAL LOW (ref 3.87–5.11)
RDW: 17.9 % — AB (ref 11.5–15.5)
WBC: 10.7 10*3/uL — ABNORMAL HIGH (ref 4.0–10.5)

## 2016-01-23 LAB — HEPARIN LEVEL (UNFRACTIONATED): HEPARIN UNFRACTIONATED: 0.45 [IU]/mL (ref 0.30–0.70)

## 2016-01-23 LAB — POCT ACTIVATED CLOTTING TIME
ACTIVATED CLOTTING TIME: 164 s
ACTIVATED CLOTTING TIME: 318 s
Activated Clotting Time: 257 seconds
Activated Clotting Time: 511 seconds

## 2016-01-23 SURGERY — CORONARY STENT INTERVENTION

## 2016-01-23 MED ORDER — NITROGLYCERIN IN D5W 200-5 MCG/ML-% IV SOLN
INTRAVENOUS | Status: AC
  Filled 2016-01-23: qty 250

## 2016-01-23 MED ORDER — FUROSEMIDE 10 MG/ML IJ SOLN
INTRAMUSCULAR | Status: AC
  Filled 2016-01-23: qty 4

## 2016-01-23 MED ORDER — FUROSEMIDE 10 MG/ML IJ SOLN
40.0000 mg | Freq: Once | INTRAMUSCULAR | Status: AC
  Administered 2016-01-23: 40 mg via INTRAVENOUS

## 2016-01-23 MED ORDER — NITROGLYCERIN IN D5W 200-5 MCG/ML-% IV SOLN
INTRAVENOUS | Status: DC | PRN
  Administered 2016-01-23: 20 ug/min via INTRAVENOUS

## 2016-01-23 MED ORDER — MIDAZOLAM HCL 2 MG/2ML IJ SOLN
INTRAMUSCULAR | Status: AC
Start: 2016-01-23 — End: 2016-01-23
  Filled 2016-01-23: qty 2

## 2016-01-23 MED ORDER — SODIUM CHLORIDE 0.9 % IV SOLN
1.0000 mg/h | INTRAVENOUS | Status: DC
  Filled 2016-01-23: qty 10

## 2016-01-23 MED ORDER — IOPAMIDOL (ISOVUE-370) INJECTION 76%
INTRAVENOUS | Status: AC
  Filled 2016-01-23: qty 125

## 2016-01-23 MED ORDER — SODIUM CHLORIDE 0.9% FLUSH: 10.0000 mL | Freq: Two times a day (BID) | INTRAVENOUS | Status: DC

## 2016-01-23 MED ORDER — HEPARIN (PORCINE) IN NACL 2-0.9 UNIT/ML-% IJ SOLN
INTRAMUSCULAR | Status: AC
  Filled 2016-01-23: qty 1000

## 2016-01-23 MED ORDER — MIDAZOLAM HCL 2 MG/2ML IJ SOLN
INTRAMUSCULAR | Status: DC | PRN
  Administered 2016-01-23: 1 mg via INTRAVENOUS
  Administered 2016-01-23 (×2): 2 mg via INTRAVENOUS

## 2016-01-23 MED ORDER — EPINEPHRINE HCL 0.1 MG/ML IJ SOSY
PREFILLED_SYRINGE | INTRAMUSCULAR | Status: AC
  Filled 2016-01-23: qty 30

## 2016-01-23 MED ORDER — VERAPAMIL HCL 2.5 MG/ML IV SOLN
INTRAVENOUS | Status: AC
  Filled 2016-01-23: qty 2

## 2016-01-23 MED ORDER — LIDOCAINE HCL (PF) 1 % IJ SOLN
INTRAMUSCULAR | Status: AC
  Filled 2016-01-23: qty 30

## 2016-01-23 MED ORDER — LIDOCAINE HCL (PF) 1 % IJ SOLN
INTRAMUSCULAR | Status: DC | PRN
  Administered 2016-01-23: 2 mL via INTRADERMAL

## 2016-01-23 MED ORDER — SODIUM CHLORIDE 0.9% FLUSH: 3.0000 mL | INTRAVENOUS | Status: DC | PRN

## 2016-01-23 MED ORDER — PROPOFOL 10 MG/ML IV BOLUS
INTRAVENOUS | Status: DC | PRN
  Administered 2016-01-23: 100 mg via INTRAVENOUS

## 2016-01-23 MED ORDER — IOPAMIDOL (ISOVUE-370) INJECTION 76%
INTRAVENOUS | Status: DC | PRN
  Administered 2016-01-23: 105 mL via INTRA_ARTERIAL

## 2016-01-23 MED ORDER — FUROSEMIDE 10 MG/ML IJ SOLN
INTRAMUSCULAR | Status: DC | PRN
  Administered 2016-01-23: 40 mg via INTRAVENOUS

## 2016-01-23 MED ORDER — SODIUM BICARBONATE 8.4 % IV SOLN
INTRAVENOUS | Status: AC
  Filled 2016-01-23: qty 100

## 2016-01-23 MED ORDER — SUCCINYLCHOLINE CHLORIDE 20 MG/ML IJ SOLN
INTRAMUSCULAR | Status: DC | PRN
  Administered 2016-01-23: 120 mg via INTRAVENOUS

## 2016-01-23 MED ORDER — HEPARIN SODIUM (PORCINE) 1000 UNIT/ML IJ SOLN
INTRAMUSCULAR | Status: AC
  Filled 2016-01-23: qty 1

## 2016-01-23 MED ORDER — ASPIRIN 81 MG PO CHEW
81.0000 mg | CHEWABLE_TABLET | ORAL | Status: AC
  Administered 2016-01-23: 81 mg via ORAL
  Filled 2016-01-23: qty 1

## 2016-01-23 MED ORDER — METOPROLOL TARTRATE 5 MG/5ML IV SOLN
INTRAVENOUS | Status: DC | PRN
  Administered 2016-01-23: 5 mg via INTRAVENOUS

## 2016-01-23 MED ORDER — SODIUM CHLORIDE 0.9 % IV SOLN: 250.0000 mL | INTRAVENOUS | Status: DC | PRN

## 2016-01-23 MED ORDER — HEPARIN (PORCINE) IN NACL 2-0.9 UNIT/ML-% IJ SOLN
INTRAMUSCULAR | Status: DC | PRN
  Administered 2016-01-23: 1500 mL

## 2016-01-23 MED ORDER — FUROSEMIDE 40 MG PO TABS: 40.0000 mg | ORAL_TABLET | Freq: Every day | ORAL | Status: DC

## 2016-01-23 MED ORDER — FENTANYL CITRATE (PF) 100 MCG/2ML IJ SOLN
INTRAMUSCULAR | Status: AC
  Filled 2016-01-23: qty 2

## 2016-01-23 MED ORDER — SODIUM CHLORIDE 0.9% FLUSH: 10.0000 mL | INTRAVENOUS | Status: DC | PRN

## 2016-01-23 MED ORDER — FENTANYL CITRATE (PF) 100 MCG/2ML IJ SOLN
INTRAMUSCULAR | Status: DC | PRN
  Administered 2016-01-23 (×2): 25 ug via INTRAVENOUS
  Administered 2016-01-23: 100 ug via INTRAVENOUS
  Administered 2016-01-23: 50 ug via INTRAVENOUS

## 2016-01-23 MED ORDER — SODIUM CHLORIDE 0.9 % IV SOLN: INTRAVENOUS | Status: DC

## 2016-01-23 MED ORDER — SODIUM CHLORIDE 0.9% FLUSH: 3.0000 mL | Freq: Two times a day (BID) | INTRAVENOUS | Status: DC

## 2016-01-23 MED ORDER — MIDAZOLAM HCL 2 MG/2ML IJ SOLN
INTRAMUSCULAR | Status: AC
  Filled 2016-01-23: qty 2

## 2016-01-23 MED ORDER — METOPROLOL TARTRATE 5 MG/5ML IV SOLN
INTRAVENOUS | Status: AC
  Filled 2016-01-23: qty 5

## 2016-01-23 MED ORDER — VERAPAMIL HCL 2.5 MG/ML IV SOLN
INTRAVENOUS | Status: DC | PRN
  Administered 2016-01-23: 10 mL via INTRA_ARTERIAL

## 2016-01-23 MED ORDER — SODIUM CHLORIDE 0.9 % IV SOLN
10.0000 ug/h | INTRAVENOUS | Status: DC
  Filled 2016-01-23: qty 50

## 2016-01-23 MED ORDER — TICAGRELOR 90 MG PO TABS
180.0000 mg | ORAL_TABLET | Freq: Once | ORAL | Status: AC
  Administered 2016-01-23: 180 mg via ORAL
  Filled 2016-01-23: qty 2

## 2016-01-23 MED ORDER — NITROGLYCERIN 1 MG/10 ML FOR IR/CATH LAB
INTRA_ARTERIAL | Status: AC
  Filled 2016-01-23: qty 10

## 2016-01-23 MED ORDER — CARVEDILOL 3.125 MG PO TABS
3.1250 mg | ORAL_TABLET | Freq: Two times a day (BID) | ORAL | Status: DC
  Administered 2016-01-23: 3.125 mg via ORAL
  Filled 2016-01-23: qty 1

## 2016-01-23 MED ORDER — HEPARIN SODIUM (PORCINE) 1000 UNIT/ML IJ SOLN
INTRAMUSCULAR | Status: DC | PRN
  Administered 2016-01-23: 4000 [IU] via INTRAVENOUS
  Administered 2016-01-23: 5000 [IU] via INTRAVENOUS
  Administered 2016-01-23: 8000 [IU] via INTRAVENOUS

## 2016-01-23 MED FILL — Lidocaine HCl Local Preservative Free (PF) Inj 1%: INTRAMUSCULAR | Qty: 30 | Status: AC

## 2016-01-23 SURGICAL SUPPLY — 18 items
BALLN EMERGE MR 2.0X12 (BALLOONS) ×3
BALLN EMERGE MR 2.5X12 (BALLOONS) ×3
BALLOON EMERGE MR 2.0X12 (BALLOONS) IMPLANT
BALLOON EMERGE MR 2.5X12 (BALLOONS) IMPLANT
CATH VISTA GUIDE 6FR JL3 (CATHETERS) ×2 IMPLANT
CATH VISTA GUIDE 6FR XBLAD3.5 (CATHETERS) ×2 IMPLANT
DEVICE RAD COMP TR BAND LRG (VASCULAR PRODUCTS) ×2 IMPLANT
GLIDESHEATH SLEND SS 6F .021 (SHEATH) ×2 IMPLANT
GUIDELINER 6F (CATHETERS) ×2 IMPLANT
KIT ENCORE 26 ADVANTAGE (KITS) ×3 IMPLANT
KIT HEART LEFT (KITS) ×3 IMPLANT
PACK CARDIAC CATHETERIZATION (CUSTOM PROCEDURE TRAY) ×3 IMPLANT
STENT SYNERGY DES 2.25X12 (Permanent Stent) ×2 IMPLANT
TRANSDUCER W/STOPCOCK (MISCELLANEOUS) ×3 IMPLANT
TUBING CIL FLEX 10 FLL-RA (TUBING) ×3 IMPLANT
WIRE COUGAR XT STRL 190CM (WIRE) ×2 IMPLANT
WIRE EMERALD 3MM-J .035X260CM (WIRE) ×2 IMPLANT
WIRE HI TORQ VERSACORE-J 145CM (WIRE) ×2 IMPLANT

## 2016-01-24 ENCOUNTER — Telehealth: Payer: Self-pay

## 2016-01-24 MED FILL — Nitroglycerin IV Soln 100 MCG/ML in D5W: INTRA_ARTERIAL | Qty: 10 | Status: AC

## 2016-01-25 ENCOUNTER — Telehealth: Payer: Self-pay

## 2016-01-25 ENCOUNTER — Other Ambulatory Visit: Payer: Self-pay | Admitting: Oncology

## 2016-01-25 ENCOUNTER — Telehealth: Payer: Self-pay | Admitting: *Deleted

## 2016-01-25 MED FILL — Medication: Qty: 1 | Status: AC

## 2016-01-25 NOTE — Progress Notes (Signed)
Call I called Don's brother Rush Landmark [phone]) who is her power of attorney and executor. He had many questions which I did my best to answer. I left my name and my work number so we can reach me if I can be of further help

## 2016-01-25 NOTE — Telephone Encounter (Signed)
Calling in regards to recently faxed paperwork. States that it is time sensitive and is needed by the coroner to continue with funeral services. Please advise.  Call back is [phone] Spoke with Magda Paganini

## 2016-01-25 NOTE — Telephone Encounter (Signed)
This RN was requested to return call to pt's brother- Dioselyn Kretsch per his call yesterday.  Number not given to return call per brother left message yesterday.  This RN could not locate documentation of call from 9/13.  Attempts made at numbers per demographic page and obtained VM for Janalyn Shy ( sister) to return call to this RN.

## 2016-01-26 NOTE — Telephone Encounter (Signed)
The form has been filled out and faxed

## 2016-01-26 NOTE — Telephone Encounter (Signed)
Already completed the form (this morning, as it was not in my box when I left yesterday). The completed form was placed in the "to fax" box in the provider lounge, and picked up by [place].

## 2016-01-26 NOTE — Telephone Encounter (Signed)
The company called again. I advised to re fax the form. The form has been placed in Gina Navarro's box. They would like for it to be completed and faxed back as soon as possible.

## 2016-01-26 NOTE — Telephone Encounter (Signed)
Spoke with company to tell them that the forms have been faxed

## 2016-02-11 NOTE — Progress Notes (Signed)
Went to check on pt and pt complained of some shortness of breath. Pt O2 sat on exam was 97% and lung sounds were diminished. Paged on call NP for [facility] and received orders for 40 mg of Lasix. Will continue to monitor.  [name] E

## 2016-02-11 NOTE — Progress Notes (Addendum)
Pt arrived in [place] and had PEA arrest. Code initiated by [facility] team. (see note by PCCM). CPR was started and she was resuscitated with medications to sinus but had VF and after multiple shocks and multiple rounds of medications, she converted to asystole and the code was called.   As noted previously, she was admitted with chest pain and dyspnea and was found to have acute CHF, NSTEMI with  LVEF of 40-45% with anterior wall motion abnormality on echo. Her LVEF had been normal by echo in July 2017. She underwent cardiac cath on 02/09/2016 which showed moderate left main stenosis, likely ruptured plaque in the ostial LAD with calcification, severe diffuse mid and distal LAD disease, severe proximal and mid RCA stenosis, moderate ostial Circumflex stenosis. She was seen by Dr. Servando Snare to discuss CABG but refused to consider CABG at this time stating that she wanted to continue with treatment of her breast cancer with chemotherapy. She and I had a long discussion this am about the severity and complexity of her CAD including the disease in the left main and ostial LAD. I did not feel that PCI was the best approach to treatment of her CAD but given the circumstances of her cancer treatment, she wished to pursue this strategy. Her PCI was relatively uneventful today. While there was difficulty delivering the stent into her LAD due to calcification, she remained hemodynamically stable during the entire procedure. The LAD stent was deployed in the ostium of the vessel and there was brisk flow into the distal vessel with no evidence of dissection or wire perforation. She became dyspneic at the conclusion of the PCI procedure. IV Lasix given for flash pulmonary edema. IV NTG started. Respiratory status worsened and she was intubated in the cath lab. Unknown cause of PEA arrest once arriving in [facility]. Handheld echo at the bedside by me with no evidence of pericardial effusion.   I spoke to her brother and sister in law. Her  sister in law Jossalynn is her POA. Ms. Maffucci has no children, no spouse. Her parents are deceased. Her only family is her brother and sister in law. I have given them all details over her death over the phone as they may not be able to come tonight from [place].     Lauree Chandler 02-16-2016  3:31 PM

## 2016-02-11 NOTE — Progress Notes (Signed)
Called to assess patient post intubation in the cath lab with pulmonary edema.  Patient was examined, diffuse crackles noted and patient was transferred to the ICU.  Once seen in the ICU patient became hypotensive and arrested.  Coded for >40 minutes with ROSC once but then arrested again.  Given failure of ROSC patient was declared dead and POA informed by cardiology.  PCCM will sign off.  Discussed with cardiology.  The patient is critically ill with multiple organ systems failure and requires high complexity decision making for assessment and support, frequent evaluation and titration of therapies, application of advanced monitoring technologies and extensive interpretation of multiple databases.   Critical Care Time devoted to patient care services described in this note is  35  Minutes. This time reflects time of care of this signee Dr Jennet Maduro. This critical care time does not reflect procedure time, or teaching time or supervisory time of PA/NP/Med student/Med Resident etc but could involve care discussion time.  Rush Farmer, M.D. [facility] Pulmonary/Critical Care Medicine. Pager: [phone]. After hours pager: [phone].

## 2016-02-11 NOTE — Procedures (Signed)
CPR Code Note  Please see code sheet for duration and proceedings.  PEA cardiac arrest with ROSC to sinus but then VT and shocked multiple times then asystole and no further ROSC.  Patient was declared dead and POA notified by cardiology.  Rush Farmer, M.D. [facility] Pulmonary/Critical Care Medicine. Pager: [phone]. After hours pager: [phone].

## 2016-02-11 NOTE — Progress Notes (Signed)
Interventional Cardiology Note:   Pt intubated in the cath lab at the conclusion of the PCI on the proximal LAD. A DES was placed in the ostial/proximal LAD. Pulmonary edema during the procedure. Pt intubated on the cath table by anesthesia and placed on mechanical ventilator. PCCM consulted to assist with vent management. Will continue diuresis with IV Lasix.  Staged PCI of the RCA later this week.   Lauree Chandler February 17, 2016 2:30 PM

## 2016-02-11 NOTE — Anesthesia Procedure Notes (Signed)
Procedure Name: Intubation Date/Time: 01/29/16 2:05 PM Performed by: Suzy Bouchard Pre-anesthesia Checklist: Patient identified, Emergency Drugs available, Suction available, Patient being monitored and Timeout performed Patient Re-evaluated:Patient Re-evaluated prior to inductionOxygen Delivery Method: Circle system utilized Preoxygenation: Pre-oxygenation with 100% oxygen Intubation Type: IV induction and Rapid sequence Laryngoscope Size: Miller and 2 Grade View: Grade I Tube type: Subglottic suction tube Tube size: 7.5 mm Number of attempts: 1 Airway Equipment and Method: Stylet Placement Confirmation: ETT inserted through vocal cords under direct vision,  CO2 detector and breath sounds checked- equal and bilateral Secured at: 20 cm Tube secured with: Tape Dental Injury: Teeth and Oropharynx as per pre-operative assessment  Comments: Called to cath lab for urgent intubation.  Brief report received/ explained procedure to patient.

## 2016-02-11 NOTE — Discharge Summary (Signed)
Discharge Summary  Gina Navarro:[id number] DOB: [birth year]  PCP: Harrison Mons, PA-C  Admit date: 01/25/16 Time of death: 3:09 pm  01/29/16  Time spent: <78mns    Discharge Diagnoses:  Active Hospital Problems   Diagnosis Date Noted  . Coronary artery disease involving coronary bypass graft of native heart with unstable angina pectoris ([facility]   . Cardiac arrest ([place] [place] [facility]   . Acute respiratory failure with hypoxia ([facility]   . Acute pulmonary edema (HCC)   . Elevated troponin   . Sinus tachycardia ([facility]   . NSTEMI (non-ST elevated myocardial infarction) ([place] 02017/09/14 . CKD (chronic kidney disease), stage III 02017/09/14 . Breast cancer metastasized to axillary lymph node ([facility] 11/02/2015  . BMI 30.0-30.9,adult 08/09/2014  . HTN (hypertension) 04/28/2012  . Hyperlipidemia LDL goal <70 04/28/2012  . Type 2 diabetes mellitus with peripheral neuropathy ([place] 04/28/2012    Resolved Hospital Problems   Diagnosis Date Noted Date Resolved  No resolved problems to display.     Filed Weights   01/21/16 0500 02/02/2016 0508 [id number]  Weight: 74.5 kg (164 lb 4.8 oz) 74.6 kg (164 lb 6.4 oz) 74.5 kg (164 lb 3.2 oz)    History of present illness:  Chief Complaint: chest pain and difficulties breathing  HPI: Gina GRAYSis a 65y.o. female with medical history significant of recently diagnosed metastatic breast cancer in June 2017 s/p chemotherapy with cyclophosphamide and doxorubicin x 4 finished 01/05/16, now was about to start cycle 1of 12 planned cycles of weekly Abraxane 12 is being brought from cancer center with complaints of chest pain and shortness of breath. Patient tells me that over the past week she has been experiencing exertional dyspnea and chest tightness "everytime she does something", and it is relieved by sitting down. Her chest pain is very predictable. She also has noticed bilateral LE swelling left >> right. She complains of occasional palpitations, most  recent episode last night. No fever or chills. She has been having a dry cough for few weeks. No abdominal pain, nausea or vomiting. Had 1-2 episodes of loose stools overnight. No lightheadedness or dizziness.  ED Course: in the ED patient has stable vital signs, slightly tachycardic to 110-120, labs pertinent for Cr 1.2, WBC 20, Hb 8.8 and a positive troponin of 0.88. CT angio of the chest without PE, bilateral pleural effusions, R>L, coronary artery calcifications. Chest XR with findings of CHF.  Patient was admitted, cardiology consulted, cardiac cath performed on 9/11 due to multivessal disease and high risk lesions, thoracic surgery consulted, but patient declined CABG, she chosed to proceed with repeat cath and coronary stenting, on 9September 18, 2024 she was seen in am report has a few episode of chest pain during the night, she did not have chest pain or sob early 909/18/24 no edema, she is continued on heparin drip, she was brought to cath lab for pci/stent placement at the end of the procedure in cath lab patient developed flash pulmonary edema, per cardiology " She became dyspneic at the conclusion of the PCI procedure. IV Lasix given for flash pulmonary edema. IV NTG started. Respiratory status worsened and she was intubated in the cath lab. Unknown cause of PEA arrest [facility] Handheld echo at the bedside by me with no evidence of pericardial effusion."  Per critical care patient was coded for >497ms " PEA cardiac arrest with ROSC to sinus but then VT and shocked multiple times then asystole and no further ROSC.  Patient  was declared dead and POA notified by cardiology."  Hospital Course:  Active Problems:   Type 2 diabetes mellitus with peripheral neuropathy (HCC)   HTN (hypertension)   Hyperlipidemia LDL goal <70   BMI 30.0-30.9,adult   Breast cancer metastasized to axillary lymph node (HCC)   NSTEMI (non-ST elevated myocardial infarction) (HCC)   CKD (chronic kidney disease), stage III    Elevated troponin   Sinus tachycardia (HCC)   Coronary artery disease involving coronary bypass graft of native heart with unstable angina pectoris ([place])   Cardiac arrest ([place])   Acute respiratory failure with hypoxia ([place])   Acute pulmonary edema (HCC)   NSTEMI , presented with chest pain, currently chest pain free -h/o CAD, as showed on CTA, patient was evaluated apparently with a stress test by Dr. Einar Gip 3-4 years ago. She would prefer not to see Dr. Einar Gip this hospitalization  -patient presented with typical symptoms with chest pain and dyspnea with activity and relieved by rest, for a week and with positive troponin  -  heparin gtt, asa, not a candidate for statin due to allergy, avoid initiating betablocker in the acute setting per cardiology recommendation, plan to cath on 9/11 ,  -cards plan to consult cardiothoracic surgery for possible CAGB, detail please see cath report on 9/11 Patient declined CABG, chose to have repeat cath with stenting.  Acute systolic CHF, worsening of chronic diastolic chf - clinically she is fluid overload, with pulmonary edema on chest x ray, lower extremity edema, DOE, now cough , worse at night - 2D echo pending - concern for ischemic cardiomyopathy vs doxorubicin induced as she just completed chemo 2 weeks ago -trial of iv lasixx1 on 9/9, on 9/10 lower extremity edema has resolved, reported less cough. Cr improving from 1.22 on 9/9 to 1.09 on 9/10.   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 repeat A1C 8.0  on 9/8   HLD - ldl 112, triglyceride 178 -allergic to statins,   H/o HTN -   not on bp meds at home  State IIIA inavasive ductal carcinoma, ER+PR+ breast cancer ,under neoadjuvant chemotheray last chemo with doxorubicin/cytoxan on 8/25 with supportive GCSF injection  per Oncologist Dr Jana Hakim plan to finish 4cycles of Ac then,  paclitazel x2, then surgery, then adjuvant XRT.  CTA chest this admission showed : Enlarged right peritracheal and right sub carinal lymph nodes are noted concerning for possible metastatic disease.  Right axillary adenopathy noted on prior exam is significantly smaller in size currently, consistent with improving metastatic disease.  I have discussed result with Dr Jana Hakim on Monday (9/11), Dr Jana Hakim is aware of the CTA results.     Code Status:Full Family Communication:no family bedside, cardiology talked to patient sister in law Blaire (her [place])   Consultants:  Cardiology  Oncology  Cardiothoracic surgery  Critical care  Procedures:  Cardiac cath on 9/11 and Feb 10, 2023  Cpr/intubation on 02/10/2023  Antibiotics:  none  Death was declared by critical care Dr Nelda Marseille and cardiology Dr Angelena Form      Medication List    STOP taking these medications   BAYER CONTOUR MONITOR w/Device Kit   canagliflozin 300 MG Tabs tablet Commonly known as:  INVOKANA   cholecalciferol 1000 units tablet Commonly known as:  VITAMIN D   glucosamine-chondroitin 500-400 MG tablet   glucose blood test strip Commonly known as:  BAYER CONTOUR  NEXT TEST   guaiFENesin 600 MG 12 hr tablet Commonly known as:  MUCINEX   lidocaine-prilocaine cream Commonly known as:  EMLA   loratadine 10 MG tablet Commonly known as:  CLARITIN   metFORMIN 1000 MG tablet Commonly known as:  GLUCOPHAGE   nitroGLYCERIN 0.4 MG SL tablet Commonly known as:  NITROSTAT      Allergies  Allergen Reactions  . Bee Venom   . Statins Other (See Comments)    MYALGIAS with Crestor, Zocor. Tolerating Lipitor (05/2013)  . Victoza [Liraglutide]     Dizziness, peripheral neuropathy, constipation, heart palpitations      The results of significant diagnostics from this hospitalization (including imaging, microbiology, ancillary and laboratory) are listed below for reference.    Significant Diagnostic  Studies: Dg Chest 2 View  Result Date: 01/30/2016 CLINICAL DATA:  Shortness of breath and cough for 5 days for history of breast carcinoma. EXAM: CHEST  2 VIEW COMPARISON:  Chest CT November 17, 2015 FINDINGS: There is interstitial edema, primarily in the mid and lower lung zones bilaterally. There is a minimal pleural effusion on each side. There is mild cardiomegaly with pulmonary venous hypertension. Port-A-Cath tip is in the superior vena cava. No adenopathy. No bone lesions evident. There is calcification in the right carotid artery. IMPRESSION: Findings most consistent with a degree of congestive heart failure. It should be noted that lymphangitic spread of tumor could present in this manner and must be considered a differential consideration given history of breast carcinoma. Both entities could exist concurrently. There is no frank airspace consolidation. Port-A-Cath tip in superior vena cava. Right carotid artery calcification noted. Electronically Signed   By: Lowella Grip III M.D.   On: 01/16/2016 10:08   Ct Angio Chest Pe W And/or Wo Contrast  Result Date: 01/24/2016 CLINICAL DATA:  Chest pain, shortness of breath. History of breast cancer. EXAM: CT ANGIOGRAPHY CHEST WITH CONTRAST TECHNIQUE: Multidetector CT imaging of the chest was performed using the standard protocol during bolus administration of intravenous contrast. Multiplanar CT image reconstructions and MIPs were obtained to evaluate the vascular anatomy. CONTRAST:  52 mL of Isovue 370 intravenously. COMPARISON:  CT scan of November 17, 2015. FINDINGS: No pneumothorax is noted. Mild bilateral pleural effusions are noted, with right greater than left. Bibasilar interstitial densities are noted most consistent with pulmonary edema. There is no definite evidence of pulmonary embolus. Atherosclerosis of thoracic aorta is noted without aneurysm or dissection. Coronary artery calcifications are noted. 8 mm right peritracheal lymph node is noted which  is significantly increased compared to prior exam. Also noted is 18 mm right sub carinal lymph node concerning for possible metastatic disease. 15 mm right axillary lymph node is noted which is decreased in size compared to prior exam but concerning for metastatic disease. There is been interval placement of right internal jugular Port-A-Cath with distal tip in expected position of cavoatrial junction. No definite abnormality is seen within the visualized portion of the upper abdomen. Multilevel degenerative disc disease is noted in the lower thoracic spine. Review of the MIP images confirms the above findings. IMPRESSION: No definite evidence of pulmonary embolus. Aortic atherosclerosis. Mild bilateral pleural effusions are noted, with right greater than left. Bibasilar interstitial densities are noted most consistent with pulmonary edema. Coronary artery calcifications are noted suggesting coronary disease. Enlarged right peritracheal and right sub carinal lymph nodes are noted concerning for possible metastatic disease. Right axillary adenopathy noted on prior exam is significantly smaller in size currently, consistent with  improving metastatic disease. Electronically Signed   By: Marijo Conception, M.D.   On: 02/05/2016 12:39    Microbiology: Recent Results (from the past 240 hour(s))  Urine culture     Status: Abnormal   Collection Time: 01/20/16  1:29 PM  Result Value Ref Range Status   Specimen Description URINE, RANDOM  Final   Special Requests NONE  Final   Culture MULTIPLE SPECIES PRESENT, SUGGEST RECOLLECTION (A)  Final   Report Status 01/21/2016 FINAL  Final     Labs: Basic Metabolic Panel:  Recent Labs Lab 01/30/2016 1104 02/05/2016 1114 01/20/16 0527 01/21/16 0618 01/14/2016 0605 February 05, 2016 1042  NA 138 137 140 139 141 139  K 4.2 4.1 4.0 3.7 3.8 3.6  CL 106 105 106 107 108 107  CO2 18*  --  20* 18* 23 21*  GLUCOSE 185* 178* 134* 131* 176* 208*  BUN 23* 19 25* 26* 23* 16  CREATININE  1.21* 1.10* 1.22* 1.09* 1.03* 1.04*  CALCIUM 8.8*  --  8.5* 8.7* 8.9 9.2  MG  --   --   --  2.2  --   --    Liver Function Tests:  Recent Labs Lab 02/05/2016 0819 02/01/2016 1104  AST 16 20  ALT 24 26  ALKPHOS 83 82  BILITOT 0.84 1.1  PROT 6.6 6.9  ALBUMIN 3.2* 3.8    Recent Labs Lab 01/20/2016 1104  LIPASE 21   No results for input(s): AMMONIA in the last 168 hours. CBC:  Recent Labs Lab 02/03/2016 0819  02/05/2016 1104 01/17/2016 1114 01/20/16 0527 01/21/16 0618 02/09/2016 0605 02-05-2016 1042  WBC 15.7*  --  20.3*  --  11.3* 8.6 7.5 10.7*  NEUTROABS 14.7*  --   --   --   --   --   --   --   HGB 8.0*  < > 8.5* 8.8* 7.4* 8.0* 8.2* 9.0*  HCT 24.7*  < > 25.4* 26.0* 23.4* 25.6* 25.8* 29.3*  MCV 91.3  --  90.7  --  92.5 94.8 93.1 94.5  PLT 275  --  295  --  287 356 379 424*  < > = values in this interval not displayed. Cardiac Enzymes:  Recent Labs Lab 01/30/2016 1420 01/20/16 0527  TROPONINI 1.22* 1.55*   BNP: BNP (last 3 results) No results for input(s): BNP in the last 8760 hours.  ProBNP (last 3 results) No results for input(s): PROBNP in the last 8760 hours.  CBG:  Recent Labs Lab 01/17/2016 1150 02/07/2016 1637 01/15/2016 2052 02/05/16 0731 2016-02-05 1136  GLUCAP 153* 166* 242* 198* 196*       Signed:  Meri Pelot MD, PhD  [facility] 02/05/16, 7:42 PM

## 2016-02-11 NOTE — Progress Notes (Signed)
Patient Name: Gina Navarro Date of Encounter: 02/06/16  Active Problems:   Type 2 diabetes mellitus with peripheral neuropathy (HCC)   HTN (hypertension)   Hyperlipidemia LDL goal <70   BMI 30.0-30.9,adult   Breast cancer metastasized to axillary lymph node (HCC)   NSTEMI (non-ST elevated myocardial infarction) ([place])   CKD (chronic kidney disease), stage III   Elevated troponin   Sinus tachycardia [facility])   Primary Cardiologist: Dr. Harrington Challenger Patient Profile: Gina Navarro is a 65 year old female with a history of DM, CKD III, HTN, and recently diagnosed breast CA who presented with NSTEMI. Cath revealed severe 3V disease. She does not want to proceed with CABG, as it would interrupt her treatment of her breast CA.   SUBJECTIVE: Feels ok, no chest pain. Had SOB last night that resolved with Lasix.   OBJECTIVE Vitals:   02/04/2016 1155 01/18/2016 1441 01/31/2016 2054 02/06/16 0545  BP: 133/64  120/61 111/65  Pulse: (!) 116  (!) 111 (!) 104  Resp: (!) 30     Temp:  98.2 F (36.8 C) 98.4 F (36.9 C) 97.7 F (36.5 C)  TempSrc:  Oral Oral Oral  SpO2: 96%  95% 98%  Weight:    164 lb 3.2 oz (74.5 kg)  Height:        Intake/Output Summary (Last 24 hours) at 02/06/16 0755 Last data filed at 2016/02/06 0600  Gross per 24 hour  Intake              154 ml  Output             1800 ml  Net            -1646 ml   Filed Weights   01/21/16 0500 01/31/2016 0508 2016/02/06 0545  Weight: 164 lb 4.8 oz (74.5 kg) 164 lb 6.4 oz (74.6 kg) 164 lb 3.2 oz (74.5 kg)    PHYSICAL EXAM General: Well developed, well nourished, female in no acute distress. Head: Normocephalic, atraumatic.  Neck: Supple without bruits, no JVD. Lungs:  Resp regular and unlabored, diminished in bases.  Heart: RRR, S1, S2, no S3, S4, or murmur; no rub. Abdomen: Soft, non-tender, non-distended, BS + x 4.  Extremities: No clubbing, cyanosis, no edema.  Neuro: Alert and oriented X 3. Moves all extremities spontaneously. Psych:  Normal affect.  LABS: CBC: Recent Labs  01/21/16 0618 02/05/2016 0605  WBC 8.6 7.5  HGB 8.0* 8.2*  HCT 25.6* 25.8*  MCV 94.8 93.1  PLT 356 379   INR: Recent Labs  01/21/16 1059  INR [id number]   Basic Metabolic Panel: Recent Labs  01/21/16 0618 01/15/2016 0605  NA 139 141  K 3.7 3.8  CL 107 108  CO2 18* 23  GLUCOSE 131* 176*  BUN 26* 23*  CREATININE 1.09* 1.03*  CALCIUM 8.7* 8.9  MG 2.2  --      Current Facility-Administered Medications:  .  0.9 %  sodium chloride infusion, 250 mL, Intravenous, PRN, Burnell Blanks, MD .  acetaminophen (TYLENOL) tablet 650 mg, 650 mg, Oral, Q4H PRN, Caren Griffins, MD .  aspirin EC tablet 81 mg, 81 mg, Oral, Daily, Caren Griffins, MD, 81 mg at 01/21/16 0810 .  heparin ADULT infusion 100 units/mL (25000 units/258mL sodium chloride 0.45%), 1,400 Units/hr, Intravenous, Continuous, Lyndee Leo, [facility], Last Rate: 14 mL/hr at 01/19/2016 1900, 1,400 Units/hr at 02/02/2016 1900 .  insulin aspart (novoLOG) injection 0-9 Units, 0-9 Units, Subcutaneous, TID WC,  Caren Griffins, MD, 2 Units at 02/02/2016 1700 .  nitroGLYCERIN (NITROSTAT) SL tablet 0.4 mg, 0.4 mg, Sublingual, Q5 Min x 3 PRN, Costin Karlyne Greenspan, MD .  ondansetron (ZOFRAN) injection 4 mg, 4 mg, Intravenous, Q6H PRN, Costin Karlyne Greenspan, MD .  sodium chloride flush (NS) 0.9 % injection 3 mL, 3 mL, Intravenous, Q12H, Burnell Blanks, MD, 3 mL at 02/09/2016 1515 .  sodium chloride flush (NS) 0.9 % injection 3 mL, 3 mL, Intravenous, PRN, Burnell Blanks, MD . heparin 1,400 Units/hr (01/19/2016 1900)      TELE:   Sinus tach     ECG: Sinus tach     Left Heart Cath and Coronary Angiography 01/15/2016  Prox RCA lesion, 80 %stenosed.  Mid RCA lesion, 80 %stenosed.  RPDA lesion, 30 %stenosed.  Dist RCA lesion, 30 %stenosed.  Ost LM to LM lesion, 50 %stenosed.  Ost Cx to Prox Cx lesion, 60 %stenosed.  Ost LAD to Prox LAD lesion, 90 %stenosed.  Dist LAD lesion, 50  %stenosed.  Mid LAD lesion, 90 %stenosed.   1. 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  2. The left main artery appears to have diffuse moderate disease. The catheter dampens upon engagement of the left main artery 3. The proximal LAD appears to have an ulcerated severe stenosis. The remainder of the mid and distal LAD has diffuse disease and become small caliber beyond the mid vessel.  4. The ostial Circumflex has a moderate stenosis.  5. The proximal and mid RCA has moderately severe stenosis.   Recommendations: This is a complex clinical scenario. She has metastatic breast cancer and is undergoing chemotherapy. She will need breast surgery in the next 3-4 months. She is now admitted with a NSTEMI and has diffuse three vessel CAD. The proximal LAD lesion is likely an ulcerated plaque and her culprit lesion. There is at least moderate disease in her short left main artery as well as moderately severe disease in the ostial Circumflex and several segments of the RCA. The proximal LAD could be addressed with PCI/stenting but this is complicated by the fact that she will need to have anti-platelet therapy interrupted in several months for her breast surgery. Placing a bare metal stent in the moderate caliber proximal LAD would not be optimal in regards to restenosis risk. A drug eluting stent in the proximal LAD would be high risk for thrombosis if anti-platelet therapy were stopped in 3 months. The distal LAD becomes small, is diffusely diseased and is not a great target for bypass. The distal RCA is a favorable target for bypass.  I will ask the CT surgery team to see her to discuss CABG. If she is not felt to be a good candidate for CABG, will have to discuss options for PCI/stenting of the LAD and RCA. As above, multi-vessel stenting is not an optimal approach due to the need to stop anti-platelet therapy in several months  as well as the location of the disease in the left main, ostial Circumflex and LAD.    Current Medications:  . aspirin EC  81 mg Oral Daily  . insulin aspart  0-9 Units Subcutaneous TID WC  . sodium chloride flush  3 mL Intravenous Q12H   . heparin 1,400 Units/hr (02/04/2016 1900)    ASSESSMENT AND PLAN: Active Problems:   Type 2 diabetes mellitus with peripheral neuropathy (HCC)   HTN (hypertension)   Hyperlipidemia  LDL goal <70   BMI 30.0-30.9,adult   Breast cancer metastasized to axillary lymph node (HCC)   NSTEMI (non-ST elevated myocardial infarction) (HCC)   CKD (chronic kidney disease), stage III   Elevated troponin   Sinus tachycardia ([place])  1. NSTEMI with suspected acute CHF -  Left heart cath report above. She has triple vessel CAD with involvement of the left main, proximal LAD, proximal Circumflex, and proximal RCA and mid RCA.   She was seen by CVTS and is adamant that she does not want CABG as it will interrupt her breast CA treatment. Can consider PCI to LAD and RCA.   She is tachycardic this am, would add Coreg 3.125mg  BID. Echo showed EF has declined to 40-45%.Will need to add ACE-I, will need to wait until after beta blocker is added.   2. CKD III - SCr 1.03 today, stable post CT and cath.   3. Breast CA - may need oncology input regarding peritracheal/subcarinal findings on CT - will defer to primary team to review.  4. HTN - See discussion above. Will need to add BB today.  5. Anemia: Hgb 8.8-->7.4.-->8-->8.2Follow.   Signed, Arbutus Leas , NP 7:55 AM 2016-02-06 Pager [phone]

## 2016-02-11 NOTE — Progress Notes (Signed)
ANTICOAGULATION CONSULT NOTE - Follow Up Consult  Pharmacy Consult for Heparin Indication: chest pain/ACS   Assessment: LA is a 9 yoF who was admitted on 9/8 for NSTEMI and found to have multivessel CAD from 9/11 cath procedure. Heparin level is therapeutic today at 0.45. Hemoglobin and platelets are stable with no bleeding noted.  Patient is scheduled for PCI today as her preference is to not undergo CABG at this time.   Goal of Therapy:  Heparin level 0.3-0.7 units/ml Monitor platelets by anticoagulation protocol: Yes   Plan:  Continue heparin 1400 units/hr Monitor daily HL, CBC, s/sx's of bleeding Follow-up post-PCI anticoagulation plans    Allergies  Allergen Reactions  . Bee Venom   . Statins Other (See Comments)    MYALGIAS with Crestor, Zocor. Tolerating Lipitor (05/2013)  . Victoza [Liraglutide]     Dizziness, peripheral neuropathy, constipation, heart palpitations    Patient Measurements: Height: 5\' 4"  (162.6 cm) Weight: 164 lb 3.2 oz (74.5 kg) IBW/kg (Calculated) : 54.7 Heparin Dosing Weight: 71.2 kg  Vital Signs: Temp: 97.7 F (36.5 C) (09/12 0545) Temp Source: Oral (09/12 0545) BP: 111/65 (09/12 0545) Pulse Rate: 104 (09/12 0545)  Labs:  Recent Labs  01/21/16 0618 01/21/16 1059 02/10/2016 0605 02-22-16 1042  HGB 8.0*  --  8.2* 9.0*  HCT 25.6*  --  25.8* 29.3*  PLT 356  --  379 424*  LABPROT  --  13.4  --   --   INR  --  1.02  --   --   HEPARINUNFRC 0.52  --  0.48 0.45  CREATININE 1.09*  --  1.03*  --     Estimated Creatinine Clearance: 55.2 mL/min (by C-G formula based on SCr of 1.03 mg/dL).   Medications:  Infusions:  . sodium chloride 50 mL/hr at 2016-02-22 0856  . heparin 1,400 Units/hr (01/18/2016 1900)    Belia Heman, PharmD PGY1 Pharmacy Resident [phone] (Pager) 2016/02/22 11:21 AM

## 2016-02-11 NOTE — Progress Notes (Signed)
Results of cath noted. Please be aware we do NOT have confirmed metastatic/stage IV disease in this patient. There are some LNs outside the locoregional field which may or may not be metastatic. There is no evidence of lung, liver or bone lesions. She is being treated with curative intent  Accordingly my recommendation is that the CV disease be treated optimally, whatever plan seems best to the CV team. We can modify/postpone treatment for her breast cancer as needed to accommodate the mode immediately threatening CV disease.  Will follow with you.

## 2016-02-11 NOTE — Interval H&P Note (Signed)
History and Physical Interval Note:  02/03/2016 11:48 AM  Gina Navarro  has presented today for cardiac cath/PCI with the diagnosis of cad/nstemi.  The various methods of treatment have been discussed with the patient and family. After consideration of risks, benefits and other options for treatment, the patient has consented to  Procedure(s): Coronary Stent Intervention (N/A) as a surgical intervention .  The patient's history has been reviewed, patient examined, no change in status, stable for surgery.  I have reviewed the patient's chart and labs.  Questions were answered to the patient's satisfaction.    Cath Lab Visit (complete for each Cath Lab visit)  Clinical Evaluation Leading to the Procedure:   ACS: Yes.    Non-ACS:    Anginal Classification: CCS III  Anti-ischemic medical therapy: Minimal Therapy (1 class of medications)  Non-Invasive Test Results: No non-invasive testing performed  Prior CABG: No previous CABG         Lauree Chandler

## 2016-02-11 NOTE — Progress Notes (Signed)
Interventional Cardiology Note:   The patient was admitted with NSTEMI. Cardiac cath 02/07/2016 with severe disease in the proximal LAD and proximal/mid RCA with moderate disease in the left main artery, throughout the LAD and in the proximal Circumflex. Her anatomy is clearly optimally treated with CABG. She has been seen by Dr. Servando Snare with CT surgery and is felt to be a reasonable candidate for CABG but the patient refuses to consider CABG with ongoing treatment for cancer.   I have discussed PCI/stenting of the LAD and RCA as an option, although not the best option. She wishes to pursue this route which will temporize her CAD for now while she can complete treatment for her cancer. My plan is to place drug eluting coronary stents. She will need at least 6 months of dual antiplatelet therapy before it can be stopped for breast cancer surgery. She understands this.   Plan PCI later today.   Lauree Chandler Jan 25, 2016 8:08 AM

## 2016-02-11 DEATH — deceased

## 2016-02-19 ENCOUNTER — Ambulatory Visit (HOSPITAL_COMMUNITY): Payer: BLUE CROSS/BLUE SHIELD | Admitting: Internal Medicine

## 2016-02-19 ENCOUNTER — Other Ambulatory Visit (HOSPITAL_COMMUNITY): Payer: BLUE CROSS/BLUE SHIELD

## 2016-02-20 ENCOUNTER — Ambulatory Visit: Payer: BLUE CROSS/BLUE SHIELD | Admitting: Physician Assistant

## 2016-04-19 ENCOUNTER — Other Ambulatory Visit: Payer: Self-pay | Admitting: Nurse Practitioner

## 2016-04-20 ENCOUNTER — Other Ambulatory Visit: Payer: Self-pay | Admitting: Nurse Practitioner

## 2016-04-26 NOTE — Telephone Encounter (Signed)
error 

## 2016-04-26 NOTE — Telephone Encounter (Signed)
No entry 

## 2017-05-08 IMAGING — CT CT ANGIO CHEST
3 of 7 series · 18 of 36 positions shown · IV contrast (isovue)
Comparison: CT scan of November 17, 2015.

CLINICAL DATA: Chest pain, shortness of breath. History of breast
cancer.

EXAM:
CT ANGIOGRAPHY CHEST WITH CONTRAST
TECHNIQUE: Multidetector CT imaging of the chest was performed using the
standard protocol during bolus administration of intravenous
contrast. Multiplanar CT image reconstructions and MIPs were
obtained to evaluate the vascular anatomy.
CONTRAST:  52 mL of Isovue 370 intravenously.

[Series 5: coronal mpr · coronal · 0.64mm/px · 1 of 110 slices shown]
[im 55/110  mediastinal]
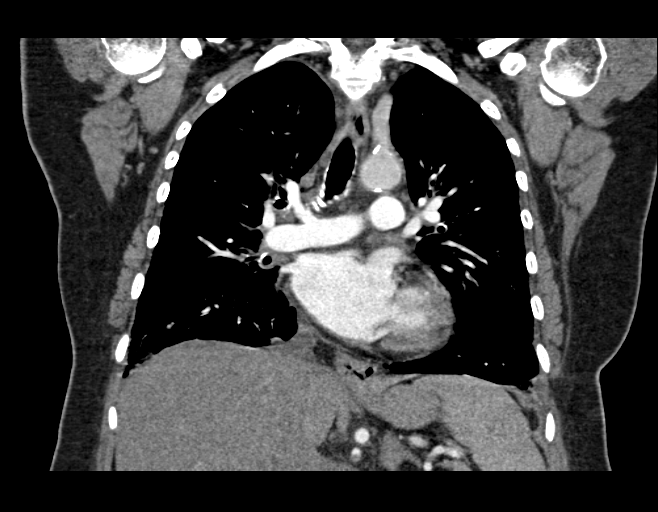

[Series 10: thins for pacs · axial · 0.76mm/px · z∈[+1447,+1688]mm · 15 of 277 slices shown]
[im 18/277  lung]
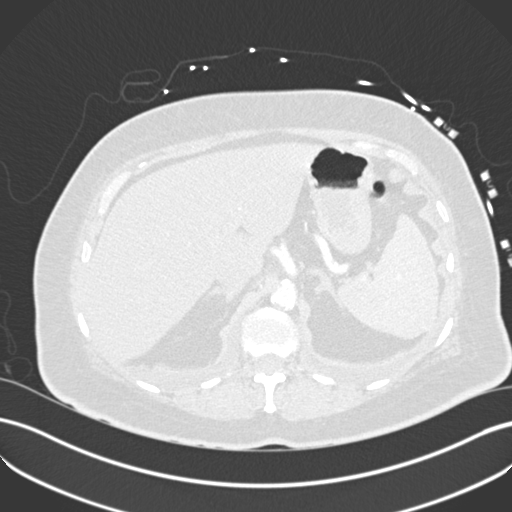
[im 35/277  mediastinal]
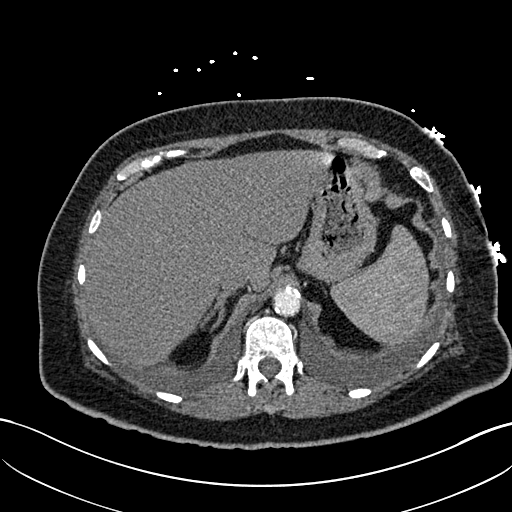
[im 52/277  lung]
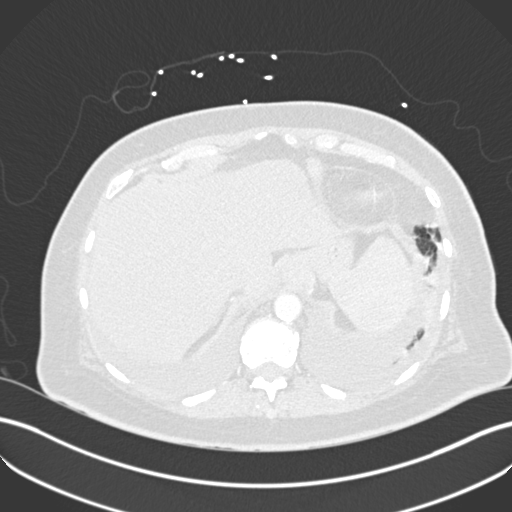
[im 70/277  mediastinal]
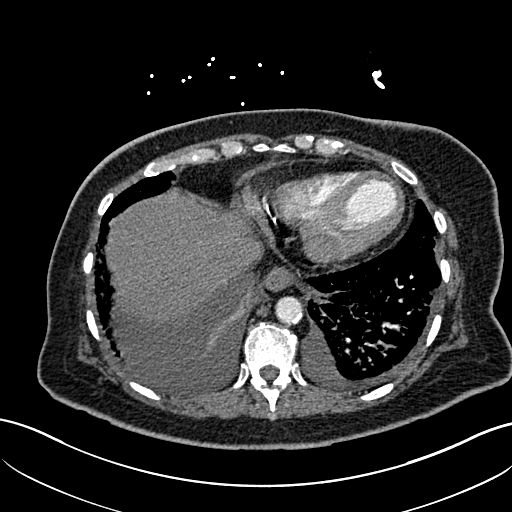
[im 87/277  lung]
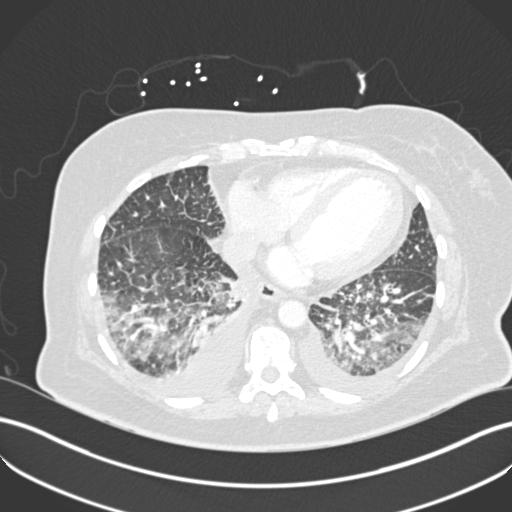
[im 104/277  mediastinal]
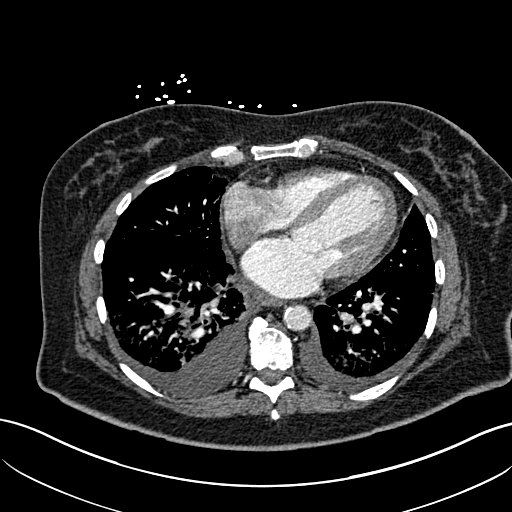
[im 121/277  lung]
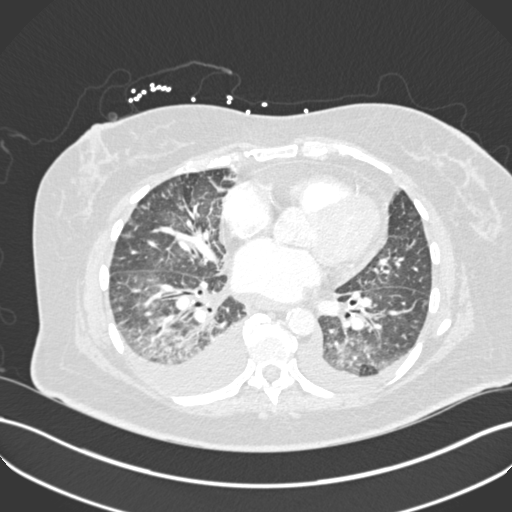
[im 139/277  mediastinal]
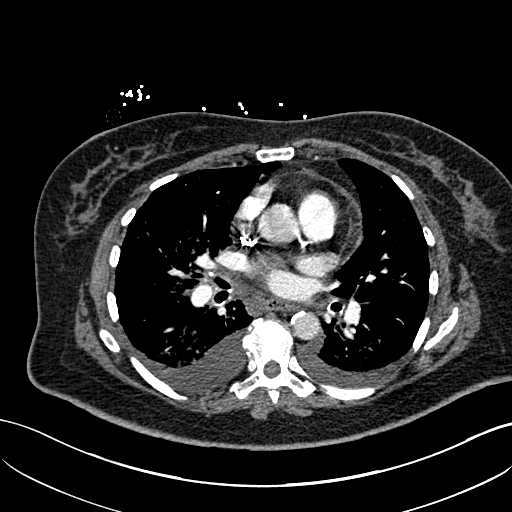
[im 156/277  lung]
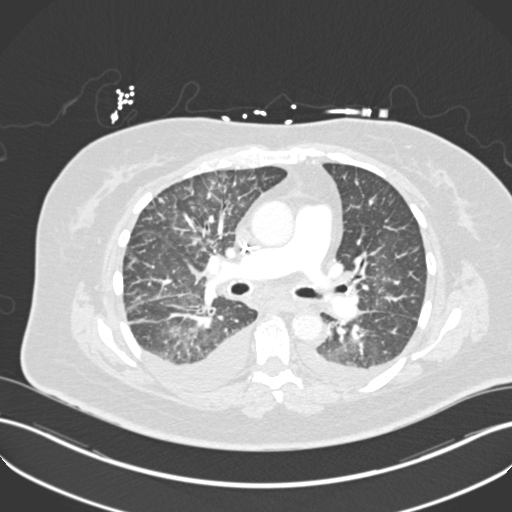
[im 173/277  mediastinal]
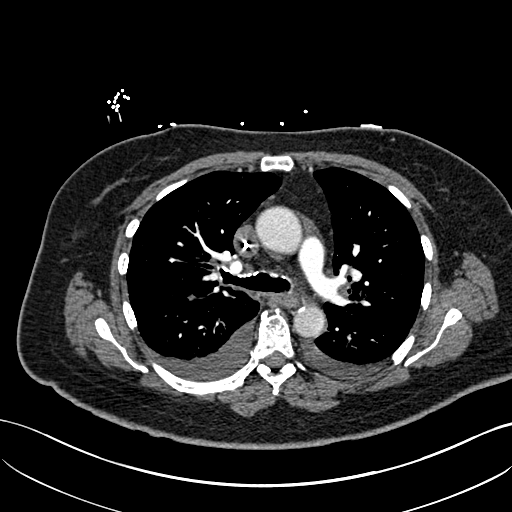
[im 190/277  lung]
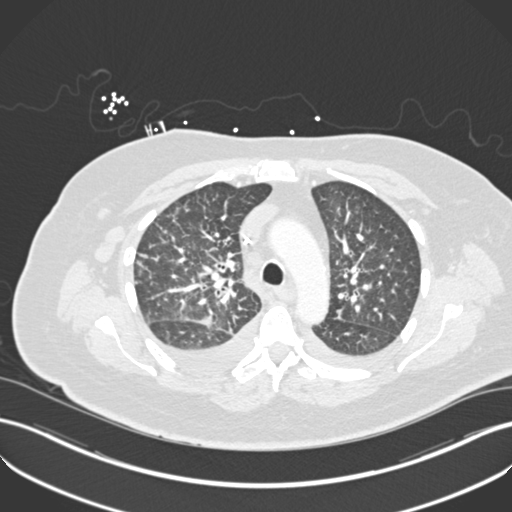
[im 208/277  mediastinal]
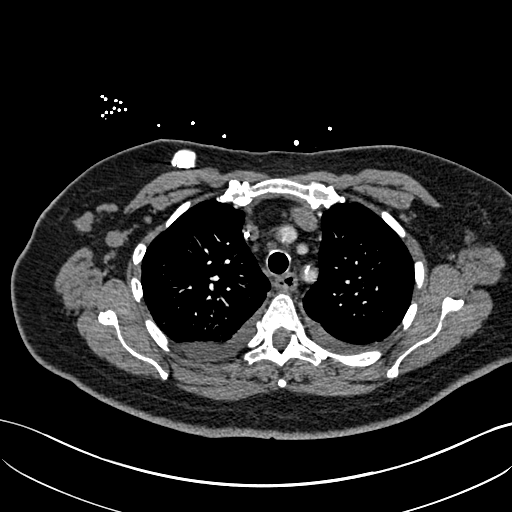
[im 225/277  lung]
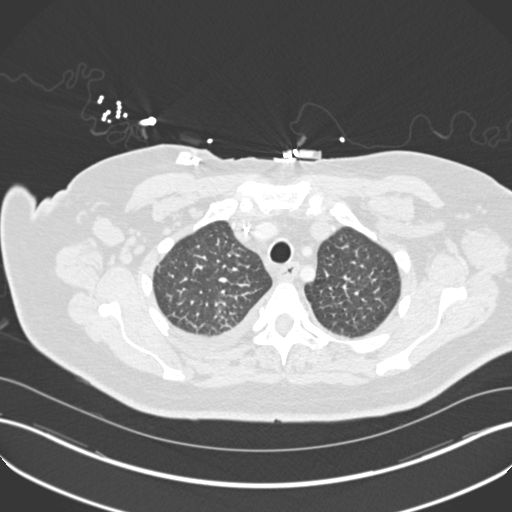
[im 242/277  mediastinal]
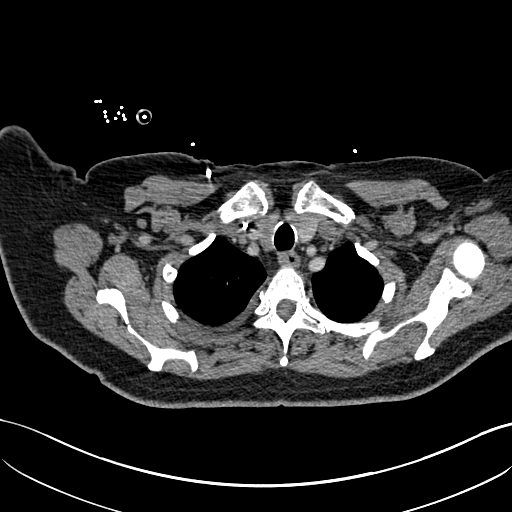
[im 259/277  lung]
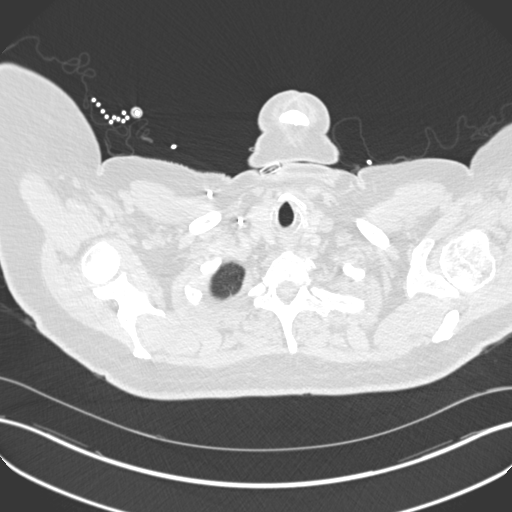

[Series 11: lung windows · axial · 0.76mm/px · z∈[+1522,+1582]mm · 2 of 80 slices shown]
[im 20/80  mediastinal]
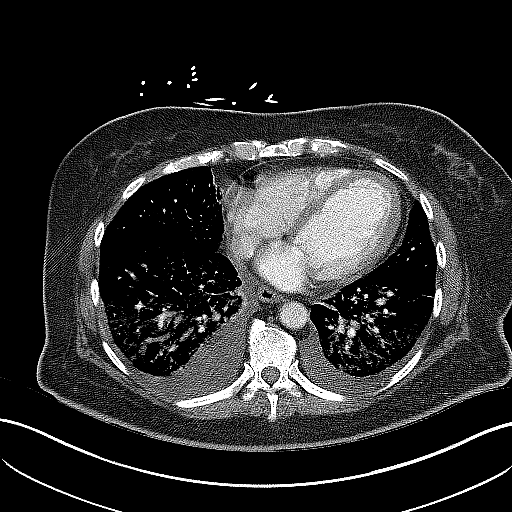
[im 40/80  mediastinal]
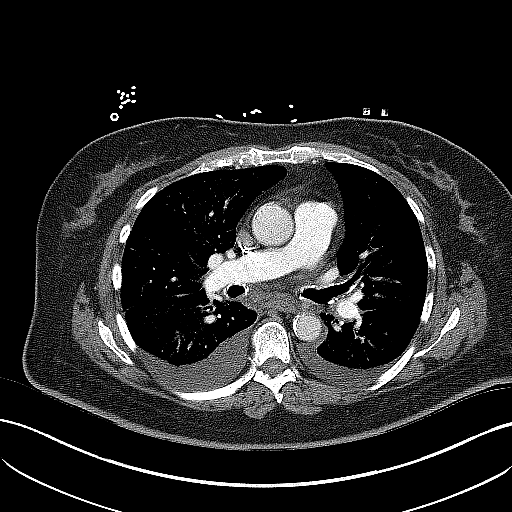

[18 of 36 positions shown; findings below may reference images not displayed]

FINDINGS: No pneumothorax is noted. Mild bilateral pleural effusions are
noted, with right greater than left. Bibasilar interstitial
densities are noted most consistent with pulmonary edema. There is
no definite evidence of pulmonary embolus. Atherosclerosis of
thoracic aorta is noted without aneurysm or dissection. Coronary
artery calcifications are noted. 8 mm right peritracheal lymph node
is noted which is significantly increased compared to prior exam.
Also noted is 18 mm right sub carinal lymph node concerning for
possible metastatic disease. 15 mm right axillary lymph node is
noted which is decreased in size compared to prior exam but
concerning for metastatic disease. There is been interval placement
of right internal jugular Port-A-Cath with distal tip in expected
position of cavoatrial junction. No definite abnormality is seen
within the visualized portion of the upper abdomen. Multilevel
degenerative disc disease is noted in the lower thoracic spine.

Review of the MIP images confirms the above findings.
IMPRESSION: No definite evidence of pulmonary embolus.

Aortic atherosclerosis.

Mild bilateral pleural effusions are noted, with right greater than
left.

Bibasilar interstitial densities are noted most consistent with
pulmonary edema.

Coronary artery calcifications are noted suggesting coronary
disease.

Enlarged right peritracheal and right sub carinal lymph nodes are
noted concerning for possible metastatic disease.

Right axillary adenopathy noted on prior exam is significantly
smaller in size currently, consistent with improving metastatic
disease.
